# Patient Record
Sex: Female | Born: 1935 | Race: White | Hispanic: No | Smoking: Never smoker
Health system: Southern US, Community
[De-identification: ages and names within clinical notes are randomized; demographics above are authoritative.]

## PROBLEM LIST (undated history)

## (undated) DIAGNOSIS — M25512 Pain in left shoulder: Secondary | ICD-10-CM

## (undated) DIAGNOSIS — M47816 Spondylosis without myelopathy or radiculopathy, lumbar region: Secondary | ICD-10-CM

## (undated) DIAGNOSIS — H18509 Unspecified hereditary corneal dystrophies, unspecified eye: Secondary | ICD-10-CM

## (undated) DIAGNOSIS — T50Z95A Adverse effect of other vaccines and biological substances, initial encounter: Secondary | ICD-10-CM

## (undated) DIAGNOSIS — E039 Hypothyroidism, unspecified: Secondary | ICD-10-CM

## (undated) DIAGNOSIS — M1990 Unspecified osteoarthritis, unspecified site: Secondary | ICD-10-CM

## (undated) DIAGNOSIS — I10 Essential (primary) hypertension: Secondary | ICD-10-CM

## (undated) DIAGNOSIS — E785 Hyperlipidemia, unspecified: Secondary | ICD-10-CM

## (undated) DIAGNOSIS — Z9289 Personal history of other medical treatment: Secondary | ICD-10-CM

## (undated) DIAGNOSIS — H1850 Unspecified hereditary corneal dystrophies: Secondary | ICD-10-CM

## (undated) DIAGNOSIS — H04129 Dry eye syndrome of unspecified lacrimal gland: Secondary | ICD-10-CM

## (undated) DIAGNOSIS — K5790 Diverticulosis of intestine, part unspecified, without perforation or abscess without bleeding: Secondary | ICD-10-CM

## (undated) DIAGNOSIS — H524 Presbyopia: Secondary | ICD-10-CM

## (undated) DIAGNOSIS — H3530 Unspecified macular degeneration: Secondary | ICD-10-CM

## (undated) HISTORY — DX: Unspecified macular degeneration: H35.30

## (undated) HISTORY — DX: Dry eye syndrome of unspecified lacrimal gland: H04.129

## (undated) HISTORY — PX: CATARACT EXTRACTION, BILATERAL: SHX1313

## (undated) HISTORY — DX: Presbyopia: H52.4

## (undated) HISTORY — DX: Hypothyroidism, unspecified: E03.9

## (undated) HISTORY — DX: Essential (primary) hypertension: I10

## (undated) HISTORY — DX: Hyperlipidemia, unspecified: E78.5

## (undated) HISTORY — PX: COLONOSCOPY: SHX174

## (undated) HISTORY — DX: Unspecified hereditary corneal dystrophies, unspecified eye: H18.509

## (undated) HISTORY — DX: Unspecified osteoarthritis, unspecified site: M19.90

## (undated) HISTORY — DX: Personal history of other medical treatment: Z92.89

## (undated) HISTORY — DX: Unspecified hereditary corneal dystrophies: H18.50

## (undated) HISTORY — DX: Pain in left shoulder: M25.512

## (undated) HISTORY — PX: COLECTOMY: SHX59

## (undated) HISTORY — DX: Diverticulosis of intestine, part unspecified, without perforation or abscess without bleeding: K57.90

## (undated) HISTORY — DX: Adverse effect of other vaccines and biological substances, initial encounter: T50.Z95A

## (undated) HISTORY — PX: OTHER SURGICAL HISTORY: SHX169

---

## 1957-11-05 DIAGNOSIS — Z9289 Personal history of other medical treatment: Secondary | ICD-10-CM

## 1957-11-05 HISTORY — PX: APPENDECTOMY: SHX54

## 1957-11-05 HISTORY — DX: Personal history of other medical treatment: Z92.89

## 1973-11-05 HISTORY — PX: VAGINAL HYSTERECTOMY: SUR661

## 1998-05-05 ENCOUNTER — Ambulatory Visit (HOSPITAL_COMMUNITY): Admission: RE | Admit: 1998-05-05 | Discharge: 1998-05-05 | Payer: Self-pay | Admitting: Family Medicine

## 1999-05-22 ENCOUNTER — Other Ambulatory Visit: Admission: RE | Admit: 1999-05-22 | Discharge: 1999-05-22 | Payer: Self-pay | Admitting: Family Medicine

## 2000-05-23 ENCOUNTER — Other Ambulatory Visit: Admission: RE | Admit: 2000-05-23 | Discharge: 2000-05-23 | Payer: Self-pay | Admitting: Family Medicine

## 2000-08-12 ENCOUNTER — Encounter: Payer: Self-pay | Admitting: Family Medicine

## 2000-08-12 ENCOUNTER — Encounter: Admission: RE | Admit: 2000-08-12 | Discharge: 2000-08-12 | Payer: Self-pay | Admitting: Family Medicine

## 2001-05-27 ENCOUNTER — Other Ambulatory Visit: Admission: RE | Admit: 2001-05-27 | Discharge: 2001-05-27 | Payer: Self-pay | Admitting: Family Medicine

## 2001-08-13 ENCOUNTER — Encounter: Payer: Self-pay | Admitting: Family Medicine

## 2001-08-13 ENCOUNTER — Encounter: Admission: RE | Admit: 2001-08-13 | Discharge: 2001-08-13 | Payer: Self-pay | Admitting: Family Medicine

## 2001-10-07 ENCOUNTER — Encounter: Payer: Self-pay | Admitting: Family Medicine

## 2001-10-07 ENCOUNTER — Encounter: Admission: RE | Admit: 2001-10-07 | Discharge: 2001-10-07 | Payer: Self-pay | Admitting: Family Medicine

## 2001-11-14 ENCOUNTER — Inpatient Hospital Stay (HOSPITAL_COMMUNITY): Admission: EM | Admit: 2001-11-14 | Discharge: 2001-11-20 | Payer: Self-pay | Admitting: Emergency Medicine

## 2001-11-14 ENCOUNTER — Encounter: Payer: Self-pay | Admitting: Gastroenterology

## 2001-11-19 ENCOUNTER — Encounter: Payer: Self-pay | Admitting: Gastroenterology

## 2001-12-01 ENCOUNTER — Encounter: Payer: Self-pay | Admitting: Gastroenterology

## 2001-12-01 ENCOUNTER — Ambulatory Visit (HOSPITAL_COMMUNITY): Admission: RE | Admit: 2001-12-01 | Discharge: 2001-12-01 | Payer: Self-pay | Admitting: Gastroenterology

## 2001-12-24 ENCOUNTER — Encounter: Admission: RE | Admit: 2001-12-24 | Discharge: 2002-01-02 | Payer: Self-pay | Admitting: *Deleted

## 2001-12-29 ENCOUNTER — Encounter (INDEPENDENT_AMBULATORY_CARE_PROVIDER_SITE_OTHER): Payer: Self-pay

## 2001-12-29 ENCOUNTER — Inpatient Hospital Stay (HOSPITAL_COMMUNITY): Admission: RE | Admit: 2001-12-29 | Discharge: 2002-01-03 | Payer: Self-pay | Admitting: *Deleted

## 2002-05-28 ENCOUNTER — Other Ambulatory Visit: Admission: RE | Admit: 2002-05-28 | Discharge: 2002-05-28 | Payer: Self-pay | Admitting: Family Medicine

## 2002-07-23 ENCOUNTER — Encounter: Payer: Self-pay | Admitting: Family Medicine

## 2002-07-23 ENCOUNTER — Encounter: Admission: RE | Admit: 2002-07-23 | Discharge: 2002-07-23 | Payer: Self-pay | Admitting: Family Medicine

## 2002-08-18 ENCOUNTER — Encounter: Admission: RE | Admit: 2002-08-18 | Discharge: 2002-08-18 | Payer: Self-pay | Admitting: Family Medicine

## 2002-08-18 ENCOUNTER — Encounter: Payer: Self-pay | Admitting: Family Medicine

## 2002-08-31 ENCOUNTER — Encounter (INDEPENDENT_AMBULATORY_CARE_PROVIDER_SITE_OTHER): Payer: Self-pay

## 2002-08-31 ENCOUNTER — Encounter: Payer: Self-pay | Admitting: Endocrinology

## 2002-08-31 ENCOUNTER — Ambulatory Visit (HOSPITAL_COMMUNITY): Admission: RE | Admit: 2002-08-31 | Discharge: 2002-08-31 | Payer: Self-pay | Admitting: Endocrinology

## 2002-10-27 ENCOUNTER — Encounter: Payer: Self-pay | Admitting: Family Medicine

## 2002-10-27 ENCOUNTER — Encounter: Admission: RE | Admit: 2002-10-27 | Discharge: 2002-10-27 | Payer: Self-pay | Admitting: Family Medicine

## 2003-04-29 ENCOUNTER — Ambulatory Visit (HOSPITAL_COMMUNITY): Admission: RE | Admit: 2003-04-29 | Discharge: 2003-04-29 | Payer: Self-pay | Admitting: Endocrinology

## 2003-04-29 ENCOUNTER — Encounter: Payer: Self-pay | Admitting: Endocrinology

## 2003-05-31 ENCOUNTER — Other Ambulatory Visit: Admission: RE | Admit: 2003-05-31 | Discharge: 2003-05-31 | Payer: Self-pay | Admitting: Family Medicine

## 2003-08-20 ENCOUNTER — Encounter: Payer: Self-pay | Admitting: Family Medicine

## 2003-08-20 ENCOUNTER — Encounter: Admission: RE | Admit: 2003-08-20 | Discharge: 2003-08-20 | Payer: Self-pay | Admitting: Family Medicine

## 2004-04-24 ENCOUNTER — Ambulatory Visit (HOSPITAL_COMMUNITY): Admission: RE | Admit: 2004-04-24 | Discharge: 2004-04-24 | Payer: Self-pay | Admitting: Endocrinology

## 2004-05-31 ENCOUNTER — Other Ambulatory Visit: Admission: RE | Admit: 2004-05-31 | Discharge: 2004-05-31 | Payer: Self-pay | Admitting: Family Medicine

## 2004-08-21 ENCOUNTER — Ambulatory Visit (HOSPITAL_COMMUNITY): Admission: RE | Admit: 2004-08-21 | Discharge: 2004-08-21 | Payer: Self-pay | Admitting: Family Medicine

## 2005-04-25 ENCOUNTER — Ambulatory Visit (HOSPITAL_COMMUNITY): Admission: RE | Admit: 2005-04-25 | Discharge: 2005-04-25 | Payer: Self-pay | Admitting: Endocrinology

## 2005-06-05 ENCOUNTER — Other Ambulatory Visit: Admission: RE | Admit: 2005-06-05 | Discharge: 2005-06-05 | Payer: Self-pay | Admitting: Family Medicine

## 2005-08-22 ENCOUNTER — Ambulatory Visit (HOSPITAL_COMMUNITY): Admission: RE | Admit: 2005-08-22 | Discharge: 2005-08-22 | Payer: Self-pay | Admitting: Family Medicine

## 2006-07-23 ENCOUNTER — Ambulatory Visit: Payer: Self-pay | Admitting: Gastroenterology

## 2006-08-06 ENCOUNTER — Ambulatory Visit: Payer: Self-pay | Admitting: Gastroenterology

## 2006-09-02 ENCOUNTER — Ambulatory Visit (HOSPITAL_COMMUNITY): Admission: RE | Admit: 2006-09-02 | Discharge: 2006-09-02 | Payer: Self-pay | Admitting: Family Medicine

## 2007-09-04 ENCOUNTER — Ambulatory Visit (HOSPITAL_COMMUNITY): Admission: RE | Admit: 2007-09-04 | Discharge: 2007-09-04 | Payer: Self-pay | Admitting: Family Medicine

## 2008-07-19 ENCOUNTER — Encounter: Payer: Self-pay | Admitting: Internal Medicine

## 2008-09-06 ENCOUNTER — Ambulatory Visit (HOSPITAL_COMMUNITY): Admission: RE | Admit: 2008-09-06 | Discharge: 2008-09-06 | Payer: Self-pay | Admitting: Family Medicine

## 2008-09-10 ENCOUNTER — Encounter (INDEPENDENT_AMBULATORY_CARE_PROVIDER_SITE_OTHER): Payer: Self-pay | Admitting: *Deleted

## 2008-10-22 ENCOUNTER — Encounter: Payer: Self-pay | Admitting: Internal Medicine

## 2008-10-26 ENCOUNTER — Ambulatory Visit: Payer: Self-pay | Admitting: Internal Medicine

## 2008-10-26 DIAGNOSIS — I10 Essential (primary) hypertension: Secondary | ICD-10-CM

## 2008-10-26 DIAGNOSIS — E039 Hypothyroidism, unspecified: Secondary | ICD-10-CM | POA: Insufficient documentation

## 2008-10-26 DIAGNOSIS — M1990 Unspecified osteoarthritis, unspecified site: Secondary | ICD-10-CM | POA: Insufficient documentation

## 2008-10-26 DIAGNOSIS — E049 Nontoxic goiter, unspecified: Secondary | ICD-10-CM | POA: Insufficient documentation

## 2008-10-26 DIAGNOSIS — E785 Hyperlipidemia, unspecified: Secondary | ICD-10-CM | POA: Insufficient documentation

## 2008-11-01 ENCOUNTER — Encounter: Admission: RE | Admit: 2008-11-01 | Discharge: 2008-11-01 | Payer: Self-pay | Admitting: Internal Medicine

## 2008-11-02 ENCOUNTER — Telehealth (INDEPENDENT_AMBULATORY_CARE_PROVIDER_SITE_OTHER): Payer: Self-pay | Admitting: *Deleted

## 2009-06-07 ENCOUNTER — Telehealth (INDEPENDENT_AMBULATORY_CARE_PROVIDER_SITE_OTHER): Payer: Self-pay | Admitting: *Deleted

## 2009-08-24 ENCOUNTER — Ambulatory Visit: Payer: Self-pay | Admitting: Internal Medicine

## 2009-08-24 LAB — CONVERTED CEMR LAB
Blood in Urine, dipstick: NEGATIVE
Glucose, Urine, Semiquant: NEGATIVE
Ketones, urine, test strip: NEGATIVE
Nitrite: NEGATIVE
Protein, U semiquant: NEGATIVE
WBC Urine, dipstick: NEGATIVE
pH: 6.5

## 2009-08-25 ENCOUNTER — Encounter (INDEPENDENT_AMBULATORY_CARE_PROVIDER_SITE_OTHER): Payer: Self-pay | Admitting: *Deleted

## 2009-08-25 LAB — CONVERTED CEMR LAB
ALT: 19 units/L (ref 0–35)
AST: 22 units/L (ref 0–37)
Albumin: 3.9 g/dL (ref 3.5–5.2)
BUN: 20 mg/dL (ref 6–23)
Basophils Absolute: 0 10*3/uL (ref 0.0–0.1)
Basophils Relative: 0.1 % (ref 0.0–3.0)
Calcium: 9.5 mg/dL (ref 8.4–10.5)
Creatinine, Ser: 0.8 mg/dL (ref 0.4–1.2)
Eosinophils Relative: 2.6 % (ref 0.0–5.0)
GFR calc non Af Amer: 74.67 mL/min (ref >60)
Glucose, Bld: 102 mg/dL — ABNORMAL HIGH (ref 70–99)
HCT: 44 % (ref 36.0–46.0)
HDL: 60.4 mg/dL (ref >39.00)
Hemoglobin: 15 g/dL (ref 12.0–15.0)
LDL Cholesterol: 102 mg/dL — ABNORMAL HIGH (ref 0–99)
Lymphocytes Relative: 41.1 % (ref 12.0–46.0)
Lymphs Abs: 2.3 10*3/uL (ref 0.7–4.0)
Monocytes Absolute: 0.5 10*3/uL (ref 0.1–1.0)
Monocytes Relative: 9.4 % (ref 3.0–12.0)
Neutro Abs: 2.6 10*3/uL (ref 1.4–7.7)
Platelets: 214 10*3/uL (ref 150.0–400.0)
Potassium: 4.1 meq/L (ref 3.5–5.1)
RBC: 4.67 M/uL (ref 3.87–5.11)
Sodium: 143 meq/L (ref 135–145)
TSH: 0.55 microintl units/mL (ref 0.35–5.50)
Total Bilirubin: 0.9 mg/dL (ref 0.3–1.2)
Triglycerides: 83 mg/dL (ref 0.0–149.0)
VLDL: 16.6 mg/dL (ref 0.0–40.0)
WBC: 5.5 10*3/uL (ref 4.5–10.5)

## 2009-08-31 ENCOUNTER — Ambulatory Visit: Payer: Self-pay | Admitting: Internal Medicine

## 2009-08-31 DIAGNOSIS — N959 Unspecified menopausal and perimenopausal disorder: Secondary | ICD-10-CM | POA: Insufficient documentation

## 2009-09-14 ENCOUNTER — Ambulatory Visit (HOSPITAL_COMMUNITY): Admission: RE | Admit: 2009-09-14 | Discharge: 2009-09-14 | Payer: Self-pay | Admitting: Internal Medicine

## 2009-10-24 ENCOUNTER — Ambulatory Visit: Payer: Self-pay | Admitting: Internal Medicine

## 2009-11-01 ENCOUNTER — Encounter (INDEPENDENT_AMBULATORY_CARE_PROVIDER_SITE_OTHER): Payer: Self-pay | Admitting: *Deleted

## 2010-03-06 ENCOUNTER — Ambulatory Visit: Payer: Self-pay | Admitting: Internal Medicine

## 2010-08-18 ENCOUNTER — Telehealth (INDEPENDENT_AMBULATORY_CARE_PROVIDER_SITE_OTHER): Payer: Self-pay | Admitting: *Deleted

## 2010-09-08 ENCOUNTER — Telehealth: Payer: Self-pay | Admitting: Internal Medicine

## 2010-09-18 ENCOUNTER — Ambulatory Visit (HOSPITAL_COMMUNITY): Admission: RE | Admit: 2010-09-18 | Discharge: 2010-09-18 | Payer: Self-pay | Admitting: Internal Medicine

## 2010-10-05 ENCOUNTER — Ambulatory Visit: Payer: Self-pay | Admitting: Internal Medicine

## 2010-10-05 LAB — CONVERTED CEMR LAB
Bilirubin Urine: NEGATIVE
Blood in Urine, dipstick: NEGATIVE
Ketones, urine, test strip: NEGATIVE
Nitrite: NEGATIVE
Urobilinogen, UA: 0.2

## 2010-10-10 LAB — CONVERTED CEMR LAB
AST: 19 units/L (ref 0–37)
Albumin: 3.7 g/dL (ref 3.5–5.2)
Alkaline Phosphatase: 73 units/L (ref 39–117)
Basophils Relative: 0.8 % (ref 0.0–3.0)
Calcium: 9.2 mg/dL (ref 8.4–10.5)
Chloride: 96 meq/L (ref 96–112)
Cholesterol: 183 mg/dL (ref 0–200)
Creatinine, Ser: 0.8 mg/dL (ref 0.4–1.2)
Eosinophils Relative: 2.2 % (ref 0.0–5.0)
HDL: 55.2 mg/dL (ref >39.00)
Hemoglobin: 14.2 g/dL (ref 12.0–15.0)
Hgb A1c MFr Bld: 6.7 % — ABNORMAL HIGH (ref 4.6–6.5)
LDL Cholesterol: 108 mg/dL — ABNORMAL HIGH (ref 0–99)
Lymphocytes Relative: 34.7 % (ref 12.0–46.0)
Neutro Abs: 3.2 10*3/uL (ref 1.4–7.7)
Neutrophils Relative %: 54.2 % (ref 43.0–77.0)
RBC: 4.54 M/uL (ref 3.87–5.11)
Sodium: 139 meq/L (ref 135–145)
Total CHOL/HDL Ratio: 3
Total Protein: 6.4 g/dL (ref 6.0–8.3)
Triglycerides: 98 mg/dL (ref 0.0–149.0)
WBC: 5.9 10*3/uL (ref 4.5–10.5)

## 2010-10-17 ENCOUNTER — Ambulatory Visit: Payer: Self-pay | Admitting: Internal Medicine

## 2010-10-17 ENCOUNTER — Encounter: Payer: Self-pay | Admitting: Internal Medicine

## 2010-10-17 ENCOUNTER — Encounter (INDEPENDENT_AMBULATORY_CARE_PROVIDER_SITE_OTHER): Payer: Self-pay | Admitting: *Deleted

## 2010-10-17 DIAGNOSIS — E119 Type 2 diabetes mellitus without complications: Secondary | ICD-10-CM

## 2010-10-17 LAB — CONVERTED CEMR LAB
OCCULT 1: NEGATIVE
OCCULT 2: NEGATIVE
OCCULT 3: NEGATIVE

## 2010-11-26 ENCOUNTER — Encounter: Payer: Self-pay | Admitting: Endocrinology

## 2010-12-05 NOTE — Progress Notes (Signed)
Summary: ORDERS FOR LABS  Phone Note Call from Patient   Caller: Patient Summary of Call: NEED ODERS FOR LABS WORK TO PUT ON THE DATE Initial call taken by: Freddy Jaksch,  August 18, 2010 3:14 PM  Follow-up for Phone Call        Patient is due for CPX labs. See below. Lucious Groves CMA  August 18, 2010 3:46 PM   Additional Follow-up for Phone Call Additional follow up Details #1::        LIPID/HEP/BMP/CBCD/TSH/U-DIP/STOOL CARDS V70.0,244.9,401.9,272.4  Copied from 06/07/2009 phone note requesting orders Additional Follow-up by: Shonna Chock CMA,  August 18, 2010 3:44 PM

## 2010-12-05 NOTE — Assessment & Plan Note (Signed)
Summary: rash on leg/cbs   Vital Signs:  Patient profile:   77 year old female Weight:      161.6 pounds Temp:     98.3 degrees F oral Pulse rate:   84 / minute BP sitting:   114 / 70  (left arm) Cuff size:   large  Vitals Entered By: Shonna Chock (Mar 06, 2010 10:21 AM) CC: Rash on left upper leg x 1 week, not itchy just red and drainage present Comments REVIEWED MED LIST, PATIENT AGREED DOSE AND INSTRUCTION CORRECT    CC:  Rash on left upper leg x 1 week and not itchy just red and drainage present.  History of Present Illness: Rash present X 1 week; ? drier post Caladryl , Benadryl & ? Mometasone .She used Neosporin topically several times, "6-8". There was a tiny central ulcer which drained clear secretions.  Allergies: 1)  ! Pcn 2)  ! * Any Cillins  Review of Systems General:  Denies chills, fever, and sweats. Derm:  Complains of changes in color of skin and rash; denies itching; Also small patch of poison ivy LUE.  Physical Exam  General:  well-nourished,in no acute distress; alert,appropriate and cooperative throughout examination Skin:  7X5 cm rough plaque LU thigh w/o temp change. 1X3 mm contact dermatitis L ant elbow  area Cervical Nodes:  No lymphadenopathy noted   Impression & Recommendations:  Problem # 1:  RASH-NONVESICULAR (ICD-782.1)  component of contact drmatitis from Neosporin  Her updated medication list for this problem includes:    Mometasone Furoate 0.1 % Soln (Mometasone furoate) .Marland Kitchen... Apply two times a day after saline soaking  Complete Medication List: 1)  Adult Aspirin Low Strength 81 Mg Tbdp (Aspirin) .Marland Kitchen.. 1 by mouth once daily 2)  Levothroid 50 Mcg Tabs (Levothyroxine sodium) .Marland Kitchen.. 1 by mouth once daily 3)  Methyclothiazide 5 Mg Tabs (Methyclothiazide) .Marland Kitchen.. 1 by mouth once daily 4)  Lipitor 10 Mg Tabs (Atorvastatin calcium) .Marland Kitchen.. 1 by mouth at bedtime 5)  Altace 5 Mg Caps (Ramipril) .Marland Kitchen.. 1 by mouth once daily 6)  Restasis 0.05 % Emul  (Cyclosporine) .Marland Kitchen.. 1 drop in each eye am and pm 7)  Mometasone Furoate 0.1 % Soln (Mometasone furoate) .... Apply two times a day after saline soaking  Patient Instructions: 1)  Saline gauze, "wet dry"  to leg rash two times a day followed by Mometasone cream. Report pain ,redness or fever. Prescriptions: MOMETASONE FUROATE 0.1 % SOLN (MOMETASONE FUROATE) apply two times a day after saline soaking  #30 grams x 0   Entered and Authorized by:   Marga Melnick MD   Signed by:   Marga Melnick MD on 03/06/2010   Method used:   Print then Give to Patient   RxID:   [id number]

## 2010-12-05 NOTE — Progress Notes (Signed)
Summary: Refill  Phone Note Refill Request Message from:  Fax from Pharmacy on September 08, 2010 8:24 AM  Refills Requested: Medication #1:  METHYCLOTHIAZIDE 5 MG TABS 1 by mouth once daily [facility] home delivery - fax [phone]  Initial call taken by: Okey Regal Spring,  September 08, 2010 8:25 AM  Follow-up for Phone Call        please check ; I believe it is a diuretic. If diuretic ; refill X 90 days Follow-up by: Marga Melnick MD,  September 12, 2010 5:17 AM  Additional Follow-up for Phone Call Additional follow up Details #1::        RX faxed to: [phone]  Additional Follow-up by: Shonna Chock CMA,  September 12, 2010 2:20 PM    Prescriptions: METHYCLOTHIAZIDE 5 MG TABS (METHYCLOTHIAZIDE) 1 by mouth once daily  #90 x 0   Entered by:   Shonna Chock CMA   Authorized by:   Marga Melnick MD   Signed by:   Shonna Chock CMA on 09/12/2010   Method used:   Print then Give to Patient   RxID:   [id number]

## 2010-12-07 NOTE — Letter (Signed)
Summary: Results Follow up Letter  [facility]  [address] [place], [place] [address]   Phone: [phone]  Fax: [phone]    10/17/2010 MRN: [id number]  Shannon Greene [address] [place], [place]  [address]  Dear Ms. Palmieri,  The following are the results of your recent test(s):  Test         Result    Pap Smear:        Normal _____  Not Normal _____ Comments: ______________________________________________________ Cholesterol: LDL(Bad cholesterol):         Your goal is less than:         HDL (Good cholesterol):       Your goal is more than: Comments:  ______________________________________________________ Mammogram:        Normal _____  Not Normal _____ Comments:  ___________________________________________________________________ Hemoccult:        Normal _X____  Not normal _______ Comments:    _____________________________________________________________________ Other Tests:    We routinely do not discuss normal results over the telephone.  If you desire a copy of the results, or you have any questions about this information we can discuss them at your next office visit.   Sincerely,

## 2010-12-07 NOTE — Assessment & Plan Note (Signed)
Summary: YEARLY/PH   Vital Signs:  Patient profile:   77 year old female Height:      66 inches Weight:      160.6 pounds BMI:     26.02 Temp:     98.2 degrees F oral Pulse rate:   80 / minute Resp:     14 per minute BP sitting:   118 / 76  (left arm) Cuff size:   large  Vitals Entered By: Shonna Chock CMA (October 17, 2010 11:11 AM) CC: Wellness check, Lipid Management Comments Patient states immunizations UTD   CC:  Wellness check and Lipid Management.  History of Present Illness: Here for Medicare AWV: 1.Risk factors based on Past M, S, F history:Hypothyroidism; HTN;Dyslipidemia( chart updated) 2.Physical Activities: walking 3X/ week X 30 min 3.Depression/mood: no issues 4.Hearing:whisper heard @ 6 ft  5.ADL's: no limnitations 6.Fall Risk: no issues 7.Home Safety: safety proofed 8.Height, weight, &visual acuity:wall chart read @ 6 ft with lenses 9.Counseling: POA & Living Will in place 10.Labs ordered based on risk factors: results reviewed 11. Referral Coordination; none requested 12. Care Plan: see Instructions 13.Cognitive Assessment: Oriented X3; memory & recall excellent   ; "WORLD" spelled backwards ; mood & affect  normal . Hyperlipidemia Follow-Up: The patient denies muscle aches, GI upset, abdominal pain, flushing, itching, constipation, diarrhea, and fatigue.  The patient denies the following symptoms: chest pain/pressure, exercise intolerance, dypsnea, palpitations, syncope, and pedal edema.  Compliance with medications (by patient report) has been near 100%.  Dietary compliance has been excellent.  Adjunctive measures currently used by the patient include ASA.   Hypertension Follow-Up: The patient denies lightheadedness, urinary frequency, and headaches.  Compliance with medications (by patient report) has been near 100%.  Adjunctive measures currently used by the patient include salt restriction.  BP not checked @ home.  Lipid Management History:  Positive NCEP/ATP III risk factors include female age 26 years old or older, diabetes, and hypertension.  Negative NCEP/ATP III risk factors include no history of early menopause without estrogen hormone replacement, no family history for ischemic heart disease, non-tobacco-user status, no ASHD (atherosclerotic heart disease), no prior stroke/TIA, no peripheral vascular disease, and no history of aortic aneurysm.     Preventive Screening-Counseling & Management  Alcohol-Tobacco     Alcohol drinks/day: 0     Smoking Status: never  Caffeine-Diet-Exercise     Caffeine use/day: none  Hep-HIV-STD-Contraception     Dental Visit-last 6 months yes     Sun Exposure-Excessive: no  Safety-Violence-Falls     Seat Belt Use: yes     Smoke Detectors: yes      Blood Transfusions:  yes, prior to 1987, and Post C  section bleeding 1959.        Travel History:  [place]  @ age 60.    Current Medications (verified): 1)  Adult Aspirin Low Strength 81 Mg Tbdp (Aspirin) .Marland Kitchen.. 1 By Mouth Once Daily 2)  Levothroid 50 Mcg Tabs (Levothyroxine Sodium) .Marland Kitchen.. 1 By Mouth Once Daily 3)  Methyclothiazide 5 Mg Tabs (Methyclothiazide) .Marland Kitchen.. 1 By Mouth Once Daily 4)  Lipitor 10 Mg Tabs (Atorvastatin Calcium) .Marland Kitchen.. 1 By Mouth At Bedtime 5)  Altace 5 Mg Caps (Ramipril) .Marland Kitchen.. 1 By Mouth Once Daily 6)  Restasis 0.05 % Emul (Cyclosporine) .Marland Kitchen.. 1 Drop in Each Eye Am and Pm  Allergies: 1)  ! Pcn 2)  ! * Any Cillins 3)  ! * Tetanus  Past History:  Past Medical History: Hyperlipidemia: [place] Study LDL  goal = < 130 Hypertension Hypothyroidism, Goiter , PMH of Glaucoma, resolved post op Osteoarthritis  Past Surgical History: Cataract extraction  bilaterally; Glaucoma surgery, Dr Sol Blazing Hysterectomy:(NO oophorectomy) 1975 for fibroids Caesarean section: X 2, 9147,8295; G2 P2;  Colectomy: 18 INCHES REMOVED 2002-2003 for diverticulitis, Dr Colin Benton; Colonoscopy post Colectomy 2007 Appendectomy done with C section  1959, blood transfusion of 2 pints  Family History: Father: DECEASED  in 1943-TB Mother: DECEASED (HTN, HEART ATTACK @ 38) Siblings: 2 BROTHERS (1 DECEASED @ 35 DM, CVA)  Social History: Retired Married Never Smoked Regular exercise-yes Caffeine use/day:  none Dental Care w/in 6 mos.:  yes Sun Exposure-Excessive:  no Risk analyst Use:  yes Blood Transfusions:  yes, prior to 1987, Post C  section bleeding 1959  Review of Systems General:  Denies weight loss. Eyes:  Denies blurring, double vision, and vision loss-both eyes. ENT:  Denies difficulty swallowing and hoarseness. Resp:  Denies cough and sputum productive. GI:  Denies bloody stools and dark tarry stools; No dysphagia. GU:  Denies discharge, dysuria, and hematuria. MS:  Denies joint pain, low back pain, mid back pain, and thoracic pain. Derm:  Denies changes in nail beds, dryness, hair loss, lesion(s), and rash. Neuro:  Denies numbness and tingling. Endo:  Denies cold intolerance, excessive hunger, excessive thirst, excessive urination, and heat intolerance. Heme:  Denies abnormal bruising, bleeding, and enlarge lymph nodes. Allergy:  Denies itching eyes and sneezing.  Physical Exam  General:  well-nourished;alert,appropriate and cooperative throughout examination Head:  Normocephalic and atraumatic without obvious abnormalities.  Eyes:  No corneal or conjunctival inflammation noted. Perrla. Funduscopic exam benign, without hemorrhages, exudates or papilledema. Ears:  External ear exam shows no significant lesions or deformities.  Otoscopic examination reveals clear canals, tympanic membranes are intact bilaterally without bulging, retraction, inflammation or discharge. Hearing is grossly normal bilaterally. Nose:  External nasal examination shows no deformity or inflammation. Nasal mucosa are pink and moist without lesions or exudates. Mouth:  Oral mucosa and oropharynx without lesions or exudates.  Teeth in good  repair. Neck:  No deformities, masses, or tenderness noted. Breasts:  Mammograms 09/2010 Lungs:  Normal respiratory effort, chest expands symmetrically. Lungs are clear to auscultation, no crackles or wheezes. Heart:  Normal rate and regular rhythm. S1 and S2 normal without gallop, murmur, click, rub.S4 Abdomen:  Bowel sounds positive,abdomen soft and non-tender without masses, organomegaly or hernias noted. Genitalia:  S/P TAH ( Note : no bloating) Msk:  No deformity or scoliosis noted of thoracic or lumbar spine.   Pulses:  R and L carotid,radial,dorsalis pedis and posterior tibial pulses are full and equal bilaterally Extremities:  No clubbing, cyanosis, edema. OA finger changes.Ganglion dorsum L foot Neurologic:  alert & oriented X3 and DTRs symmetrical and normal.   Skin:  Lipoma L lateral thigh Cervical Nodes:  No lymphadenopathy noted Axillary Nodes:  No palpable lymphadenopathy Psych:  memory intact for recent and remote, normally interactive, and good eye contact.     Impression & Recommendations:  Problem # 1:  PREVENTIVE HEALTH CARE (ICD-V70.0)  Orders: Medicare -1st Annual Wellness Visit [phone])  Problem # 2:  HYPOTHYROIDISM (ICD-244.9)  Her updated medication list for this problem includes:    Levothroid 50 Mcg Tabs (Levothyroxine sodium) .Marland Kitchen... 1 by mouth once daily  Problem # 3:  HYPERTENSION (ICD-401.9)  controlled Her updated medication list for this problem includes:    Methyclothiazide 5 Mg Tabs (Methyclothiazide) .Marland Kitchen... 1 by mouth once daily    Altace 5  Mg Caps (Ramipril) .Marland Kitchen... 1 by mouth once daily  Orders: EKG w/ Interpretation (93000)  Problem # 4:  HYPERLIPIDEMIA (ICD-272.4) LDL goal = < 130 Her updated medication list for this problem includes:    Lipitor 10 Mg Tabs (Atorvastatin calcium) .Marland Kitchen... 1 by mouth at bedtime  Problem # 5:  DIABETES MELLITUS, TYPE II, CONTROLLED (ICD-250.00) A1c 6.7% Her updated medication list for this problem includes:     Adult Aspirin Low Strength 81 Mg Tbdp (Aspirin) .Marland Kitchen... 1 by mouth once daily    Altace 5 Mg Caps (Ramipril) .Marland Kitchen... 1 by mouth once daily  Complete Medication List: 1)  Adult Aspirin Low Strength 81 Mg Tbdp (Aspirin) .Marland Kitchen.. 1 by mouth once daily 2)  Levothroid 50 Mcg Tabs (Levothyroxine sodium) .Marland Kitchen.. 1 by mouth once daily 3)  Methyclothiazide 5 Mg Tabs (Methyclothiazide) .Marland Kitchen.. 1 by mouth once daily 4)  Lipitor 10 Mg Tabs (Atorvastatin calcium) .Marland Kitchen.. 1 by mouth at bedtime 5)  Altace 5 Mg Caps (Ramipril) .Marland Kitchen.. 1 by mouth once daily 6)  Restasis 0.05 % Emul (Cyclosporine) .Marland Kitchen.. 1 drop in each eye am and pm  Lipid Assessment/Plan:      Based on NCEP/ATP III, the patient's risk factor category is "history of diabetes".  The patient's lipid goals are as follows: Total cholesterol goal is 200; LDL cholesterol goal is 130; HDL cholesterol goal is 40; Triglyceride goal is 150.    Patient Instructions: 1)  Check your Blood Pressure regularly. If it is above: 135/85 ON AVERAGE  you should make an appointment. Review The Sugar Busters book  for low carb , low sugar diet. 2)  Please schedule a follow-up appointment in 3 months. 3)  TSH prior to visit, ICD-9:244.9 4)  HbgA1C prior to visit, ICD-9:250.00 5)  Urine Microalbumin prior to visit, ICD-9:250.00  Prescriptions: ALTACE 5 MG CAPS (RAMIPRIL) 1 by mouth once daily  #90 x 3   Entered and Authorized by:   Marga Melnick MD   Signed by:   Marga Melnick MD on 10/17/2010   Method used:   Print then Give to Patient   RxID:   [id number] LIPITOR 10 MG TABS (ATORVASTATIN CALCIUM) 1 by mouth at bedtime  #90 x 3   Entered and Authorized by:   Marga Melnick MD   Signed by:   Marga Melnick MD on 10/17/2010   Method used:   Print then Give to Patient   RxID:   [id number] METHYCLOTHIAZIDE 5 MG TABS (METHYCLOTHIAZIDE) 1 by mouth once daily  #90 x 3   Entered and Authorized by:   Marga Melnick MD   Signed by:   Marga Melnick MD on 10/17/2010   Method  used:   Print then Give to Patient   RxID:   [id number] LEVOTHROID 50 MCG TABS (LEVOTHYROXINE SODIUM) 1 by mouth once daily  #90 Each x 3   Entered and Authorized by:   Marga Melnick MD   Signed by:   Marga Melnick MD on 10/17/2010   Method used:   Print then Give to Patient   RxID:   [id number]    Orders Added: 1)  Medicare -1st Annual Wellness Visit [G0438] 2)  Est. Patient Level III [42595] 3)  EKG w/ Interpretation [93000]

## 2011-03-23 NOTE — Op Note (Signed)
[facility]  Patient:    Shannon Greene, Shannon Greene Visit Number: [id number] MRN: [id number]          Service Type: SUR Location: 4W [id number] 01 Attending Physician:  Vikki Ports Dictated by:   Vikki Ports, M.D. Proc. Date: 12/29/01 Admit Date:  12/29/2001   CC:         Venita Lick. Pleas Koch., M.D. [facility]  Ulyess Mort, M.D. [facility] C. Andrey Campanile, M.D.   Operative Report  2PREOPERATIVE DIAGNOSIS:  Recurrent diverticulitis.  POSTOPERATIVE DIAGNOSIS:  Recurrent diverticulitis.  PROCEDURE:  Sigmoid colectomy.  SURGEON:  Vikki Ports, M.D.  ASSISTANT:  Sandria Bales. Ezzard Standing, M.D.  ANESTHESIA:  General.  DESCRIPTION OF PROCEDURE:  The patient was taken to the operating room, placed in the supine position. After adequate general anesthesia was induced using endotracheal tube, Foley catheter was placed. The patient was placed in lithotomy position. Abdomen and perineum were prepped and draped in normal sterile fashion. Using a vertical midline incision, I dissected down to the fascia. The fascia was opened vertically and the peritoneum was entered. A number of dense adhesions of the omentum to the anterior abdominal wall were taken down using both blunt and sharp dissection. The left colon was identified. There was a large inflammatory mass very adherent to the tubular remnants in the pelvis. These were dissected off of the pelvic sidewalls, left ureter was visualized and followed, and great care was taken not to injure it. After the inflamed portion of the sigmoid colon was completely released of its dense adhesive attachments, it was divided at the proximal sigmoid colon. The distal sigmoid colon was divided right at the rectosigmoid junction using a GIA 75 stapling device. A side-to-side functional end-to-end anastomosis was created in a standard fashion using a GIA 75 stapler and a TA 60 to close the remaining defect. The colon  was intact to the peritoneum laterally and mesenteric defect was closed. Left ureter was again visualized, bladder was inspected, abdomen was copiously irrigated, no other pathology was noted. The fascia was closed with a running #1 Novafil and skin was closed with staples. The patient tolerated the procedure well and went to PACU in good condition. Dictated by:   Vikki Ports, M.D. Attending Physician:  Vikki Ports DD:  12/29/01 TD:  12/29/01 Job: [id number]

## 2011-03-23 NOTE — H&P (Signed)
[facility]  Patient:    Shannon Greene, HUND Visit Number: [id number] MRN: [id number]          Service Type: Attending:  Ulyess Mort, M.D. [facility] Dictated by:   Sammuel Cooper, P.A.C. Adm. Date:  11/14/01   CC:         Duffy Rhody C. Andrey Campanile, M.D.   History and Physical  DATE OF BIRTH:  [birth year]  CHIEF COMPLAINT:  Diverticulitis.  HISTORY OF PRESENT ILLNESS:  The patient is a pleasant 77 year old white female with a history of recurrent diverticulitis over the past couple of years.  She has now had a recurrent episode which has been persistent since early December.  She had one course of Cipro x 10 days and was restarted on Cipro on November 10, 2001, for persistent pain.  She describes ongoing left lower quadrant pain and now has had low-grade temperatures at home, 100.4 at max.  She has not had any chills or sweats.  No nausea or vomiting.  She says her appetite has been poor, and she has been on liquids over the past week. She has been having some difficulty with constipation which is chronic, but no melena or hematochezia.  She describes increased discomfort in her lower abdomen with movement and sitting.  She had undergone abdominal ultrasound on October 07, 2001, which showed an unusual soft-tissue thickening in the midpelvis, etiology of which was unclear, and had CT scan of the abdomen and pelvis today at [facility] which did show evidence of diverticulitis, with a 4.2 cm phlegmon versus abscess adjacent to the sigmoid colon and bladder base.  The patient was seen and evaluated in the office today and is admitted now for IV antibiotics, bowel rest, and surgical consultation.  The patient had undergone colonoscopy in November 2000, which showed moderately severe diverticular disease with a partial obstructive component in the descending colon.  There was no evidence of diverticulitis at that time. She was also noted to have a small  rectocele.  Barium enema had been done remotely showing spasm and edema of the sigmoid colon consistent with diverticulitis.  CURRENT MEDICATIONS: 1. Blood pressure medicine, uncertain of the name. 2. Aspirin 81 mg q.d. 3. Zocor 20 p.o. q.d. 4. Cipro 500 b.i.d.  ALLERGIES:  PENICILLIN.  PAST MEDICAL HISTORY: 1. Hypertension. 2. Hyperlipidemia. 3. Status post hysterectomy in 1974. 4. C-section x 2. 5. Appendectomy in 1959.  FAMILY HISTORY:  Father deceased with TB.  Mother deceased with an MI. Brother deceased with CVA and coronary artery disease.  SOCIAL HISTORY:  The patient is married.  No tobacco, no ETOH.  She is retired.  REVIEW OF SYSTEMS:  CARDIOVASCULAR:  Denies any chest pain or anginal symptoms.  PULMONARY:  Negative for cough, shortness of breath, or sputum production.  GENITOURINARY:  No current dysuria, urgency, or frequency.  PHYSICAL EXAMINATION:  GENERAL:  Well-developed white female in no acute distress.  VITAL SIGNS:  Blood pressure 126/68, pulse 84, respirations 16.  HEENT:  Atraumatic, normocephalic.  EOMI.  PERRLA.  Sclerae anicteric.  NECK:  Supple.  Without nodes.  CARDIOVASCULAR:  Regular rate and rhythm with S1, S2.  LUNGS:  Clear to A&P.  ABDOMEN:  Soft.  Bowel sounds are active.  Tender in the left lower quadrant. No guarding.  Does have some early rebound.  No mass or hepatosplenomegaly.  RECTAL:  Not done today.  EXTREMITIES:  Without clubbing, cyanosis, or edema.  LABORATORY DATA:  Pending.  IMPRESSION: 1. [age]-year-old  white female with recurrent diverticulitis with sigmoid    colon abscess versus phlegmon. 2. Status post hysterectomy, C-section x 2, and appendectomy. 3. Hypertension. 4. Hyperlipidemia.  PLAN:  The patient is admitted to the service of Dr. Victorino Dike for IV fluids, hydration, bowel rest.  She will be covered with IV Cipro and IV Flagyl.  Pain control with Demerol and Phenergan as needed.  Will  obtain surgical consultation and hope for initial conservative management, with repeat CT scan in several days and perhaps an eventual resection due to the recurrent nature of her disease. Dictated by:   Sammuel Cooper, P.A.C. Attending:  Ulyess Mort, M.D. [facility] DD:  11/14/01 TD:  11/15/01 Job: [id number]

## 2011-03-23 NOTE — Discharge Summary (Signed)
[facility]  Patient:    Shannon Greene, BACK Visit Number: [id number] MRN: [id number]          Service Type: SUR Location: 4W [id number] 01 Attending Physician:  Vikki Ports Dictated by:   Vikki Ports, M.D. Admit Date:  12/29/2001 Discharge Date: 01/03/2002                             Discharge Summary  ADMISSION DIAGNOSES: 1. Sigmoid diverticulitis. 2. Hypertension. 3. Glaucoma.  DISCHARGE DIAGNOSES: 1. Sigmoid diverticulitis. 2. Hypertension. 3. Glaucoma.  PROCEDURES:  Sigmoid colectomy.  CONSULTATION: 1. Dr. Claudette Head. 2. Dr. Victorino Dike. 3. Dr. Margrett Rud.  CONDITION ON DISCHARGE:  Good and improved.  DISPOSITION:  The patient is discharged to home.  DISCHARGE MEDICATIONS: 1. Vicodin for pain. 2. Zocor 20 mg one p.o. q.d. 3. Baby aspirin 81 mg.  HISTORY OF PRESENT ILLNESS:  The patient is a 77 year old white female with a chronic history of intermittent diverticulitis, most recently six weeks prior to admission had a small pelvic abscess which was treated with antibiotics and resolved well.  The patient did well, and now presents for elective sigmoid colectomy.  For the remainder of the H&P, please see the chart.  HOSPITAL COURSE:  The patient underwent bowel prep at home, was admitted to the hospital where she underwent sigmoid colectomy.  Of note, the patient was on the Adalor drug study protocols.  Postoperatively, on postoperative day #1, she was offered some sips of clear liquids, was ambulating on postoperative day #1, had stable blood pressure at 160s/90s.  On postoperative day #2, she had a low grade temperature of 100.9, blood pressure was improved down to the 130s, she was still passing no gas and no nausea.  She did have bowel sounds present.  The patient was brought along appropriately.  Her diet was advanced as tolerated over the next 2 to 3 days.  Her IV was hep-welled.  On postoperative day  #4, she was feeling well, tolerating a regular diet, but still felt slightly weak, was watched an additional day, and was ready for discharge on 01/03/02. Dictated by:   Vikki Ports, M.D. Attending Physician:  Danna Hefty R. DD:  02/03/02 TD:  02/03/02 Job: [id number]

## 2011-03-23 NOTE — H&P (Signed)
[facility]  Patient:    Shannon Greene, PAI Visit Number: [id number] MRN: [id number]          Service Type: SUR Location: 1S [id number] Attending Physician:  Vikki Ports Dictated by:   Vikki Ports, M.D. Admit Date:  12/29/2001   CC:         Venita Lick. Pleas Koch., M.D. [facility]  Ulyess Mort, M.D. [facility] C. Andrey Campanile, M.D.   History and Physical  ADMISSION DIAGNOSIS:  Recurrent diverticulosis.  CONSULTING PHYSICIANS: 1. Dr. Judie Petit T. Pleas Koch. 2. Dr. Ulyess Mort. 3. Dr. Duffy Rhody C. Wilson.  HISTORY OF PRESENT ILLNESS:  The patient is a 77 year old white female with recurrent episodes of diverticulitis.  Most recently, she was admitted in January 2003 with exacerbation of her diverticulitis and a small intra-abdominal abscess.  Patient was treated nonoperatively with IV antibiotics and bowel rest.  She tolerated that well and was discharged home on p.o. antibiotics and returned to see me in the office to schedule elective sigmoid colectomy.  PAST MEDICAL HISTORY:  Significant for hypertension, hyperlipidemia, status post hysterectomy in 1974, C-section x2, remote, appendectomy in 1959.  FAMILY HISTORY:  Father is deceased with tuberculosis.  Mother is deceased with myocardial infarction.  Brother is deceased with a CVA and coronary artery disease.  SOCIAL HISTORY:  Patient is married.  No tobacco or alcohol use.  REVIEW OF SYSTEMS:  CARDIOVASCULAR:  Denies any chest pain or anginal symptoms.  PULMONARY:  Negative for cough, shortness of breath or sputum production.  GU:  No current dysuria, urgency or frequency.  PHYSICAL EXAMINATION:  GENERAL:  She is a well-developed white female.  VITAL SIGNS:  Blood pressure is 120/72.  Heart rate is 78.  Respirations are 16.  HEENT:  Normocephalic, atraumatic.  No evidence of scleral icterus.  NECK:  Supple and soft without masses.  CARDIOVASCULAR:  Regular rate  and rhythm without murmurs, rubs, or gallops.  LUNGS:  Clear to auscultation.  ABDOMEN:  Soft.  Minimally tender in the suprapubic region with a well-healed right lower quadrant scar.  RECTAL:  Deferred.  EXTREMITIES:  No clubbing, cyanosis, or edema.  IMPRESSION:  [age]-year-old white female with history of recurrent diverticulitis, for admission, elective sigmoid colectomy.  Of note, the patient will be on the Adelor study. Dictated by:   Vikki Ports, M.D. Attending Physician:  Vikki Ports DD:  12/29/01 TD:  12/29/01 Job: [id number]

## 2011-03-23 NOTE — Discharge Summary (Signed)
[facility]  Patient:    Shannon Greene, Shannon Greene Visit Number: [id number] MRN: [id number]          Service Type: MED Location: [id number] [phone] Attending Physician:  Shannon Greene Dictated by:   Shannon Greene, P.A. Admit Date:  11/14/2001 Discharge Date: 11/20/2001   CC:         Shannon Greene, M.D. [facility]             Shannon Greene, M.D.             Shannon Greene, M.D.                           Discharge Summary  ADMITTING DIAGNOSES: 55. A 77 year old female with recurrent diverticulitis with sigmoid colon    abscess versus phlegmon. 2. Status post hysterectomy, cesarean section x2, and appendectomy. 3. Hypertension. 4. Hyperlipidemia.  DISCHARGE DIAGNOSES: 1. Diverticulitis with early sigmoid abscess, clinically stable and improving. 2. Right upper extremity hematoma post attempted peripherally inserted central    catheter line placement November 19, 2001. 3. Other diagnoses as listed above.  CONSULTATIONS:  Shannon Greene, surgery.  BRIEF HISTORY:  Shannon Greene is a very nice 77 year old white female known to Dr. Victorino Greene who has had recurrent diverticulitis over the past couple of years.  She has now had a recurrent episode which has been persistent since early December 2002.  She had initially had a course of Cipro x10 days and was then restarted on Cipro on November 10, 2001 for persistent pain.  At this time, she describes ongoing left lower quadrant pain, low-grade temperatures at home in the 100.4 range, no chills or sweats, no nausea or vomiting.  The patient feels that her appetite has been poor and she has been basically on liquids at home over the past week.  She has had some difficulty with constipation, which is chronic for her, but no melena or hematochezia.  She feels that she has had increased discomfort with movement and sitting.  She had undergone abdominal ultrasound on October 07, 2001 which showed an unusual soft tissue  thickening in the mid pelvis, and then CT scan of the abdomen and pelvis earlier today at [facility] showed evidence of diverticulitis with a 4.2 cm phlegmon versus early abscess adjacent to the sigmoid colon and bladder base.  The patient was seen and evaluated in the office and admitted for IV antibiotics, bowel rest, and surgical consultation.  The patient had had last colonoscopy in November 2000 which showed moderately severe diverticular disease with a partial obstructive component in the descending colon.  No diverticulitis at that time.  Barium enema remotely had showed sigmoid spasm and edema consistent with diverticulitis.  LABORATORY DATA:  Laboratory studies on January 10:  WBC of 11.4, hemoglobin 13.6, hematocrit of 38.6, MCV of 85, platelets 300, pro time 14.5, INR of 1.2, PTT of 39.  Electrolytes within normal limits.  Glucose 111, BUN 11, creatinine 0.8, albumin 3.5.  Liver function studies normal.  Urinalysis negative.  Urine culture insignificant growth with 1000 colonies.  X-ray studies:  CT scan of the abdomen and pelvis on November 18, 2001 showed unremarkable abdomen.  There was a phlegmonous collection adjacent to and likely involving the distal sigmoid portion of the colon with marginal improvement since the prior scan of January 10.  There were small fluid attenuation components but no discrete drainable collection evident.  This phlegmonous region measured  6.5 cm axial dimension.  Fluid components felt to be less well-defined than initial study.  HOSPITAL COURSE:  The patient was admitted to the service of Dr. Victorino Greene and cared for by Dr. Arlyce Dice, who is covering the hospital.  She was initially placed on clear liquids, covered with IV Cipro and IV Flagyl, given Demerol and Phenergan as needed for control of pain and nausea.  The patient was seen in consultation by Shannon Greene for surgery, who felt that percutaneous drainage might be beneficial;  however, the radiologist did not feel that this collection was approachable, and therefore suggested continuing IV antibiotics with followup CT scan in 3-4 days.  In the interim, she did continue to have some intermittent low-grade fevers in the 100 range but clinically improved with significant decrease in her abdominal discomfort.  She underwent repeat CT scan on January 14.  At that time was feeling much better, had minimal abdominal discomfort, and no persistence of pressure on her bladder, which she had been having at the time of admission.  Repeat CT with findings as outlined above.  A decision was made, due to persistent phlegmon/abscess, that she should continue on IV antibiotics and that this would be best served with PICC line placement.  She had attempt at [facility] line placement on January 15 with complication of right brachial artery stick and hematoma.  She also had attempted left brachial PICC line placement, which was unsuccessful.  The patient did develop swelling and pain in the right upper extremity but no evidence of vascular or neurologic compromise.  Radiology followed her and recommended sling and compresses over the next few days.  Her arm circumference remained stable 36 hours post procedure and it was felt that she would be safe for discharge to home with instructions to call should she develop any increased diameter of her arm, numbness in he hand, or coolness in her distal extremity.  In the interim, we discussed antibiotic coverage with infectious disease consultant, who felt that high-dose oral antibiotics would be acceptable for 2-3 more weeks, given complication with IV access.  DISCHARGE MEDICATIONS:  She was then discharged to home on January 16 with: 1. Cipro 750 mg b.i.d. for three weeks. 2. Flagyl 500 mg t.i.d. x3 weeks. 3. Darvocet-N 100 one every six hours if needed for pain. 4. Zocor as previous. 5. Hydrochlorothiazide as previous. 6. Aspirin 81 mg as  previous. 7. Xalatan eyedrops as previous.   ACTIVITY:  She was to limit her activity.  DIET:  Follow a low-residue diet.  WOUND CARE:   She was to keep her sling on her right upper extremity for the next 3-4 days, until the swelling was decreased, and told to use cool and/or warm compresses in an alternating fashion over the next few days.  FOLLOWUP:  The patient was scheduled for followup CT scan of the abdomen and pelvis on Monday, January 27, at [facility], and followup office visit with Dr. Victorino Greene on January 29 at 8:45 a.m.  She was to call our office for problems in the interim. Dictated by:   Shannon Greene, P.A. Attending Physician:  Shannon Greene DD:  12/01/01 TD:  12/01/01 Job: [phone] [id number]

## 2011-08-30 ENCOUNTER — Other Ambulatory Visit: Payer: Self-pay | Admitting: Internal Medicine

## 2011-08-30 DIAGNOSIS — Z1231 Encounter for screening mammogram for malignant neoplasm of breast: Secondary | ICD-10-CM

## 2011-09-25 ENCOUNTER — Ambulatory Visit (HOSPITAL_COMMUNITY)
Admission: RE | Admit: 2011-09-25 | Discharge: 2011-09-25 | Disposition: A | Discharge status: Home / Self Care | Payer: Managed Care, Other (non HMO) | Source: Ambulatory Visit | Attending: Internal Medicine | Admitting: Internal Medicine

## 2011-09-25 DIAGNOSIS — Z1231 Encounter for screening mammogram for malignant neoplasm of breast: Secondary | ICD-10-CM

## 2011-11-13 ENCOUNTER — Encounter: Payer: Self-pay | Admitting: Internal Medicine

## 2011-11-13 ENCOUNTER — Ambulatory Visit (INDEPENDENT_AMBULATORY_CARE_PROVIDER_SITE_OTHER): Payer: Managed Care, Other (non HMO) | Admitting: Internal Medicine

## 2011-11-13 VITALS — BP 130/86 | HR 80 | Temp 98.1°F | Resp 12 | Ht 66.5 in | Wt 159.6 lb

## 2011-11-13 DIAGNOSIS — E049 Nontoxic goiter, unspecified: Secondary | ICD-10-CM

## 2011-11-13 DIAGNOSIS — K5790 Diverticulosis of intestine, part unspecified, without perforation or abscess without bleeding: Secondary | ICD-10-CM

## 2011-11-13 DIAGNOSIS — K5730 Diverticulosis of large intestine without perforation or abscess without bleeding: Secondary | ICD-10-CM

## 2011-11-13 DIAGNOSIS — E785 Hyperlipidemia, unspecified: Secondary | ICD-10-CM

## 2011-11-13 DIAGNOSIS — Z0000 Encounter for general adult medical examination without abnormal findings: Secondary | ICD-10-CM

## 2011-11-13 DIAGNOSIS — E119 Type 2 diabetes mellitus without complications: Secondary | ICD-10-CM

## 2011-11-13 DIAGNOSIS — I10 Essential (primary) hypertension: Secondary | ICD-10-CM

## 2011-11-13 DIAGNOSIS — E039 Hypothyroidism, unspecified: Secondary | ICD-10-CM

## 2011-11-13 LAB — LIPID PANEL
Cholesterol: 186 mg/dL (ref 0–200)
LDL Cholesterol: 103 mg/dL — ABNORMAL HIGH (ref 0–99)
VLDL: 16.2 mg/dL (ref 0.0–40.0)

## 2011-11-13 LAB — HEPATIC FUNCTION PANEL
Alkaline Phosphatase: 72 U/L (ref 39–117)
Bilirubin, Direct: 0.1 mg/dL (ref 0.0–0.3)
Total Bilirubin: 0.9 mg/dL (ref 0.3–1.2)
Total Protein: 7.1 g/dL (ref 6.0–8.3)

## 2011-11-13 LAB — BASIC METABOLIC PANEL
Calcium: 9.2 mg/dL (ref 8.4–10.5)
GFR: 85.18 mL/min (ref >60.00)
Glucose, Bld: 117 mg/dL — ABNORMAL HIGH (ref 70–99)
Sodium: 141 mEq/L (ref 135–145)

## 2011-11-13 LAB — MICROALBUMIN / CREATININE URINE RATIO
Creatinine,U: 191.1 mg/dL
Microalb Creat Ratio: 0.4 mg/g (ref 0.0–30.0)

## 2011-11-13 LAB — CBC WITH DIFFERENTIAL/PLATELET
Basophils Absolute: 0 10*3/uL (ref 0.0–0.1)
Eosinophils Relative: 1.1 % (ref 0.0–5.0)
Hemoglobin: 14.5 g/dL (ref 12.0–15.0)
Lymphocytes Relative: 33.9 % (ref 12.0–46.0)
Monocytes Relative: 8.7 % (ref 3.0–12.0)
Neutro Abs: 3 10*3/uL (ref 1.4–7.7)
RBC: 4.69 Mil/uL (ref 3.87–5.11)
RDW: 12.6 % (ref 11.5–14.6)
WBC: 5.3 10*3/uL (ref 4.5–10.5)

## 2011-11-13 LAB — HEMOGLOBIN A1C: Hgb A1c MFr Bld: 6.5 % (ref 4.6–6.5)

## 2011-11-13 NOTE — Assessment & Plan Note (Signed)
She is compliant with her statin; lipids and hepatic panel will be checked.

## 2011-11-13 NOTE — Patient Instructions (Signed)
Preventive Health Care: Exercise  30-45  minutes a day, 3-4 days a week. Walking is especially valuable in preventing Osteoporosis. Eat a low-fat diet with lots of fruits and vegetables, up to 7-9 servings per day.Consume less than 30 grams of sugar per day from foods & drinks with High Fructose Corn Syrup as # 1,2,3 or #4 on label.  You  will be called when the ultrasound of the thyroid as scheduled.

## 2011-11-13 NOTE — Assessment & Plan Note (Addendum)
Blood pressure well controlled; chemistries will be checked. There is RSR pattern in V1 without  duration criteria for right bundle branch block. No ischemic changes or hypertensive changes present. EKG is normal

## 2011-11-13 NOTE — Progress Notes (Signed)
Subjective:    Patient ID: Shannon Greene, female    DOB: 16-Sep-1934, 78 y.o.   MRN: [id number]  HPI Shannon Greene is here for a physical; she denies acute issues. She is on [employer] and does not use Medicare as her primary.      Review of Systems HYPERTENSION: Disease Monitoring: Blood pressure range-[id number]  Chest pain, palpitations-no        Dyspnea- no Medications: Compliance- yes Lightheadedness,Syncope-no   Edema-no  DIABETES: Disease Monitoring: Blood Sugar ranges-not monitored  Polyuria/phagia/dipsia- no       Visual problems- no Neuro symptoms: no Ophth exam: 12/20; no retinopathy Podiatry exam: no Medications: Compliance- no meds Diet: no plan  Hypoglycemic symptoms- no   HYPERLIPIDEMIA: Disease Monitoring: See symptoms for Hypertension Medications: Compliance- yes  Abd pain, bowel changes- no Muscle aches- no            Objective:   Physical Exam Gen.: Healthy and well-nourished in appearance. Alert, appropriate and cooperative throughout exam.Appears younger than stated age  Head: Normocephalic without obvious abnormalities Eyes: No corneal or conjunctival inflammation noted. Pupils equal round reactive to light and accommodation. Fundal exam is benign without hemorrhages, exudate, papilledema. Extraocular motion intact. Vision grossly normal with lenses. Ears: External  ear exam reveals no significant lesions or deformities. Canals clear .TMs normal. Hearing is grossly normal bilaterally. Nose: External nasal exam reveals no deformity or inflammation. Nasal mucosa are pink and moist. No lesions or exudates noted.   Mouth: Oral mucosa and oropharynx reveal no lesions or exudates. Teeth in good repair. Neck: No deformities, masses, or tenderness noted. Range of motion normal. Thyroid : Asymmetry, left lobe greater than right suggesting goiter. Lungs: Normal respiratory effort; chest expands symmetrically. Lungs are clear to auscultation without  rales, wheezes, or increased work of breathing. Heart: Normal rate and rhythm. Normal S1 and S2. No gallop, click, or rub. S 4 w/o  murmur. Abdomen: Bowel sounds normal; abdomen soft and nontender. No masses, organomegaly or hernias noted. Genitalia: S/P TAH   .                                                                                   Musculoskeletal/extremities: No deformity or scoliosis noted of  the thoracic or lumbar spine. No clubbing, cyanosis, edema noted. Range of motion  normal .Tone & strength  normal. Classic osteoarthritis and changes. Varus knee changes. Osteoma over dorsum of feet. Nail health  good. Vascular: Carotid, radial artery, dorsalis pedis and  posterior tibial pulses are full and equal. No bruits present. Neurologic: Alert and oriented x3. Deep tendon reflexes symmetrical and normal.          Skin: Intact without suspicious lesions or rashes. Lymph: No cervical, axillary lymphadenopathy present. Psych: Mood and affect are normal. Normally interactive  Assessment & Plan:  #1 comprehensive physical exam; no acute findings #2 see Problem List with Assessments & Recommendations

## 2011-11-13 NOTE — Assessment & Plan Note (Signed)
Thyroid function tests will be checked as well as an ultrasound. Goiter suggested on the left

## 2011-11-13 NOTE — Assessment & Plan Note (Signed)
She is on no medications for diabetes. A1c goal will be less than 8; ideal goal would be less than 7 as long as she is having no hypoglycemic episodes.

## 2011-11-16 ENCOUNTER — Other Ambulatory Visit: Payer: Managed Care, Other (non HMO)

## 2011-11-16 ENCOUNTER — Ambulatory Visit
Admission: RE | Admit: 2011-11-16 | Discharge: 2011-11-16 | Disposition: A | Discharge status: Home / Self Care | Payer: Managed Care, Other (non HMO) | Source: Ambulatory Visit | Attending: Internal Medicine | Admitting: Internal Medicine

## 2011-11-16 DIAGNOSIS — E049 Nontoxic goiter, unspecified: Secondary | ICD-10-CM

## 2011-11-22 ENCOUNTER — Telehealth: Payer: Self-pay

## 2011-11-22 MED ORDER — METHYCLOTHIAZIDE 5 MG PO TABS: 5.0000 mg | ORAL_TABLET | Freq: Every day | ORAL | Status: DC

## 2011-11-22 MED ORDER — RAMIPRIL 5 MG PO CAPS: 5.0000 mg | ORAL_CAPSULE | Freq: Every day | ORAL | Status: DC

## 2011-11-22 MED ORDER — ATORVASTATIN CALCIUM 10 MG PO TABS: 10.0000 mg | ORAL_TABLET | Freq: Every day | ORAL | Status: DC

## 2011-11-22 NOTE — Telephone Encounter (Signed)
Pt states she was in for a cpx on 11/13/2011 and pt did not get any refills on her medication.  Please send rx for Lipitor, Altace, and Enduron to [facility].  Thanks!

## 2012-01-06 ENCOUNTER — Other Ambulatory Visit: Payer: Self-pay | Admitting: Internal Medicine

## 2012-01-07 NOTE — Telephone Encounter (Signed)
Prescription sent to pharmacy.

## 2012-01-31 ENCOUNTER — Other Ambulatory Visit: Payer: Managed Care, Other (non HMO)

## 2012-01-31 ENCOUNTER — Other Ambulatory Visit (INDEPENDENT_AMBULATORY_CARE_PROVIDER_SITE_OTHER): Payer: Managed Care, Other (non HMO)

## 2012-01-31 DIAGNOSIS — E039 Hypothyroidism, unspecified: Secondary | ICD-10-CM

## 2012-01-31 LAB — TSH: TSH: 0.56 u[IU]/mL (ref 0.35–5.50)

## 2012-06-06 ENCOUNTER — Other Ambulatory Visit: Payer: Self-pay | Admitting: Internal Medicine

## 2012-06-06 MED ORDER — METHYCLOTHIAZIDE 5 MG PO TABS: 5.0000 mg | ORAL_TABLET | Freq: Every day | ORAL | Status: DC

## 2012-06-06 MED ORDER — RAMIPRIL 5 MG PO CAPS: 5.0000 mg | ORAL_CAPSULE | Freq: Every day | ORAL | Status: DC

## 2012-06-06 MED ORDER — ATORVASTATIN CALCIUM 10 MG PO TABS: 10.0000 mg | ORAL_TABLET | Freq: Every day | ORAL | Status: DC

## 2012-06-06 NOTE — Telephone Encounter (Signed)
Refill: Altace cap 5mg . [facility] fx # [phone]

## 2012-06-06 NOTE — Telephone Encounter (Signed)
Refill refill LIPITOR 10 MG Take 1 tablet (10 mg total) by mouth daily.  Wants 90-day supply - last wrt 1.17.13 90 wt/1-refill Last ov 1.8.13 CPE

## 2012-06-06 NOTE — Telephone Encounter (Signed)
Rx's sent to [facility] 

## 2012-06-06 NOTE — Telephone Encounter (Signed)
METHYCLOTHIAZIDE TABLET 5 MG -G-

## 2012-06-06 NOTE — Telephone Encounter (Signed)
DUPLICATE, RX SENT ALREADY

## 2012-06-06 NOTE — Telephone Encounter (Signed)
Refill ALTACE 5 MG Take 1 capsule (5 mg total) by mouth daily. Requesting 90-day supply last wrt 1.17.13 90 wt/1-refill  Last ov 1.8.13 CPE

## 2012-07-03 ENCOUNTER — Other Ambulatory Visit: Payer: Self-pay | Admitting: Internal Medicine

## 2012-09-09 ENCOUNTER — Other Ambulatory Visit: Payer: Self-pay | Admitting: Internal Medicine

## 2012-09-09 DIAGNOSIS — Z1231 Encounter for screening mammogram for malignant neoplasm of breast: Secondary | ICD-10-CM

## 2012-09-22 ENCOUNTER — Ambulatory Visit (INDEPENDENT_AMBULATORY_CARE_PROVIDER_SITE_OTHER): Payer: Managed Care, Other (non HMO) | Admitting: Internal Medicine

## 2012-09-22 ENCOUNTER — Encounter: Payer: Self-pay | Admitting: Internal Medicine

## 2012-09-22 VITALS — BP 124/80 | HR 94 | Temp 98.1°F | Wt 164.0 lb

## 2012-09-22 DIAGNOSIS — J209 Acute bronchitis, unspecified: Secondary | ICD-10-CM

## 2012-09-22 MED ORDER — AZITHROMYCIN 250 MG PO TABS: ORAL_TABLET | ORAL | Status: DC

## 2012-09-22 NOTE — Progress Notes (Signed)
  Subjective:    Patient ID: Shannon Greene, female    DOB: 07-Mar-1934, 78 y.o.   MRN: [id number]  HPI Her symptoms began 11/14 and sneezing and nonproductive cough. This progressed to a cough productive of yellow sputum. Robitussin CM and Tylenol been of some benefit. She denies associated shortness of breath or wheezing with the cough    Review of Systems She had no associated itchy, watery eyes. She also denies frontal headache, facial pain, nasal purulence, dental pain, sore throat, otic pain, or otic discharge. She has had some white discharge from     Objective:   Physical Exam General appearance:good health ;well nourished; no acute distress or increased work of breathing is present.  No  lymphadenopathy about the head, neck, or axilla noted.   Eyes: No conjunctival inflammation or lid edema is present.   Ears:  External ear exam shows no significant lesions or deformities.  Otoscopic examination reveals clear canals, tympanic membranes are intact bilaterally without bulging, retraction, inflammation or discharge.  Nose:  External nasal examination shows no deformity or inflammation. Nasal mucosa are pink and moist without lesions or exudates. No septal dislocation or deviation.No obstruction to airflow.   Oral exam: Dental hygiene is good; lips and gums are healthy appearing.There is no oropharyngeal erythema or exudate noted.   Neck:  No deformities,  masses, or tenderness noted.    Heart:  Normal rate and regular rhythm. S1 and S2 normal without gallop,  click, rub or other extra sounds. S4 with slurring at  LSB   Lungs:Chest clear to auscultation; no wheezes, rhonchi,rales ,or rubs present.No increased work of breathing.    Extremities:  No cyanosis, edema, or clubbing  noted    Skin: Warm & dry .          Assessment & Plan:  #1 acute bronchitis w/o bronchospasm Plan: See orders and recommendations

## 2012-09-22 NOTE — Patient Instructions (Addendum)
Plain Mucinex for thick secretions ;force NON dairy fluids . Use a Neti pot daily as needed for sinus congestion; going from open side to congested side . Nasal cleansing in the shower as discussed. Make sure that all residual soap is removed to prevent irritation.  Plain Allegra 160 daily as needed for itchy eyes & sneezing.    

## 2012-09-29 ENCOUNTER — Ambulatory Visit (HOSPITAL_COMMUNITY)
Admission: RE | Admit: 2012-09-29 | Discharge: 2012-09-29 | Disposition: A | Discharge status: Home / Self Care | Payer: Medicare Other | Source: Ambulatory Visit | Attending: Internal Medicine | Admitting: Internal Medicine

## 2012-09-29 DIAGNOSIS — Z1231 Encounter for screening mammogram for malignant neoplasm of breast: Secondary | ICD-10-CM | POA: Insufficient documentation

## 2012-12-04 ENCOUNTER — Ambulatory Visit (INDEPENDENT_AMBULATORY_CARE_PROVIDER_SITE_OTHER): Payer: Medicare Other | Admitting: Internal Medicine

## 2012-12-04 ENCOUNTER — Encounter: Payer: Self-pay | Admitting: Internal Medicine

## 2012-12-04 VITALS — BP 128/72 | HR 76 | Temp 98.1°F | Resp 12 | Ht 67.0 in | Wt 163.0 lb

## 2012-12-04 DIAGNOSIS — K5790 Diverticulosis of intestine, part unspecified, without perforation or abscess without bleeding: Secondary | ICD-10-CM

## 2012-12-04 DIAGNOSIS — E785 Hyperlipidemia, unspecified: Secondary | ICD-10-CM

## 2012-12-04 DIAGNOSIS — E119 Type 2 diabetes mellitus without complications: Secondary | ICD-10-CM

## 2012-12-04 DIAGNOSIS — N959 Unspecified menopausal and perimenopausal disorder: Secondary | ICD-10-CM

## 2012-12-04 DIAGNOSIS — I10 Essential (primary) hypertension: Secondary | ICD-10-CM

## 2012-12-04 DIAGNOSIS — K5730 Diverticulosis of large intestine without perforation or abscess without bleeding: Secondary | ICD-10-CM

## 2012-12-04 DIAGNOSIS — Z0000 Encounter for general adult medical examination without abnormal findings: Secondary | ICD-10-CM

## 2012-12-04 LAB — CBC WITH DIFFERENTIAL/PLATELET
Basophils Absolute: 0 10*3/uL (ref 0.0–0.1)
Basophils Relative: 0.4 % (ref 0.0–3.0)
Eosinophils Absolute: 0.1 10*3/uL (ref 0.0–0.7)
Hemoglobin: 15.1 g/dL — ABNORMAL HIGH (ref 12.0–15.0)
Lymphocytes Relative: 32 % (ref 12.0–46.0)
Lymphs Abs: 1.7 10*3/uL (ref 0.7–4.0)
MCHC: 34.2 g/dL (ref 30.0–36.0)
MCV: 89.6 fl (ref 78.0–100.0)
Monocytes Absolute: 0.4 10*3/uL (ref 0.1–1.0)
Neutro Abs: 3 10*3/uL (ref 1.4–7.7)
RDW: 12.1 % (ref 11.5–14.6)

## 2012-12-04 LAB — MICROALBUMIN / CREATININE URINE RATIO
Creatinine,U: 168.8 mg/dL
Microalb Creat Ratio: 0.2 mg/g (ref 0.0–30.0)

## 2012-12-04 LAB — HEPATIC FUNCTION PANEL
Albumin: 3.9 g/dL (ref 3.5–5.2)
Total Protein: 7.2 g/dL (ref 6.0–8.3)

## 2012-12-04 LAB — HEMOGLOBIN A1C: Hgb A1c MFr Bld: 6.8 % — ABNORMAL HIGH (ref 4.6–6.5)

## 2012-12-04 LAB — LIPID PANEL
Cholesterol: 191 mg/dL (ref 0–200)
HDL: 61.1 mg/dL (ref >39.00)
Triglycerides: 110 mg/dL (ref 0.0–149.0)

## 2012-12-04 LAB — BASIC METABOLIC PANEL
CO2: 34 mEq/L — ABNORMAL HIGH (ref 19–32)
Calcium: 9.2 mg/dL (ref 8.4–10.5)
Chloride: 98 mEq/L (ref 96–112)
Glucose, Bld: 131 mg/dL — ABNORMAL HIGH (ref 70–99)
Sodium: 140 mEq/L (ref 135–145)

## 2012-12-04 NOTE — Progress Notes (Signed)
Subjective:    Patient ID: Shannon Greene, female    DOB: February 06, 1934, 79 y.o.   MRN: [id number]  HPI Medicare Wellness Visit:  Psychosocial & medical history were reviewed as required by Medicare (abuse,antisocial behavioral risks,firearm risk).  Social history: caffeine: 1 cup tea daily , alcohol: no  ,  tobacco use:  never [place] : [place], 3 days / week planned  No home & personal  safety / fall risk Activities of daily living: no limitations  Seatbelt  and smoke alarm employed. Power of Attorney/Living Will status : in place Ophthalmology exam current(Dr Hazle Quant) Hearing evaluation not current Orientation :oriented X 3  Memory & recall :excellent Spelling  testing:excellent Mood & affect : normal . Depression / anxiety: denied Travel history : last [place] 35 years ago  Immunization status : Shingles  needed; PMH of tetanus reaction Transfusion history:  X 2 post C section , last 8  Preventive health surveillance ( colonoscopies, BMD , mammograms,PAP as per protocol/ SOC): BMD due Dental care:  Every 6 mos. Chart reviewed &  Updated. Active issues reviewed & addressed.       Review of Systems  The most recent A1c 11/2011  Was 6.5% , which correlates to an average sugar of 140 , and long-term risk of 30 %. Blood sugar not checked  .  No hypoglycemia Last ophthalmologic examination  2013  revealed no retinopathy. No active podiatry assessment on record. Diet is heart healthy .   The most recent lipids 1/13  reveal LDL 103 , HDL 67  , and triglycerides 81  . There is medical compliance with the statin.  Blood pressure  average is  120/70  . There is medical compliance with antihypertensive medications  No  medication adverse effects suggested  ROS: Constitutional: No  significant weight change,excess fatigue Eyes: No  blurred vision, double vision, or loss of vision Cardiovascular: no chest pain, palpitations, racing, irregular rhythm,syncope, claudication, or  edema  Respiratory: No exertional dyspnea, paroxysmal nocturnal dyspnea Genitourinary: No dysuria,hematuria, pyuria, frequency,  incontinence, nocturia Musculoskeletal: No myalgias or muscle cramping  Dermatologic: No non healing  lesions ,change in color or temperature of skin Neurologic: No  limb weakness,  numbness or tingling Endocrine: No change in hair/skin/ nails, excessive thirst, excessive hunger,or  excessive urination        Objective:   Physical Exam Gen.: Healthy and well-nourished in appearance. Alert, appropriate and cooperative throughout exam. Appears younger than stated age  Head: Normocephalic without obvious abnormalities  Eyes: No corneal or conjunctival inflammation noted. Pupils equal round reactive to light and accommodation. Fundal exam is benign without hemorrhages, exudate, papilledema. Extraocular motion intact. Vision grossly normal. Ears: External  ear exam reveals no significant lesions or deformities. Canals clear .TMs normal. Hearing is grossly normal bilaterally with lenses. Nose: External nasal exam reveals no deformity or inflammation. Nasal mucosa are pink and moist. No lesions or exudates noted.  Mouth: Oral mucosa and oropharynx reveal no lesions or exudates. Teeth in good repair. Neck: No deformities, masses, or tenderness noted. Range of motion normal.Thyroid ;L lobe > R w/o nodules. Lungs: Normal respiratory effort; chest expands symmetrically. Lungs are clear to auscultation without rales, wheezes, or increased work of breathing. Heart: Normal rate and rhythm. Normal S1 and S2. No gallop, click, or rub. S4 w/o murmur. Abdomen: Bowel sounds normal; abdomen soft and nontender. No masses, organomegaly or hernias noted. Genitalia:  S/P TAH  Musculoskeletal/extremities: Accentuated curvature of upper thoracic  spine.  No clubbing, cyanosis,or edema noted. Range of motion normal .Tone & strength  normal.Joints reveal  significant DJD DIP changes. Nail health good. Able to lie down & sit up w/o help. Negative SLR bilaterally. Ganglion dorsum L foot Vascular: Carotid, radial artery, dorsalis pedis and  posterior tibial pulses are full and equal. No bruits present. Neurologic: Alert and oriented x3. Deep tendon reflexes symmetrical and normal.  Light touch normal over feet.  Skin: Intact without suspicious lesions or rashes. Lymph: No cervical, axillary lymphadenopathy present. Psych: Mood and affect are normal. Normally interactive                                                                                         Assessment & Plan:  #1 Medicare Wellness Exam; criteria met ; data entered #2 Problem List reviewed ; Assessment/ Recommendations made Plan: see Orders

## 2012-12-04 NOTE — Patient Instructions (Addendum)
Minimal Blood Pressure Goal= AVERAGE < 140/90;  Ideal is an AVERAGE < 135/85. This AVERAGE should be calculated from @ least 5-7 BP readings taken @ different times of day on different days of week. You should not respond to isolated BP readings , but rather the AVERAGE for that week .Please bring your  blood pressure cuff to office visits to verify that it is reliable.It  can also be checked against the blood pressure device at the pharmacy. Finger or wrist cuffs are not dependable; an arm cuff is.  To prevent palpitations or premature beats, avoid stimulants such as decongestants, diet pills, nicotine, or caffeine (coffee, tea, cola, or chocolate) to excess.   If you activate My Chart; the results can be released to you as soon as they populate from the lab. If you choose not to use this program; the labs have to be reviewed, copied & mailed   causing a delay in getting the results to you.  Review and correct the record as indicated. Please share record with all medical staff seen.

## 2012-12-05 ENCOUNTER — Encounter: Payer: Self-pay | Admitting: Internal Medicine

## 2012-12-11 ENCOUNTER — Ambulatory Visit (INDEPENDENT_AMBULATORY_CARE_PROVIDER_SITE_OTHER)
Admission: RE | Admit: 2012-12-11 | Discharge: 2012-12-11 | Disposition: A | Discharge status: Home / Self Care | Payer: Medicare Other | Source: Ambulatory Visit

## 2012-12-11 DIAGNOSIS — N959 Unspecified menopausal and perimenopausal disorder: Secondary | ICD-10-CM

## 2012-12-22 ENCOUNTER — Other Ambulatory Visit: Payer: Self-pay | Admitting: Internal Medicine

## 2013-01-23 ENCOUNTER — Other Ambulatory Visit: Payer: Self-pay | Admitting: Internal Medicine

## 2013-02-02 ENCOUNTER — Telehealth: Payer: Self-pay

## 2013-02-02 MED ORDER — HYDROCHLOROTHIAZIDE 12.5 MG PO TABS: 12.5000 mg | ORAL_TABLET | Freq: Every day | ORAL | Status: DC

## 2013-02-02 NOTE — Telephone Encounter (Signed)
Change to HCTZ 12.5 mg #90

## 2013-02-02 NOTE — Telephone Encounter (Signed)
Patient called c/o cost increase of enduron 5 mg, 90 day supply is 90 dollars when it use to cost 10 dollars. Patient would like this medication changed for her next refill ( 3 months from now))  Hopp please advise on medication change request

## 2013-02-02 NOTE — Telephone Encounter (Signed)
Patient aware of rx change and rx sent to pharmacy

## 2013-03-06 ENCOUNTER — Other Ambulatory Visit: Payer: Self-pay | Admitting: Internal Medicine

## 2013-04-21 ENCOUNTER — Telehealth: Payer: Self-pay | Admitting: Internal Medicine

## 2013-04-21 NOTE — Telephone Encounter (Signed)
Just FYI: Patient called and said that she needed to come in to see Dr. Alwyn Ren for her [employer]. I advised patient that Dr. Alwyn Ren was fully booked for today and out for the remainder of the week but I would be happy to schedule her to see another provider in our office today. Patient then said "No, I will not do that, I need to see Dr. Alwyn Ren."  Again, I told the patient that Dr. Alwyn Ren was not available until next week but re-assured her that there were other providers in the office who were capable of seeing her for the poison ivy as soon as today. Patient again declined and stated, "I guess your time is more valuable than me," and then hung up.

## 2013-04-29 ENCOUNTER — Telehealth: Payer: Self-pay | Admitting: Internal Medicine

## 2013-04-29 MED ORDER — HYDROCHLOROTHIAZIDE 12.5 MG PO TABS: 12.5000 mg | ORAL_TABLET | Freq: Every day | ORAL | Status: DC

## 2013-04-29 NOTE — Telephone Encounter (Signed)
rx sent to pharmacy

## 2013-04-29 NOTE — Telephone Encounter (Signed)
Patient states we were supposed to change her fluid pill to hctz but [facility] on [address] has no record of it. Patient is needing a refill.

## 2013-07-07 ENCOUNTER — Other Ambulatory Visit: Payer: Self-pay | Admitting: Internal Medicine

## 2013-07-08 NOTE — Telephone Encounter (Signed)
Pt needs an OV for additional refills.  

## 2013-07-14 ENCOUNTER — Other Ambulatory Visit: Payer: Self-pay | Admitting: Internal Medicine

## 2013-08-12 ENCOUNTER — Other Ambulatory Visit: Payer: Self-pay | Admitting: Internal Medicine

## 2013-08-12 DIAGNOSIS — Z1231 Encounter for screening mammogram for malignant neoplasm of breast: Secondary | ICD-10-CM

## 2013-08-14 ENCOUNTER — Telehealth: Payer: Self-pay

## 2013-08-14 NOTE — Telephone Encounter (Signed)
Updated flu vaccine.

## 2013-08-17 ENCOUNTER — Ambulatory Visit (INDEPENDENT_AMBULATORY_CARE_PROVIDER_SITE_OTHER): Payer: Medicare Other | Admitting: Family Medicine

## 2013-08-17 ENCOUNTER — Encounter: Payer: Self-pay | Admitting: Family Medicine

## 2013-08-17 VITALS — BP 120/62 | HR 87 | Temp 98.7°F | Wt 165.4 lb

## 2013-08-17 DIAGNOSIS — J209 Acute bronchitis, unspecified: Secondary | ICD-10-CM

## 2013-08-17 MED ORDER — LEVOFLOXACIN 500 MG PO TABS: 500.0000 mg | ORAL_TABLET | Freq: Every day | ORAL | Status: DC

## 2013-08-17 NOTE — Patient Instructions (Signed)

## 2013-08-17 NOTE — Progress Notes (Signed)
  Subjective:     Shannon Greene is a 79 y.o. female who presents for evaluation of symptoms of a URI. Symptoms include congestion, low grade fever, post nasal drip, productive cough with  yellow colored sputum and wheezing. Onset of symptoms was 6 days ago, and has been gradually worsening since that time. Treatment to date: none.  The following portions of the patient's history were reviewed and updated as appropriate: allergies, current medications, past family history, past medical history, past social history, past surgical history and problem list.  Review of Systems Pertinent items are noted in HPI.   Objective:    BP 120/62  Pulse 87  Temp(Src) 98.7 F (37.1 C) (Oral)  Wt 165 lb 6.4 oz (75.025 kg)  BMI 25.9 kg/m2  SpO2 95% General appearance: alert, cooperative, appears stated age and no distress Ears: normal TM's and external ear canals both ears Nose: Nares normal. Septum midline. Mucosa normal. No drainage or sinus tenderness. Throat: lips, mucosa, and tongue normal; teeth and gums normal Neck: no adenopathy, supple, symmetrical, trachea midline and thyroid not enlarged, symmetric, no tenderness/mass/nodules Lungs: rhonchi bilaterally and wheezes bilaterally Heart: S1, S2 normal   Assessment:    bronchitis   Plan:    Suggested symptomatic OTC remedies. Nasal saline spray for congestion. levaquin---pt states cipro is the only thing that works--z pak never works or pcn per orders. Follow up as needed.

## 2013-08-24 ENCOUNTER — Telehealth: Payer: Self-pay | Admitting: *Deleted

## 2013-08-24 NOTE — Telephone Encounter (Signed)
What symptoms ?  Take otc claritin , allegra or zyrtec with it.   We can also call in flonase----  Finish levaquin.  F/u hopp if no better

## 2013-08-24 NOTE — Telephone Encounter (Signed)
Patient wanted to come in and see Dr.Hopper. Apt scheduled for tomorrow.     KP

## 2013-08-24 NOTE — Telephone Encounter (Signed)
Please advise      KP 

## 2013-08-24 NOTE — Telephone Encounter (Signed)
Spoke with patient and she is still having cough, congestion, yellow phlegm and she said he husband was int he same day and he is wheezing

## 2013-08-24 NOTE — Telephone Encounter (Signed)
Patient called and stated that she is not feeling better since starting Levaquin last week. Please advise.

## 2013-08-24 NOTE — Telephone Encounter (Signed)
Message copied by Arnette Norris on Mon Aug 24, 2013  1:18 PM ------      Message from: Pecola Lawless      Created: Mon Aug 24, 2013 12:37 PM       Myrene Buddy, apparently you saw her last week and put her on Levaquin. Could you please followup. Thank you ------

## 2013-08-25 ENCOUNTER — Encounter: Payer: Self-pay | Admitting: Internal Medicine

## 2013-08-25 ENCOUNTER — Ambulatory Visit (INDEPENDENT_AMBULATORY_CARE_PROVIDER_SITE_OTHER): Payer: Medicare Other | Admitting: Internal Medicine

## 2013-08-25 VITALS — BP 131/80 | HR 85 | Temp 97.8°F | Resp 18 | Wt 165.0 lb

## 2013-08-25 DIAGNOSIS — J209 Acute bronchitis, unspecified: Secondary | ICD-10-CM

## 2013-08-25 DIAGNOSIS — J310 Chronic rhinitis: Secondary | ICD-10-CM

## 2013-08-25 NOTE — Patient Instructions (Signed)
Plain Mucinex (NOT D) for thick secretions ;force NON dairy fluids .   Nasal cleansing in the shower as discussed with lather of mild shampoo.After 10 seconds wash off lather while  exhaling through nostrils. Make sure that all residual soap is removed to prevent irritation.  Nasacort AQ 1 spray in each nostril twice a day as needed. Use the "crossover" technique into opposite nostril spraying toward opposite ear @ 45 degree angle, not straight up into nostril.  Use a Neti pot daily only  as needed for significant sinus congestion; going from open side to congested side . Plain Allegra (NOT D )  160 daily , Loratidine 10 mg , OR Zyrtec 10 mg @ bedtime  as needed for itchy eyes & sneezing.    

## 2013-08-25 NOTE — Progress Notes (Signed)
  Subjective:    Patient ID: Shannon Greene, female    DOB: [birth year], 79 y.o.   MRN: [id number]  HPI    She has completed 7 days of Levaquin; she continues to have scant yellow sputum. Robitussin-DM was of benefit. She does have a limbus nasal secretions but they're clear.  She previously had facial sinus area pain but this has resolved.   Review of Systems  At this time she specifically denies any significant extrinsic symptoms of itchy, watery eyes, or sneezing. She also has no fever, chills, or sweats. There is no shortness of breath or wheezing with the cough.     Objective:   Physical Exam General appearance:good health ;well nourished; no acute distress or increased work of breathing is present.  No  lymphadenopathy about the head, neck, or axilla noted.   Eyes: No conjunctival inflammation or lid edema is present.   Ears:  External ear exam shows no significant lesions or deformities.  Otoscopic examination reveals clear canals, tympanic membranes are intact bilaterally without bulging, retraction, inflammation or discharge.  Nose:  External nasal examination shows no deformity or inflammation. Nasal mucosa are pink and moist without lesions or exudates. No septal dislocation or deviation.No obstruction to airflow.   Oral exam: Dental hygiene is good; lips and gums are healthy appearing.There is no oropharyngeal erythema or exudate noted.   Neck:  No deformities,  masses, or tenderness noted.    Heart:  Normal rate and regular rhythm. S1 and S2 normal without gallop, murmur, click, rub or other extra sounds.  S4  Lungs:Chest clear to auscultation; no wheezes, rhonchi,rales ,or rubs present.No increased work of breathing.    Extremities:  No cyanosis, edema, or clubbing  noted    Skin: Warm & dry          Assessment & Plan:  #1 acute bronchitis w/o bronchospasm, S/P 7 days Levaquin #2 rhinitis Plan: See orders and recommendations

## 2013-09-30 ENCOUNTER — Ambulatory Visit (HOSPITAL_COMMUNITY)
Admission: RE | Admit: 2013-09-30 | Discharge: 2013-09-30 | Disposition: A | Discharge status: Home / Self Care | Payer: Medicare Other | Source: Ambulatory Visit | Attending: Internal Medicine | Admitting: Internal Medicine

## 2013-09-30 DIAGNOSIS — Z1231 Encounter for screening mammogram for malignant neoplasm of breast: Secondary | ICD-10-CM | POA: Insufficient documentation

## 2013-10-26 ENCOUNTER — Other Ambulatory Visit: Payer: Self-pay | Admitting: Internal Medicine

## 2013-10-27 NOTE — Telephone Encounter (Signed)
Atorvastatin and HCTZ refilled per protocol. JG//CMA

## 2013-12-07 ENCOUNTER — Ambulatory Visit (INDEPENDENT_AMBULATORY_CARE_PROVIDER_SITE_OTHER): Payer: Medicare HMO | Admitting: Internal Medicine

## 2013-12-07 ENCOUNTER — Other Ambulatory Visit (INDEPENDENT_AMBULATORY_CARE_PROVIDER_SITE_OTHER): Payer: Medicare HMO

## 2013-12-07 ENCOUNTER — Encounter: Payer: Self-pay | Admitting: Internal Medicine

## 2013-12-07 ENCOUNTER — Other Ambulatory Visit: Payer: Self-pay | Admitting: Internal Medicine

## 2013-12-07 VITALS — BP 102/70 | HR 77 | Temp 97.8°F | Wt 165.1 lb

## 2013-12-07 DIAGNOSIS — K5730 Diverticulosis of large intestine without perforation or abscess without bleeding: Secondary | ICD-10-CM

## 2013-12-07 DIAGNOSIS — Z0000 Encounter for general adult medical examination without abnormal findings: Secondary | ICD-10-CM

## 2013-12-07 DIAGNOSIS — E119 Type 2 diabetes mellitus without complications: Secondary | ICD-10-CM

## 2013-12-07 DIAGNOSIS — E785 Hyperlipidemia, unspecified: Secondary | ICD-10-CM

## 2013-12-07 DIAGNOSIS — K5790 Diverticulosis of intestine, part unspecified, without perforation or abscess without bleeding: Secondary | ICD-10-CM

## 2013-12-07 DIAGNOSIS — E039 Hypothyroidism, unspecified: Secondary | ICD-10-CM

## 2013-12-07 DIAGNOSIS — I10 Essential (primary) hypertension: Secondary | ICD-10-CM

## 2013-12-07 LAB — LIPID PANEL
CHOLESTEROL: 180 mg/dL (ref 0–200)
HDL: 62.7 mg/dL (ref >39.00)
LDL Cholesterol: 101 mg/dL — ABNORMAL HIGH (ref 0–99)
Total CHOL/HDL Ratio: 3
Triglycerides: 83 mg/dL (ref 0.0–149.0)
VLDL: 16.6 mg/dL (ref 0.0–40.0)

## 2013-12-07 LAB — CBC WITH DIFFERENTIAL/PLATELET
BASOS ABS: 0 10*3/uL (ref 0.0–0.1)
Basophils Relative: 0.7 % (ref 0.0–3.0)
Eosinophils Absolute: 0.1 10*3/uL (ref 0.0–0.7)
Eosinophils Relative: 1.9 % (ref 0.0–5.0)
HEMATOCRIT: 44.8 % (ref 36.0–46.0)
Hemoglobin: 14.7 g/dL (ref 12.0–15.0)
LYMPHS ABS: 1.9 10*3/uL (ref 0.7–4.0)
Lymphocytes Relative: 37 % (ref 12.0–46.0)
MCHC: 32.8 g/dL (ref 30.0–36.0)
MCV: 92.7 fl (ref 78.0–100.0)
MONOS PCT: 10.4 % (ref 3.0–12.0)
Monocytes Absolute: 0.5 10*3/uL (ref 0.1–1.0)
Neutro Abs: 2.6 10*3/uL (ref 1.4–7.7)
Neutrophils Relative %: 50 % (ref 43.0–77.0)
PLATELETS: 209 10*3/uL (ref 150.0–400.0)
RBC: 4.83 Mil/uL (ref 3.87–5.11)
RDW: 12.3 % (ref 11.5–14.6)
WBC: 5.1 10*3/uL (ref 4.5–10.5)

## 2013-12-07 LAB — BASIC METABOLIC PANEL
BUN: 22 mg/dL (ref 6–23)
CALCIUM: 9.3 mg/dL (ref 8.4–10.5)
CO2: 33 mEq/L — ABNORMAL HIGH (ref 19–32)
Chloride: 101 mEq/L (ref 96–112)
Creatinine, Ser: 0.7 mg/dL (ref 0.4–1.2)
GFR: 84.71 mL/min (ref >60.00)
GLUCOSE: 107 mg/dL — AB (ref 70–99)
POTASSIUM: 4 meq/L (ref 3.5–5.1)
Sodium: 141 mEq/L (ref 135–145)

## 2013-12-07 LAB — HEMOGLOBIN A1C: HEMOGLOBIN A1C: 6.7 % — AB (ref 4.6–6.5)

## 2013-12-07 LAB — TSH: TSH: 0.31 u[IU]/mL — ABNORMAL LOW (ref 0.35–5.50)

## 2013-12-07 LAB — MICROALBUMIN / CREATININE URINE RATIO
Creatinine,U: 169.1 mg/dL
MICROALB UR: 0.5 mg/dL (ref 0.0–1.9)
Microalb Creat Ratio: 0.3 mg/g (ref 0.0–30.0)

## 2013-12-07 LAB — HEPATIC FUNCTION PANEL
ALBUMIN: 3.8 g/dL (ref 3.5–5.2)
ALK PHOS: 62 U/L (ref 39–117)
ALT: 21 U/L (ref 0–35)
AST: 20 U/L (ref 0–37)
Bilirubin, Direct: 0.1 mg/dL (ref 0.0–0.3)
Total Bilirubin: 0.9 mg/dL (ref 0.3–1.2)
Total Protein: 6.9 g/dL (ref 6.0–8.3)

## 2013-12-07 MED ORDER — LEVOTHYROXINE SODIUM 50 MCG PO TABS: ORAL_TABLET | ORAL | Status: DC

## 2013-12-07 NOTE — Patient Instructions (Signed)
Your next office appointment will be determined based upon review of your pending labs. Those instructions will be transmitted to you through My Chart . 

## 2013-12-07 NOTE — Progress Notes (Signed)
Pre visit review using our clinic review tool, if applicable. No additional management support is needed unless otherwise documented below in the visit note. 

## 2013-12-07 NOTE — Addendum Note (Signed)
Addended by: Harl Bowie on: 12/07/2013 09:15 AM   Modules accepted: Orders

## 2013-12-07 NOTE — Progress Notes (Signed)
Subjective:    Patient ID: Shannon Greene, female    DOB: 1934-10-08, 80 y.o.   MRN: [id number]  HPI Medicare Wellness Visit: Psychosocial and medical history were reviewed as required by Medicare (history related to abuse, antisocial behavior , firearm risk). Social history: Caffeine:1 tea & 2 diet colas/day  , Alcohol: no , Tobacco GGE:ZMOQH Exercise:see below Personal safety/fall risk:no Limitations of activities of daily living:no Seatbelt/ smoke alarm use:yes Healthcare Power of Attorney/Living Will status: in place Ophthalmologic exam status:UTD Hearing evaluation status:not UTD Orientation: Oriented X Memory and recall: good Math testing: good Depression/anxiety assessment: no Foreign travel history:never Immunization status for influenza/pneumonia/ shingles /tetanus:no Shingles  Transfusion history:1959 post partum Preventive maintenance: Colonoscopy/BMD/mammogram/Pap as per protocol/standard care:aged out of colonoscopy;BMD current Dental care:every 6 mos Chart reviewed and updated. Active issues reviewed and addressed as documented below.    Review of Systems A heart healthy diet is followed; exercise encompasses 30-60 minutes 2 times per week as Silver Sneakers without symptoms.  Family history is negative for premature coronary disease.Brother CVA @ 37. There is medication compliance with the statin.  Low dose ASA taken Specifically denied are  chest pain, palpitations, dyspnea, or claudication.  Significant abdominal symptoms, memory deficit, or myalgias not present.     Objective:   Physical Exam Gen.: Healthy and well-nourished in appearance. Alert, appropriate and cooperative throughout exam. Appears younger than stated age  Head: Normocephalic without obvious abnormalities  Eyes: No corneal or conjunctival inflammation noted. Pupils equal round reactive to light and accommodation. Extraocular motion intact.  Ears: External  ear exam reveals no  significant lesions or deformities. Canals clear .TMs normal. Hearing is grossly normal bilaterally. Nose: External nasal exam reveals no deformity or inflammation. Nasal mucosa are pink and moist. No lesions or exudates noted.   Mouth: Oral mucosa and oropharynx reveal no lesions or exudates. Teeth in good repair. Neck: No deformities, masses, or tenderness noted. Range of motion slightly decreased. 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 Lungs: Normal respiratory effort; chest expands symmetrically. Lungs are clear to auscultation without rales, wheezes, or increased work of breathing. Heart: Normal rate and rhythm. Normal S1 and S2. No gallop, click, or rub. S4 w/o murmur. Abdomen: Bowel sounds normal; abdomen soft and nontender. No masses, organomegaly or hernias noted. Genitalia:  as per Gyn                                  Musculoskeletal/extremities:   Accentuated curvature of upper thoracic spine. No clubbing, cyanosis, edema, or significant extremity  deformity noted. Range of motion normal .Tone & strength normal. Hand joints  reveal mild  DJD DIP changes. Fingernail  health good. Able to lie down & sit up w/o help. Negative SLR bilaterally Vascular: Carotid, radial artery, dorsalis pedis and  posterior tibial pulses are full and equal. No bruits present. Neurologic: Alert and oriented x3. Deep tendon reflexes symmetrical and normal.   Light touch normal over feet.     Skin: Intact without suspicious lesions or rashes.Keratoses of back.Cyst over osteoma L foot Lymph: No cervical, axillary lymphadenopathy present. Psych: Mood and affect are normal. Normally interactive  Assessment & Plan:  #1 Medicare Wellness Exam; criteria met ; data entered #2 Problem List/Diagnoses reviewed Plan:  Assessments made/ Orders entered  

## 2014-01-01 ENCOUNTER — Telehealth: Payer: Self-pay | Admitting: Internal Medicine

## 2014-01-01 MED ORDER — RAMIPRIL 5 MG PO CAPS: ORAL_CAPSULE | ORAL | Status: DC

## 2014-01-01 NOTE — Telephone Encounter (Signed)
Pt called request refill for ramilpril. Please advise.

## 2014-01-01 NOTE — Telephone Encounter (Signed)
Rx sent to the pharmacy by e-script.  Pt aware.//AB/CMA 

## 2014-01-05 ENCOUNTER — Telehealth: Payer: Self-pay | Admitting: *Deleted

## 2014-01-05 DIAGNOSIS — E039 Hypothyroidism, unspecified: Secondary | ICD-10-CM

## 2014-01-05 MED ORDER — ATORVASTATIN CALCIUM 10 MG PO TABS: ORAL_TABLET | ORAL | Status: DC

## 2014-01-05 MED ORDER — HYDROCHLOROTHIAZIDE 12.5 MG PO TABS: ORAL_TABLET | ORAL | Status: DC

## 2014-01-05 MED ORDER — LEVOTHYROXINE SODIUM 50 MCG PO TABS: ORAL_TABLET | ORAL | Status: DC

## 2014-01-05 NOTE — Telephone Encounter (Signed)
Patient phoned needing [facility]s [facility].  Refilled per protocol.

## 2014-01-22 ENCOUNTER — Telehealth: Payer: Self-pay | Admitting: Internal Medicine

## 2014-01-22 NOTE — Telephone Encounter (Signed)
Pt called to say that Dr. Bing Plume needs a [facility] referral for a follow up on eye pressure.

## 2014-01-23 ENCOUNTER — Encounter: Payer: Self-pay | Admitting: Internal Medicine

## 2014-01-23 ENCOUNTER — Other Ambulatory Visit: Payer: Self-pay | Admitting: Internal Medicine

## 2014-01-23 DIAGNOSIS — H40059 Ocular hypertension, unspecified eye: Secondary | ICD-10-CM

## 2014-08-05 ENCOUNTER — Other Ambulatory Visit: Payer: Self-pay | Admitting: Internal Medicine

## 2014-08-05 DIAGNOSIS — Z1231 Encounter for screening mammogram for malignant neoplasm of breast: Secondary | ICD-10-CM

## 2014-08-20 ENCOUNTER — Other Ambulatory Visit: Payer: Self-pay

## 2014-10-04 ENCOUNTER — Ambulatory Visit (HOSPITAL_COMMUNITY)
Admission: RE | Admit: 2014-10-04 | Discharge: 2014-10-04 | Disposition: A | Discharge status: Home / Self Care | Payer: Commercial Managed Care - HMO | Source: Ambulatory Visit | Attending: Internal Medicine | Admitting: Internal Medicine

## 2014-10-04 DIAGNOSIS — Z1231 Encounter for screening mammogram for malignant neoplasm of breast: Secondary | ICD-10-CM | POA: Diagnosis not present

## 2014-10-05 ENCOUNTER — Other Ambulatory Visit: Payer: Self-pay | Admitting: Internal Medicine

## 2014-10-05 DIAGNOSIS — R928 Other abnormal and inconclusive findings on diagnostic imaging of breast: Secondary | ICD-10-CM

## 2014-10-20 ENCOUNTER — Ambulatory Visit
Admission: RE | Admit: 2014-10-20 | Discharge: 2014-10-20 | Disposition: A | Discharge status: Home / Self Care | Payer: Commercial Managed Care - HMO | Source: Ambulatory Visit | Attending: Internal Medicine | Admitting: Internal Medicine

## 2014-10-20 DIAGNOSIS — R928 Other abnormal and inconclusive findings on diagnostic imaging of breast: Secondary | ICD-10-CM

## 2014-11-30 ENCOUNTER — Other Ambulatory Visit: Payer: Self-pay | Admitting: Internal Medicine

## 2014-12-09 ENCOUNTER — Encounter: Payer: Self-pay | Admitting: Internal Medicine

## 2014-12-09 ENCOUNTER — Ambulatory Visit (INDEPENDENT_AMBULATORY_CARE_PROVIDER_SITE_OTHER): Payer: Commercial Managed Care - HMO | Admitting: Internal Medicine

## 2014-12-09 ENCOUNTER — Other Ambulatory Visit (INDEPENDENT_AMBULATORY_CARE_PROVIDER_SITE_OTHER): Payer: Commercial Managed Care - HMO

## 2014-12-09 VITALS — BP 114/70 | HR 79 | Temp 98.1°F | Ht 67.0 in | Wt 172.0 lb

## 2014-12-09 DIAGNOSIS — I10 Essential (primary) hypertension: Secondary | ICD-10-CM

## 2014-12-09 DIAGNOSIS — E785 Hyperlipidemia, unspecified: Secondary | ICD-10-CM | POA: Diagnosis not present

## 2014-12-09 DIAGNOSIS — E119 Type 2 diabetes mellitus without complications: Secondary | ICD-10-CM | POA: Diagnosis not present

## 2014-12-09 DIAGNOSIS — E038 Other specified hypothyroidism: Secondary | ICD-10-CM

## 2014-12-09 DIAGNOSIS — K5730 Diverticulosis of large intestine without perforation or abscess without bleeding: Secondary | ICD-10-CM

## 2014-12-09 LAB — TSH: TSH: 0.49 u[IU]/mL (ref 0.35–4.50)

## 2014-12-09 LAB — LIPID PANEL
CHOL/HDL RATIO: 3
CHOLESTEROL: 178 mg/dL (ref 0–200)
HDL: 62.4 mg/dL (ref >39.00)
LDL Cholesterol: 94 mg/dL (ref 0–99)
NonHDL: 115.6
TRIGLYCERIDES: 109 mg/dL (ref 0.0–149.0)
VLDL: 21.8 mg/dL (ref 0.0–40.0)

## 2014-12-09 LAB — CBC WITH DIFFERENTIAL/PLATELET
BASOS ABS: 0 10*3/uL (ref 0.0–0.1)
BASOS PCT: 0.5 % (ref 0.0–3.0)
Eosinophils Absolute: 0.1 10*3/uL (ref 0.0–0.7)
Eosinophils Relative: 1.4 % (ref 0.0–5.0)
HCT: 43.5 % (ref 36.0–46.0)
HEMOGLOBIN: 15 g/dL (ref 12.0–15.0)
Lymphocytes Relative: 34.8 % (ref 12.0–46.0)
Lymphs Abs: 2.1 10*3/uL (ref 0.7–4.0)
MCHC: 34.5 g/dL (ref 30.0–36.0)
MCV: 88.1 fl (ref 78.0–100.0)
MONO ABS: 0.5 10*3/uL (ref 0.1–1.0)
MONOS PCT: 8.4 % (ref 3.0–12.0)
NEUTROS ABS: 3.3 10*3/uL (ref 1.4–7.7)
NEUTROS PCT: 54.9 % (ref 43.0–77.0)
PLATELETS: 215 10*3/uL (ref 150.0–400.0)
RBC: 4.94 Mil/uL (ref 3.87–5.11)
RDW: 12.4 % (ref 11.5–15.5)
WBC: 6.1 10*3/uL (ref 4.0–10.5)

## 2014-12-09 LAB — HEPATIC FUNCTION PANEL
ALT: 19 U/L (ref 0–35)
AST: 18 U/L (ref 0–37)
Albumin: 4 g/dL (ref 3.5–5.2)
Alkaline Phosphatase: 77 U/L (ref 39–117)
Bilirubin, Direct: 0.2 mg/dL (ref 0.0–0.3)
Total Bilirubin: 0.8 mg/dL (ref 0.2–1.2)
Total Protein: 6.8 g/dL (ref 6.0–8.3)

## 2014-12-09 LAB — HEMOGLOBIN A1C: Hgb A1c MFr Bld: 7.2 % — ABNORMAL HIGH (ref 4.6–6.5)

## 2014-12-09 LAB — MICROALBUMIN / CREATININE URINE RATIO
CREATININE, U: 220.4 mg/dL
MICROALB UR: 0.9 mg/dL (ref 0.0–1.9)
MICROALB/CREAT RATIO: 0.4 mg/g (ref 0.0–30.0)

## 2014-12-09 LAB — BASIC METABOLIC PANEL
BUN: 21 mg/dL (ref 6–23)
CALCIUM: 9.5 mg/dL (ref 8.4–10.5)
CO2: 33 meq/L — AB (ref 19–32)
CREATININE: 0.84 mg/dL (ref 0.40–1.20)
Chloride: 102 mEq/L (ref 96–112)
GFR: 69.59 mL/min (ref >60.00)
GLUCOSE: 134 mg/dL — AB (ref 70–99)
Potassium: 4.8 mEq/L (ref 3.5–5.1)
SODIUM: 141 meq/L (ref 135–145)

## 2014-12-09 NOTE — Progress Notes (Signed)
   Subjective:    Patient ID: Shannon Greene, female    DOB: 1933-12-12, 81 y.o.   MRN: [id number]  HPI The patient is here to assess status of active health conditions.  PMH, FH, & Social History reviewed & updated.  She is on a modified heart healthy diet. She's been compliant with medications without adverse effects  Blood pressure range 110-120/60-70.  She's not monitoring glucoses.    Review of Systems  Significant headaches, epistaxis, chest pain, palpitations, exertional dyspnea, claudication, paroxysmal nocturnal dyspnea, or edema absent. No GI symptoms of melena or rectal bleeding  No memory loss . No myalgias No significant change in weight; significant fatigue; sleep disorder; change in appetite. No blurred, double ,loss of vision No constipation; diarrhea;hoarseness;dysphagia No change in nails,hair,skin No polyuria, polydipsia, or polyphagia No numbness or tingling; tremor No anxiety; depression; panic attacks No temperature intolerance to heat ,cold       Objective:   Physical Exam Gen.: Adequately nourished in appearance. Alert, appropriate and cooperative throughout exam. As per CDC Guidelines ,Epic documents her as "overweight". Appears younger than stated age  Head: Normocephalic without obvious abnormalitie  Eyes: No corneal or conjunctival inflammation noted. Pupils equal round reactive to light and accommodation. Extraocular motion intact.  Ears: External  ear exam reveals no significant lesions or deformities. Canals clear .TMs normal. Hearing is grossly normal bilaterally. Nose: External nasal exam reveals no deformity or inflammation. Nasal mucosa are pink and moist. No lesions or exudates noted.   Mouth: Oral mucosa and oropharynx reveal no lesions or exudates. Teeth in good repair. Neck: No deformities, masses, or tenderness noted. Range of motion & Thyroid normal. Lungs: Normal respiratory effort; chest expands symmetrically. Lungs are clear to  auscultation without rales, wheezes, or increased work of breathing. Heart: Normal rate and rhythm. Normal S1 and S2. No gallop, click, or rub. No murmur. Abdomen: Bowel sounds normal; abdomen soft and nontender. No masses, organomegaly or hernias noted. Genitalia: as per Gyn                                  Musculoskeletal/extremities: Accentuated curvature of upper thoracic spine. No clubbing, cyanosis, edema, or significant extremity  deformity noted.  Range of motion normal . Tone & strength normal. Hand joints reveal DIP OA changes Slight hammer toe changes.  Fingernail  health good. Crepitus of knees  Able to lie down & sit up w/o help.  Negative SLR bilaterally Vascular: Carotid, radial artery, dorsalis pedis and  posterior tibial pulses are full and equal. No bruits present. Neurologic: Alert and oriented x3. Deep tendon reflexes symmetrical and normal.  Gait normal     Skin: Intact without suspicious lesions or rashes.Ganglion dorsum L foot Lymph: No cervical, axillary lymphadenopathy present. Psych: Mood and affect are normal. Normally interactive                                                                                       Assessment & Plan:  See Current Assessment & Plan in Problem List under specific Diagnosis

## 2014-12-09 NOTE — Assessment & Plan Note (Signed)
A1c , urine microalbumin, BMET 

## 2014-12-09 NOTE — Assessment & Plan Note (Signed)
TSH 

## 2014-12-09 NOTE — Assessment & Plan Note (Signed)
Blood pressure goals reviewed. BMET 

## 2014-12-09 NOTE — Progress Notes (Signed)
Pre visit review using our clinic review tool, if applicable. No additional management support is needed unless otherwise documented below in the visit note. 

## 2014-12-09 NOTE — Patient Instructions (Signed)
Your next office appointment will be determined based upon review of your pending labs . Those instructions will be transmitted to you through My Chart   Critical values will be called   Minimal Blood Pressure Goal= AVERAGE < 140/90;  Ideal is an AVERAGE < 135/85. This AVERAGE should be calculated from @ least 5-7 BP readings taken @ different times of day on different days of week. You should not respond to isolated BP readings , but rather the AVERAGE for that week .Please bring your  blood pressure cuff to office visits to verify that it is reliable.It  can also be checked against the blood pressure device at the pharmacy. Finger or wrist cuffs are not dependable; an arm cuff is.

## 2014-12-09 NOTE — Assessment & Plan Note (Signed)
Lipids, LFTs, TSH  

## 2015-01-03 ENCOUNTER — Other Ambulatory Visit: Payer: Self-pay | Admitting: Internal Medicine

## 2015-01-20 ENCOUNTER — Ambulatory Visit (INDEPENDENT_AMBULATORY_CARE_PROVIDER_SITE_OTHER): Payer: Commercial Managed Care - HMO | Admitting: Internal Medicine

## 2015-01-20 ENCOUNTER — Encounter: Payer: Self-pay | Admitting: Internal Medicine

## 2015-01-20 VITALS — BP 122/72 | HR 81 | Temp 98.5°F | Ht 67.0 in | Wt 173.1 lb

## 2015-01-20 DIAGNOSIS — J310 Chronic rhinitis: Secondary | ICD-10-CM

## 2015-01-20 DIAGNOSIS — J209 Acute bronchitis, unspecified: Secondary | ICD-10-CM

## 2015-01-20 MED ORDER — HYDROCODONE-HOMATROPINE 5-1.5 MG/5ML PO SYRP: 5.0000 mL | ORAL_SOLUTION | Freq: Four times a day (QID) | ORAL | Status: DC | PRN

## 2015-01-20 MED ORDER — AZITHROMYCIN 250 MG PO TABS: ORAL_TABLET | ORAL | Status: DC

## 2015-01-20 MED ORDER — DOXYCYCLINE HYCLATE 100 MG PO TABS: 100.0000 mg | ORAL_TABLET | Freq: Two times a day (BID) | ORAL | Status: DC

## 2015-01-20 NOTE — Patient Instructions (Signed)

## 2015-01-20 NOTE — Progress Notes (Signed)
   Subjective:    Patient ID: Shannon Greene, female    DOB: 11-25-1933, 81 y.o.   MRN: [id number]  HPI  Her symptoms began the evening of 01/17/15 as a cough and clear rhinitis. She since has been producing brown sputum intermittently. The rhinitis symptoms have persisted. Tylenol has been of minimal benefit.  She has minor sneezing but no other extrinsic or upper respiratory tract infection symptoms.  Review of Systems She denies fever, chills, or sweats. She has no frontal headache or facial pain. She denies itchy, watery eyes. The cough is not associated with shortness of breath or wheezing. There is no nasal purulence.     Objective:   Physical Exam  General appearance:Adequately nourished; no acute distress or increased work of breathing is present.  No  lymphadenopathy about the head, neck, or axilla noted.   Eyes: No conjunctival inflammation or lid edema is present. There is no scleral icterus.  Ears:  External ear exam shows no significant lesions or deformities.  Otoscopic examination reveals clear canals, tympanic membranes are intact bilaterally without bulging, retraction, inflammation or discharge.  Nose:  External nasal examination shows no deformity or inflammation. Nasal mucosa erythematous  without lesions or exudates. No septal dislocation or deviation.No obstruction to airflow.   Oral exam: Dental hygiene is good; lips and gums are healthy appearing.There is no oropharyngeal erythema or exudate noted.   Neck:  No deformities, thyromegaly, masses, or tenderness noted.   Supple with full range of motion without pain.   Heart:  Normal rate and regular rhythm. S1 and S2 normal without gallop, murmur, click, rub or other extra sounds.   Lungs:Chest clear to auscultation; no wheezes, rhonchi,rales ,or rubs present.  Extremities:  No cyanosis, edema, or clubbing  noted    Skin: Warm & dry w/o jaundice or tenting.       Assessment & Plan:  #1 acute bronchitis  w/o bronchospasm #2 rhinitis Plan: See orders and recommendations

## 2015-01-20 NOTE — Progress Notes (Signed)
Pre visit review using our clinic review tool, if applicable. No additional management support is needed unless otherwise documented below in the visit note. 

## 2015-01-24 ENCOUNTER — Telehealth: Payer: Self-pay | Admitting: Internal Medicine

## 2015-01-24 NOTE — Telephone Encounter (Signed)
Doxycycline is the antibiotic used to treat traveler's diarrhea ;it should not cause diarrhea.  Hold Doxycycline. Please take a probiotic , Florastor OR Align, every day if the bowels are loose. This will replace the normal bacteria which  are necessary for formation of normal stool and processing of food.

## 2015-01-24 NOTE — Telephone Encounter (Signed)
Patient states she was prescribe doxycycline.  It is causing her to have diarrhea. She is requesting something else to be sent to [facility] on [address].

## 2015-01-24 NOTE — Telephone Encounter (Signed)
Patient has been advised

## 2015-02-08 ENCOUNTER — Ambulatory Visit (INDEPENDENT_AMBULATORY_CARE_PROVIDER_SITE_OTHER)
Admission: RE | Admit: 2015-02-08 | Discharge: 2015-02-08 | Disposition: A | Discharge status: Home / Self Care | Payer: Commercial Managed Care - HMO | Source: Ambulatory Visit | Attending: Internal Medicine | Admitting: Internal Medicine

## 2015-02-08 ENCOUNTER — Encounter: Payer: Self-pay | Admitting: Internal Medicine

## 2015-02-08 ENCOUNTER — Ambulatory Visit (INDEPENDENT_AMBULATORY_CARE_PROVIDER_SITE_OTHER): Payer: Commercial Managed Care - HMO | Admitting: Internal Medicine

## 2015-02-08 VITALS — BP 130/70 | HR 88 | Temp 98.4°F | Ht 67.0 in | Wt 173.2 lb

## 2015-02-08 DIAGNOSIS — J209 Acute bronchitis, unspecified: Secondary | ICD-10-CM

## 2015-02-08 DIAGNOSIS — R05 Cough: Secondary | ICD-10-CM | POA: Diagnosis not present

## 2015-02-08 MED ORDER — LEVOFLOXACIN 500 MG PO TABS
500.0000 mg | ORAL_TABLET | Freq: Every day | ORAL | Status: DC
Start: 2015-02-08 — End: 2015-07-08

## 2015-02-08 MED ORDER — BENZONATATE 200 MG PO CAPS: 200.0000 mg | ORAL_CAPSULE | Freq: Three times a day (TID) | ORAL | Status: DC | PRN

## 2015-02-08 NOTE — Patient Instructions (Signed)
Plain Mucinex (NOT D) for thick secretions ;force NON dairy fluids .   Nasal cleansing in the shower as discussed with lather of mild shampoo.After 10 seconds wash off lather while  exhaling through nostrils. Make sure that all residual soap is removed to prevent irritation.  Flonase OR Nasacort AQ 1 spray in each nostril twice a day as needed. Use the "crossover" technique into opposite nostril spraying toward opposite ear @ 45 degree angle, not straight up into nostril.  Plain Allegra (NOT D )  160 daily , Loratidine 10 mg , OR Zyrtec 10 mg @ bedtime  as needed for itchy eyes & sneezing. Please take a probiotic , Florastor OR Align, every daywhile on the antibiotic. This will replace the normal bacteria which  are necessary for formation of normal stool and processing of food.

## 2015-02-08 NOTE — Progress Notes (Signed)
   Subjective:    Patient ID: Shannon Greene, female    DOB: June 24, 1934, 81 y.o.   MRN: [id number]  HPI  Doxycycline had been prescribed for acute bronchitis 01/20/15. She stopped this after 2 days due to diarrhea. She now describes pressure in the left ear. She's coughing up yellow sputum; paroxysms of cough are causing incontinence. She also describes postnasal drainage.All nasal secretions are clear. The cough is associated with shortness of breath and has disturbed sleep. She's had fever up to 99.  She states she cannot take penicillins. Zithromax "doesn't work". She states that Cipro has been of benefit.  Review of Systems She is not having frontal headache, facial pain, dental pain, or sore throat.    Objective:   Physical Exam Pertinent or positive findings include: She has very mild erythema of the nasal mucosa.  Her cough is nonproductive. I hear no wheezing or rhonchi. There is no increased work of breathing. Repeat O2 sats were 91%.O2 sats were 96% 01/20/15.  General appearance:Adequately nourished; no acute distress or increased work of breathing is present.   Lymphatic: No  lymphadenopathy about the head, neck, or axilla . Eyes: No conjunctival inflammation or lid edema is present. There is no scleral icterus. Ears:  External ear exam shows no significant lesions or deformities.  Otoscopic examination reveals clear canals, tympanic membranes are intact bilaterally without bulging, retraction, inflammation or discharge. Nose:  External nasal examination shows no deformity or inflammation. No septal dislocation or deviation.No obstruction to airflow.  Oral exam: Dental hygiene is good; lips and gums are healthy appearing.There is no oropharyngeal erythema or exudate . Neck:  No deformities, thyromegaly, masses, or tenderness noted.    Heart:  Normal rate and regular rhythm. S1 and S2 normal without gallop, murmur, click, rub or other extra sounds.  Extremities:  No cyanosis,  edema, or clubbing  Noted.Mixed arthritic hand changes. Skin: Warm & dry w/o tenting or jaundice. No significant lesions or rash.      Assessment & Plan:  #1 acute bronchitis  Plan: Limited options are clinically appropriate. Reluctantly I'll Rx a short course of Levaquin. Tessalon Perles will be ordered.

## 2015-02-08 NOTE — Progress Notes (Signed)
Pre visit review using our clinic review tool, if applicable. No additional management support is needed unless otherwise documented below in the visit note. 

## 2015-03-05 ENCOUNTER — Other Ambulatory Visit: Payer: Self-pay | Admitting: Internal Medicine

## 2015-05-02 ENCOUNTER — Other Ambulatory Visit: Payer: Self-pay

## 2015-05-06 ENCOUNTER — Other Ambulatory Visit: Payer: Self-pay | Admitting: Internal Medicine

## 2015-05-06 ENCOUNTER — Telehealth: Payer: Self-pay | Admitting: Internal Medicine

## 2015-05-06 DIAGNOSIS — M25569 Pain in unspecified knee: Secondary | ICD-10-CM

## 2015-05-06 NOTE — Telephone Encounter (Signed)
Please advise. Does pt need a OV with you before referral

## 2015-05-06 NOTE — Telephone Encounter (Signed)
done

## 2015-05-06 NOTE — Telephone Encounter (Signed)
Patient need a referral to [place] [facility], for knee [employer] [phone]

## 2015-05-30 DIAGNOSIS — M1712 Unilateral primary osteoarthritis, left knee: Secondary | ICD-10-CM | POA: Diagnosis not present

## 2015-05-30 DIAGNOSIS — M25561 Pain in right knee: Secondary | ICD-10-CM | POA: Diagnosis not present

## 2015-06-13 DIAGNOSIS — M1711 Unilateral primary osteoarthritis, right knee: Secondary | ICD-10-CM | POA: Diagnosis not present

## 2015-06-13 DIAGNOSIS — M170 Bilateral primary osteoarthritis of knee: Secondary | ICD-10-CM | POA: Diagnosis not present

## 2015-06-13 DIAGNOSIS — M1712 Unilateral primary osteoarthritis, left knee: Secondary | ICD-10-CM | POA: Diagnosis not present

## 2015-06-28 DIAGNOSIS — H40013 Open angle with borderline findings, low risk, bilateral: Secondary | ICD-10-CM | POA: Diagnosis not present

## 2015-06-28 DIAGNOSIS — H524 Presbyopia: Secondary | ICD-10-CM | POA: Diagnosis not present

## 2015-07-08 ENCOUNTER — Encounter: Payer: Self-pay | Admitting: Internal Medicine

## 2015-07-08 ENCOUNTER — Ambulatory Visit (INDEPENDENT_AMBULATORY_CARE_PROVIDER_SITE_OTHER): Payer: Medicare HMO | Admitting: Internal Medicine

## 2015-07-08 VITALS — BP 138/70 | HR 75 | Temp 97.9°F | Resp 16 | Wt 170.0 lb

## 2015-07-08 DIAGNOSIS — G5601 Carpal tunnel syndrome, right upper limb: Secondary | ICD-10-CM

## 2015-07-08 DIAGNOSIS — I10 Essential (primary) hypertension: Secondary | ICD-10-CM

## 2015-07-08 NOTE — Progress Notes (Signed)
   Subjective:    Patient ID: Shannon Greene, female    DOB: April 21, 1934, 81 y.o.   MRN: [id number]  HPI   The last week she's had intermittent tingling and numbness in the index and third finger of the right hand. There was no trigger or  injury although she does use a computer for prolonged periods of time.  Her blood pressure at home has been in the 130/70 range. She has no active cardiopulmonary symptoms.  She has hypothyroidism. Her last TSH was 0.49 on 12/09/14.  Review of Systems Fever, chills, sweats, or unexplained weight loss not present. No change in color or temperature of the skin in this area. Radicular type pain absent.  Chest pain, palpitations, tachycardia, exertional dyspnea, paroxysmal nocturnal dyspnea, claudication or edema are absent.     Objective:   Physical Exam Pertinent or positive findings include: She has marked degenerative joint changes in the hands. Strength to opposition is excellent in the hands. Deep tendon reflexes are normal in the upper extremities. Tinel's sign is negative.  General appearance :adequately nourished; in no distress.  Eyes: No conjunctival inflammation or scleral icterus is present.  Heart:  Normal rate and regular rhythm. S1 and S2 normal without gallop, murmur, click, rub or other extra sounds    Lungs:Chest clear to auscultation; no wheezes, rhonchi,rales ,or rubs present.No increased work of breathing.   Vascular : all pulses equal ; no bruits present.  Skin:Warm & dry.  Intact without suspicious lesions or rashes ; no tenting or jaundice   Lymphatic: No lymphadenopathy is noted about the head, neck, axilla.   Neuro: Strength, tone & DTRs normal.     Assessment & Plan:  #1 carpal tunnel syndrome, intermittent  #2 hypertension, controlled  See after visit summary

## 2015-07-08 NOTE — Progress Notes (Signed)
Pre visit review using our clinic review tool, if applicable. No additional management support is needed unless otherwise documented below in the visit note. 

## 2015-07-08 NOTE — Patient Instructions (Signed)
Go to [employer] M[employer] for information on Carpal Tunnel Syndrome. Employ a wrist support cushion when using the computer. Wear a wrist splint overnight as needed.  If symptoms persist or progress; nerve conduction/EMG studies would be indicated. If these were abnormal, Hand Surgery referral would be indicated.

## 2015-07-22 ENCOUNTER — Other Ambulatory Visit: Payer: Self-pay | Admitting: Internal Medicine

## 2015-07-25 ENCOUNTER — Other Ambulatory Visit: Payer: Self-pay | Admitting: Emergency Medicine

## 2015-07-25 MED ORDER — RAMIPRIL 5 MG PO CAPS: 5.0000 mg | ORAL_CAPSULE | Freq: Every day | ORAL | Status: DC

## 2015-07-25 MED ORDER — ATORVASTATIN CALCIUM 10 MG PO TABS: 10.0000 mg | ORAL_TABLET | Freq: Every day | ORAL | Status: DC

## 2015-07-25 MED ORDER — HYDROCHLOROTHIAZIDE 12.5 MG PO TABS: ORAL_TABLET | ORAL | Status: DC

## 2015-07-25 MED ORDER — LEVOTHYROXINE SODIUM 50 MCG PO TABS: ORAL_TABLET | ORAL | Status: DC

## 2015-07-28 DIAGNOSIS — Z23 Encounter for immunization: Secondary | ICD-10-CM | POA: Diagnosis not present

## 2015-08-19 ENCOUNTER — Other Ambulatory Visit: Payer: Self-pay

## 2015-08-19 DIAGNOSIS — Z1231 Encounter for screening mammogram for malignant neoplasm of breast: Secondary | ICD-10-CM

## 2015-08-23 ENCOUNTER — Other Ambulatory Visit: Payer: Self-pay | Admitting: Internal Medicine

## 2015-08-23 ENCOUNTER — Telehealth: Payer: Self-pay | Admitting: Internal Medicine

## 2015-08-23 DIAGNOSIS — E119 Type 2 diabetes mellitus without complications: Secondary | ICD-10-CM

## 2015-08-23 NOTE — Telephone Encounter (Signed)
Patient would like to know if she needs to go to the lab for an A1C check

## 2015-08-23 NOTE — Telephone Encounter (Signed)
Please advise. Last A1c 2/16

## 2015-08-23 NOTE — Telephone Encounter (Signed)
Order entered

## 2015-08-24 NOTE — Telephone Encounter (Signed)
Tried calling pt. No voicemail to leave message.

## 2015-08-25 ENCOUNTER — Other Ambulatory Visit (INDEPENDENT_AMBULATORY_CARE_PROVIDER_SITE_OTHER): Payer: Medicare HMO

## 2015-08-25 DIAGNOSIS — E119 Type 2 diabetes mellitus without complications: Secondary | ICD-10-CM

## 2015-08-25 LAB — MICROALBUMIN / CREATININE URINE RATIO
Creatinine,U: 163.6 mg/dL
Microalb Creat Ratio: 0.4 mg/g (ref 0.0–30.0)
Microalb, Ur: 0.7 mg/dL (ref 0.0–1.9)

## 2015-08-25 LAB — HEMOGLOBIN A1C: Hgb A1c MFr Bld: 7 % — ABNORMAL HIGH (ref 4.6–6.5)

## 2015-08-31 NOTE — Telephone Encounter (Signed)
Spoke with pts husband to advise, pt was not home

## 2015-10-06 ENCOUNTER — Ambulatory Visit
Admission: RE | Admit: 2015-10-06 | Discharge: 2015-10-06 | Disposition: A | Discharge status: Home / Self Care | Payer: Commercial Managed Care - HMO | Source: Ambulatory Visit

## 2015-10-06 DIAGNOSIS — Z1231 Encounter for screening mammogram for malignant neoplasm of breast: Secondary | ICD-10-CM | POA: Diagnosis not present

## 2015-12-16 ENCOUNTER — Encounter: Payer: Self-pay | Admitting: Family Medicine

## 2015-12-16 ENCOUNTER — Ambulatory Visit (INDEPENDENT_AMBULATORY_CARE_PROVIDER_SITE_OTHER): Payer: Commercial Managed Care - HMO | Admitting: Family Medicine

## 2015-12-16 VITALS — BP 168/98 | HR 78 | Temp 97.6°F | Resp 16 | Ht 66.25 in | Wt 170.5 lb

## 2015-12-16 DIAGNOSIS — Z0000 Encounter for general adult medical examination without abnormal findings: Secondary | ICD-10-CM | POA: Diagnosis not present

## 2015-12-16 DIAGNOSIS — E119 Type 2 diabetes mellitus without complications: Secondary | ICD-10-CM | POA: Diagnosis not present

## 2015-12-16 DIAGNOSIS — Z23 Encounter for immunization: Secondary | ICD-10-CM | POA: Diagnosis not present

## 2015-12-16 LAB — COMPREHENSIVE METABOLIC PANEL
ALBUMIN: 3.9 g/dL (ref 3.5–5.2)
ALT: 17 U/L (ref 0–35)
AST: 17 U/L (ref 0–37)
Alkaline Phosphatase: 80 U/L (ref 39–117)
BUN: 19 mg/dL (ref 6–23)
CHLORIDE: 101 meq/L (ref 96–112)
CO2: 35 mEq/L — ABNORMAL HIGH (ref 19–32)
CREATININE: 0.77 mg/dL (ref 0.40–1.20)
Calcium: 9.8 mg/dL (ref 8.4–10.5)
GFR: 76.74 mL/min (ref >60.00)
GLUCOSE: 127 mg/dL — AB (ref 70–99)
POTASSIUM: 4.6 meq/L (ref 3.5–5.1)
SODIUM: 142 meq/L (ref 135–145)
Total Bilirubin: 0.8 mg/dL (ref 0.2–1.2)
Total Protein: 6.9 g/dL (ref 6.0–8.3)

## 2015-12-16 LAB — LIPID PANEL
CHOL/HDL RATIO: 3
Cholesterol: 193 mg/dL (ref 0–200)
HDL: 59.2 mg/dL (ref >39.00)
LDL CALC: 107 mg/dL — AB (ref 0–99)
NONHDL: 133.43
Triglycerides: 133 mg/dL (ref 0.0–149.0)
VLDL: 26.6 mg/dL (ref 0.0–40.0)

## 2015-12-16 LAB — HEMOGLOBIN A1C: HEMOGLOBIN A1C: 7.3 % — AB (ref 4.6–6.5)

## 2015-12-16 LAB — TSH: TSH: 0.31 u[IU]/mL — AB (ref 0.35–4.50)

## 2015-12-16 MED ORDER — RAMIPRIL 5 MG PO CAPS: 5.0000 mg | ORAL_CAPSULE | Freq: Every day | ORAL | Status: DC

## 2015-12-16 MED ORDER — HYDROCHLOROTHIAZIDE 12.5 MG PO TABS: ORAL_TABLET | ORAL | Status: DC

## 2015-12-16 NOTE — Progress Notes (Signed)
Pre visit review using our clinic review tool, if applicable. No additional management support is needed unless otherwise documented below in the visit note. 

## 2015-12-16 NOTE — Patient Instructions (Signed)
Check your blood pressure every other day and call or make appt to see me if top number is consistently >150 or bottom number is consistently >95.  Write your numbers down and bring back with you to review with me at next f/u visit.

## 2015-12-16 NOTE — Progress Notes (Signed)
Office Note 12/16/2015  CC:  Chief Complaint  Patient presents with  . Establish Care    Trandfer form Dr. Linna Darner.  . Annual Exam    Pt is fasting.     HPI:  Shannon Greene is a 82 y.o. White female who is here to transfer care and get CPE. Patient's most recent primary MD: Dr. Linna Darner (retired). Old records in [facility] EMR were reviewed prior to or during today's visit. Home bp monitoring shows consistently normal bp. She feels very well.  Very active.  Diet controlled DM.  Compliant with all chronic meds.  Fasting for routine lab monitoring today.  Past Medical History  Diagnosis Date  . Hyperlipemia   . Hypertension   . Hypothyroidism   . Osteoarthritis   . Diabetes mellitus 10/2010    A1c 6.7%   . Diverticulosis   . Transfusion history 1959    post partum    Past Surgical History  Procedure Laterality Date  . Cataract extraction, bilateral      Dr Bing Plume  . Vaginal hysterectomy  1975    For Fibroids (no BSO)  . Cesarean section  [id number]; 1959  . Colectomy      18 inches removed 2002-2003 for Diverticulitis  . Appendectomy  1959  . Colonoscopy      Tics,Dr Sam [place]--pt declines any further screening as of 12/2015.    Family History  Problem Relation Age of Onset  . Heart attack Mother 29  . Hypertension Mother   . Diabetes Brother   . Stroke Brother 40  . Tuberculosis Father   . Prostate cancer Brother     Social History   Social History  . Marital Status: Married    Spouse Name: N/A  . Number of Children: N/A  . Years of Education: N/A   Occupational History  . Not on file.   Social History Main Topics  . Smoking status: Never Smoker   . Smokeless tobacco: Not on file  . Alcohol Use: No  . Drug Use: No  . Sexual Activity: Not on file   Other Topics Concern  . Not on file   Social History Narrative   Married, 2 daughters.   Occupation: retired Facilities manager).   Silver sneakers.   No T/A/Ds.    Outpatient  Encounter Prescriptions as of 12/16/2015  Medication Sig  . aspirin 81 MG tablet Take 81 mg by mouth daily.   Marland Kitchen atorvastatin (LIPITOR) 10 MG tablet Take 1 tablet (10 mg total) by mouth daily.  . cycloSPORINE (RESTASIS) 0.05 % ophthalmic emulsion Place 1 drop into both eyes 2 (two) times daily.    . hydrochlorothiazide (HYDRODIURIL) 12.5 MG tablet TAKE 1 TABLET EVERY DAY- IN  PLACE  OF  METHYCLOTHIAZIDE  . levothyroxine (SYNTHROID, LEVOTHROID) 50 MCG tablet TAKE 1 TABLET EVERY DAY  EXCEPT TAKE 1/2 TABLET ON TUESDAY AND SATURDAY  . ramipril (ALTACE) 5 MG capsule Take 1 capsule (5 mg total) by mouth daily.  . [DISCONTINUED] hydrochlorothiazide (HYDRODIURIL) 12.5 MG tablet TAKE 1 TABLET EVERY DAY- IN  PLACE  OF  METHYCLOTHIAZIDE  . [DISCONTINUED] ramipril (ALTACE) 5 MG capsule Take 1 capsule (5 mg total) by mouth daily.   No facility-administered encounter medications on file as of 12/16/2015.    Allergies  Allergen Reactions  . Doxycycline Hyclate Diarrhea  . Penicillins     Rash Because of a history of documented adverse serious drug reaction;Medi Alert bracelet  is recommended  . Tetanus Toxoid  REACTION: Arm swelling-severe Because of a history of documented adverse serious drug reaction;Medi Alert bracelet  is recommended    ROS Review of Systems  Constitutional: Negative for fever, chills, appetite change and fatigue.  HENT: Negative for congestion, dental problem, ear pain and sore throat.   Eyes: Negative for discharge, redness and visual disturbance.  Respiratory: Negative for cough, chest tightness, shortness of breath and wheezing.   Cardiovascular: Negative for chest pain, palpitations and leg swelling.  Gastrointestinal: Negative for nausea, vomiting, abdominal pain, diarrhea and blood in stool.  Genitourinary: Negative for dysuria, urgency, frequency, hematuria, flank pain and difficulty urinating.  Musculoskeletal: Negative for myalgias, back pain, joint swelling,  arthralgias and neck stiffness.  Skin: Negative for pallor and rash.  Neurological: Negative for dizziness, speech difficulty, weakness and headaches.  Hematological: Negative for adenopathy. Does not bruise/bleed easily.  Psychiatric/Behavioral: Negative for confusion and sleep disturbance. The patient is not nervous/anxious.     PE; Blood pressure 168/98, pulse 78, temperature 97.6 F (36.4 C), temperature source Oral, resp. rate 16, height 5' 6.25" (1.683 m), weight 170 lb 8 oz (77.338 kg), SpO2 91 %. Manual bp repeat 168/98 Gen: Alert, well appearing.  Patient is oriented to person, place, time, and situation. AFFECT: pleasant, lucid thought and speech. ENT: Ears: EACs clear, normal epithelium.  TMs with good light reflex and landmarks bilaterally.  Eyes: no injection, icteris, swelling, or exudate.  EOMI, PERRLA. Nose: no drainage or turbinate edema/swelling.  No injection or focal lesion.  Mouth: lips without lesion/swelling.  Oral mucosa pink and moist.  Dentition intact and without obvious caries or gingival swelling.  Oropharynx without erythema, exudate, or swelling.  Neck: supple/nontender.  No LAD, mass, or TM.  Carotid pulses 2+ bilaterally, without bruits. CV: RRR, no m/r/g.   LUNGS: CTA bilat, nonlabored resps, good aeration in all lung fields. ABD: soft, NT, ND, BS normal.  No hepatospenomegaly or mass.  No bruits. EXT: no clubbing, cyanosis, or edema.  Musculoskeletal: no joint swelling, erythema, warmth, or tenderness.  ROM of all joints intact. Skin - no sores or suspicious lesions or rashes or color changes  Pertinent labs:  Lab Results  Component Value Date   TSH 0.49 12/09/2014   Lab Results  Component Value Date   WBC 6.1 12/09/2014   HGB 15.0 12/09/2014   HCT 43.5 12/09/2014   MCV 88.1 12/09/2014   PLT 215.0 12/09/2014   Lab Results  Component Value Date   CREATININE 0.84 12/09/2014   BUN 21 12/09/2014   NA 141 12/09/2014   K 4.8 12/09/2014   CL 102  12/09/2014   CO2 33* 12/09/2014   Lab Results  Component Value Date   ALT 19 12/09/2014   AST 18 12/09/2014   ALKPHOS 77 12/09/2014   BILITOT 0.8 12/09/2014   Lab Results  Component Value Date   CHOL 178 12/09/2014   Lab Results  Component Value Date   HDL 62.40 12/09/2014   Lab Results  Component Value Date   LDLCALC 94 12/09/2014   Lab Results  Component Value Date   TRIG 109.0 12/09/2014   Lab Results  Component Value Date   CHOLHDL 3 12/09/2014   Lab Results  Component Value Date   HGBA1C 7.0* 08/25/2015   ASSESSMENT AND PLAN:   Transfer patient:  Health maintenance exam: Reviewed age and gender appropriate health maintenance issues (prudent diet, regular exercise, health risks of tobacco and excessive alcohol, use of seatbelts, fire alarms in home, use of sunscreen).  Also reviewed age and gender appropriate health screening as well as vaccine recommendations. Prevnar 13 given today. She is UTD on screening mammograms. She declines any further pelvic exams and declines any further colon cancer screening. Check health panel labs + HbA1c today. Continue regular home bp monitoring and call/return if systolic consistently [id number] or diastolic [id number].  An After Visit Summary was printed and given to the patient.  Return in about 4 months (around 04/14/2016) for routine chronic illness f/u.  If all going well at that time then we'll have her come back for CPE in 8 mo.

## 2015-12-16 NOTE — Addendum Note (Signed)
Addended by: Onalee Hua on: 12/16/2015 10:19 AM   Modules accepted: Orders

## 2015-12-18 LAB — CBC WITH DIFFERENTIAL/PLATELET
BASOS PCT: 0.5 % (ref 0.0–3.0)
Basophils Absolute: 0 10*3/uL (ref 0.0–0.1)
EOS ABS: 0.1 10*3/uL (ref 0.0–0.7)
Eosinophils Relative: 1.3 % (ref 0.0–5.0)
HCT: 48.8 % — ABNORMAL HIGH (ref 36.0–46.0)
Hemoglobin: 14.8 g/dL (ref 12.0–15.0)
LYMPHS ABS: 2.2 10*3/uL (ref 0.7–4.0)
Lymphocytes Relative: 35.9 % (ref 12.0–46.0)
MCHC: 30.6 g/dL (ref 30.0–36.0)
MCV: 99 fl (ref 78.0–100.0)
MONO ABS: 0.6 10*3/uL (ref 0.1–1.0)
Monocytes Relative: 9.5 % (ref 3.0–12.0)
NEUTROS PCT: 52.8 % (ref 43.0–77.0)
Neutro Abs: 3.2 10*3/uL (ref 1.4–7.7)
Platelets: 242 10*3/uL (ref 150.0–400.0)
RBC: 4.93 Mil/uL (ref 3.87–5.11)
RDW: 13.5 % (ref 11.5–15.5)
WBC: 6 10*3/uL (ref 4.0–10.5)

## 2015-12-19 ENCOUNTER — Other Ambulatory Visit: Payer: Self-pay | Admitting: *Deleted

## 2015-12-19 MED ORDER — ATORVASTATIN CALCIUM 20 MG PO TABS: 20.0000 mg | ORAL_TABLET | Freq: Every day | ORAL | Status: DC

## 2015-12-19 MED ORDER — METFORMIN HCL 500 MG PO TABS: 500.0000 mg | ORAL_TABLET | Freq: Two times a day (BID) | ORAL | Status: DC

## 2015-12-20 ENCOUNTER — Other Ambulatory Visit: Payer: Self-pay | Admitting: *Deleted

## 2015-12-20 ENCOUNTER — Encounter: Payer: Self-pay | Admitting: Family Medicine

## 2015-12-20 MED ORDER — LEVOTHYROXINE SODIUM 50 MCG PO TABS: ORAL_TABLET | ORAL | Status: DC

## 2016-04-16 ENCOUNTER — Ambulatory Visit: Payer: Commercial Managed Care - HMO | Admitting: Family Medicine

## 2016-04-19 ENCOUNTER — Ambulatory Visit (INDEPENDENT_AMBULATORY_CARE_PROVIDER_SITE_OTHER): Payer: Commercial Managed Care - HMO | Admitting: Family Medicine

## 2016-04-19 ENCOUNTER — Encounter: Payer: Self-pay | Admitting: Family Medicine

## 2016-04-19 VITALS — BP 134/66 | HR 90 | Temp 97.8°F | Resp 16 | Ht 66.25 in | Wt 169.2 lb

## 2016-04-19 DIAGNOSIS — E119 Type 2 diabetes mellitus without complications: Secondary | ICD-10-CM

## 2016-04-19 DIAGNOSIS — I10 Essential (primary) hypertension: Secondary | ICD-10-CM | POA: Diagnosis not present

## 2016-04-19 DIAGNOSIS — E785 Hyperlipidemia, unspecified: Secondary | ICD-10-CM | POA: Diagnosis not present

## 2016-04-19 LAB — POCT GLYCOSYLATED HEMOGLOBIN (HGB A1C): HEMOGLOBIN A1C: 6.5

## 2016-04-19 NOTE — Progress Notes (Signed)
Pre visit review using our clinic review tool, if applicable. No additional management support is needed unless otherwise documented below in the visit note. 

## 2016-04-19 NOTE — Progress Notes (Signed)
OFFICE VISIT  04/19/2016   CC:  Chief Complaint  Patient presents with  . Follow-up    Pt is not fasting.    HPI:    Patient is a 82 y.o. Caucasian female who presents for 4 mo f/u DM 2, HLD, and HTN. Last visit we added metformin but it caused GI upset/n/v so she d/c'd this.  No home glucose monitoring.  No polyuria or polydipsia.  We also increased her atorv to 20mg  qd last visit but she is not fasting for repeat chol panel today. No side effects from med.  Home bp monitoring reviewed today: avg 130/70.  Feet: no burning, tingling, or numbness.  Past Medical History  Diagnosis Date  . Hyperlipemia   . Hypertension   . Hypothyroidism   . Osteoarthritis   . Diabetes mellitus 10/2010    A1c 6.7%   . Diverticulosis   . Transfusion history 1959    post partum  . Corneal dystrophies, hereditary     Past Surgical History  Procedure Laterality Date  . Cataract extraction, bilateral      Dr Bing Plume  . Vaginal hysterectomy  1975    For Fibroids (no BSO)  . Cesarean section  [id number]; 1959  . Colectomy      18 inches removed 2002-2003 for Diverticulitis  . Appendectomy  1959  . Colonoscopy      Tics,Dr Sam [place]--pt declines any further screening as of 12/2015.    Outpatient Prescriptions Prior to Visit  Medication Sig Dispense Refill  . aspirin 81 MG tablet Take 81 mg by mouth daily.     Marland Kitchen atorvastatin (LIPITOR) 20 MG tablet Take 1 tablet (20 mg total) by mouth daily. 90 tablet 1  . cycloSPORINE (RESTASIS) 0.05 % ophthalmic emulsion Place 1 drop into both eyes 2 (two) times daily.      . hydrochlorothiazide (HYDRODIURIL) 12.5 MG tablet TAKE 1 TABLET EVERY DAY- IN  PLACE  OF  METHYCLOTHIAZIDE 90 tablet 1  . levothyroxine (SYNTHROID, LEVOTHROID) 50 MCG tablet TAKE 1 TABLET EVERY DAY  EXCEPT TAKE 1/2 TABLET ON TUESDAY AND SATURDAY 78 tablet 3  . ramipril (ALTACE) 5 MG capsule Take 1 capsule (5 mg total) by mouth daily. 90 capsule 1  . metFORMIN (GLUCOPHAGE) 500 MG tablet  Take 1 tablet (500 mg total) by mouth 2 (two) times daily with a meal. (Patient not taking: Reported on 04/19/2016) 60 tablet 6   No facility-administered medications prior to visit.    Allergies  Allergen Reactions  . Doxycycline Hyclate Diarrhea  . Penicillins     Rash Because of a history of documented adverse serious drug reaction;Medi Alert bracelet  is recommended  . Tetanus Toxoid     REACTION: Arm swelling-severe Because of a history of documented adverse serious drug reaction;Medi Alert bracelet  is recommended  . Metformin And Related Nausea And Vomiting and Other (See Comments)    Abdominal pain    ROS As per HPI  PE: Blood pressure 149/89, pulse 90, temperature 97.8 F (36.6 C), temperature source Oral, resp. rate 16, height 5' 6.25" (1.683 m), weight 169 lb 4 oz (76.771 kg), SpO2 94 %. Manual bp repeat was 134/66 today. Gen: Alert, well appearing.  Patient is oriented to person, place, time, and situation. CV: RRR, no m/r/g.   LUNGS: CTA bilat, nonlabored resps, good aeration in all lung fields. EXT: no clubbing, cyanosis, or edema.  Foot exam - bilateral normal; no swelling, tenderness or skin or vascular lesions. Color and temperature  is normal. Sensation is intact. Peripheral pulses are palpable. Toenails are thick but not long.   LABS:  POCT HbA1c today was    Chemistry      Component Value Date/Time   NA 142 12/16/2015 1011   K 4.6 12/16/2015 1011   CL 101 12/16/2015 1011   CO2 35* 12/16/2015 1011   BUN 19 12/16/2015 1011   CREATININE 0.77 12/16/2015 1011      Component Value Date/Time   CALCIUM 9.8 12/16/2015 1011   ALKPHOS 80 12/16/2015 1011   AST 17 12/16/2015 1011   ALT 17 12/16/2015 1011   BILITOT 0.8 12/16/2015 1011       IMPRESSION AND PLAN:  1) DM 2; doing well with diet.  Metformin- intolerant. POCT HbA1c today was 6.5%. Feet exam normal today.  2) HTN; The current medical regimen is effective;  continue present plan and  medications. Lytes/cr excellent 4 mo ago.  3) HLD: tolerating increased dose of atorvastatin. We'll repeat fasting lipid panel at her AWV in 2 mo since she is not fasting today.  An After Visit Summary was printed and given to the patient.  FOLLOW UP: Return in about 2 months (around 06/19/2016) for Medicare AWV-subsequent (FASTING!).  Signed:  Crissie Sickles, MD           04/19/2016

## 2016-05-05 DIAGNOSIS — M47816 Spondylosis without myelopathy or radiculopathy, lumbar region: Secondary | ICD-10-CM

## 2016-05-05 HISTORY — DX: Spondylosis without myelopathy or radiculopathy, lumbar region: M47.816

## 2016-05-21 ENCOUNTER — Encounter: Payer: Self-pay | Admitting: Family Medicine

## 2016-05-21 ENCOUNTER — Encounter (HOSPITAL_BASED_OUTPATIENT_CLINIC_OR_DEPARTMENT_OTHER): Payer: Self-pay

## 2016-05-21 ENCOUNTER — Ambulatory Visit (INDEPENDENT_AMBULATORY_CARE_PROVIDER_SITE_OTHER): Payer: Commercial Managed Care - HMO | Admitting: Family Medicine

## 2016-05-21 ENCOUNTER — Ambulatory Visit (HOSPITAL_BASED_OUTPATIENT_CLINIC_OR_DEPARTMENT_OTHER)
Admission: RE | Admit: 2016-05-21 | Discharge: 2016-05-21 | Disposition: A | Discharge status: Home / Self Care | Payer: Commercial Managed Care - HMO | Source: Ambulatory Visit | Attending: Family Medicine | Admitting: Family Medicine

## 2016-05-21 ENCOUNTER — Other Ambulatory Visit: Payer: Self-pay | Admitting: Family Medicine

## 2016-05-21 VITALS — BP 117/87 | HR 82 | Temp 98.3°F | Resp 16 | Ht 66.25 in | Wt 169.0 lb

## 2016-05-21 DIAGNOSIS — M5136 Other intervertebral disc degeneration, lumbar region: Secondary | ICD-10-CM | POA: Diagnosis not present

## 2016-05-21 DIAGNOSIS — X58XXXA Exposure to other specified factors, initial encounter: Secondary | ICD-10-CM | POA: Insufficient documentation

## 2016-05-21 DIAGNOSIS — S39012A Strain of muscle, fascia and tendon of lower back, initial encounter: Secondary | ICD-10-CM

## 2016-05-21 DIAGNOSIS — M47896 Other spondylosis, lumbar region: Secondary | ICD-10-CM | POA: Insufficient documentation

## 2016-05-21 DIAGNOSIS — I700 Atherosclerosis of aorta: Secondary | ICD-10-CM | POA: Diagnosis not present

## 2016-05-21 HISTORY — DX: Spondylosis without myelopathy or radiculopathy, lumbar region: M47.816

## 2016-05-21 NOTE — Progress Notes (Signed)
OFFICE VISIT  05/21/2016   CC:  Chief Complaint  Patient presents with  . Back Pain    lower right side x 1 month   HPI:    Patient is a 82 y.o. Caucasian female who presents for right sided low back pain.  Onset approx 1 mo ago. Hurts when sitting long periods.  Doesn't hurt when she walks.  No fall or trauma preceding this. The pain doesn't radiate.  Occasionally, positions she sleeps in on her back cause some pain. No radiation of the pain.  No paresthesias.  Tried aspercream with lidocaine otc, tried heat: helps short term. Occ tylenol.  No saddle anesthesia.  No loss of bowel/bladder control.   Past Medical History  Diagnosis Date  . Hyperlipemia   . Hypertension   . Hypothyroidism   . Osteoarthritis   . Diabetes mellitus 10/2010    A1c 6.7%   . Diverticulosis   . Transfusion history 1959    post partum  . Corneal dystrophies, hereditary     Past Surgical History  Procedure Laterality Date  . Cataract extraction, bilateral      Dr Bing Plume  . Vaginal hysterectomy  1975    For Fibroids (no BSO)  . Cesarean section  [id number]; 1959  . Colectomy      18 inches removed 2002-2003 for Diverticulitis  . Appendectomy  1959  . Colonoscopy      Tics,Dr Sam [place]--pt declines any further screening as of 12/2015.    Outpatient Prescriptions Prior to Visit  Medication Sig Dispense Refill  . aspirin 81 MG tablet Take 81 mg by mouth daily.     Marland Kitchen atorvastatin (LIPITOR) 20 MG tablet TAKE 1 TABLET EVERY DAY 90 tablet 3  . cycloSPORINE (RESTASIS) 0.05 % ophthalmic emulsion Place 1 drop into both eyes 2 (two) times daily.      . hydrochlorothiazide (HYDRODIURIL) 12.5 MG tablet TAKE 1 TABLET EVERY DAY- IN  PLACE  OF  METHYCLOTHIAZIDE 90 tablet 1  . levothyroxine (SYNTHROID, LEVOTHROID) 50 MCG tablet TAKE 1 TABLET EVERY DAY  EXCEPT TAKE 1/2 TABLET ON TUESDAY AND SATURDAY 78 tablet 3  . ramipril (ALTACE) 5 MG capsule Take 1 capsule (5 mg total) by mouth daily. 90 capsule 1   No  facility-administered medications prior to visit.    Allergies  Allergen Reactions  . Doxycycline Hyclate Diarrhea  . Penicillins     Rash Because of a history of documented adverse serious drug reaction;Medi Alert bracelet  is recommended  . Tetanus Toxoid     REACTION: Arm swelling-severe Because of a history of documented adverse serious drug reaction;Medi Alert bracelet  is recommended  . Metformin And Related Nausea And Vomiting and Other (See Comments)    Abdominal pain    ROS As per HPI  PE: Blood pressure 117/87, pulse 82, temperature 98.3 F (36.8 C), temperature source Oral, resp. rate 16, height 5' 6.25" (1.683 m), weight 169 lb (76.658 kg), SpO2 94 %. Gen: Alert, well appearing.  Patient is oriented to person, place, time, and situation. AFFECT: pleasant, lucid thought and speech. BACK: L spine ROM intact fully, with just minimal pulling pain in R LB with rotation to the right. TTP focally at paraspinous soft tissues of L4-5 level, facet joint region here as well. LE strength 5/5 prox/dist bilat. DTRs: trace in patellar and achilles regions bilat Sitting SLR neg bilat  LABS:  None today    Chemistry      Component Value Date/Time   NA  142 12/16/2015 1011   K 4.6 12/16/2015 1011   CL 101 12/16/2015 1011   CO2 35* 12/16/2015 1011   BUN 19 12/16/2015 1011   CREATININE 0.77 12/16/2015 1011      Component Value Date/Time   CALCIUM 9.8 12/16/2015 1011   ALKPHOS 80 12/16/2015 1011   AST 17 12/16/2015 1011   ALT 17 12/16/2015 1011   BILITOT 0.8 12/16/2015 1011      IMPRESSION AND PLAN:  Acute low back strain vs facet joint arthritis. Favor more of soft tissue strain. Recommended PT but pt wanted to avoid this for now. I printed out some home stretches she could do for her low back, and I recommended anti-inflammatory consistently for 10d (Take 2 otc aleve or 3 otc ibuprofen TWICE a day with food for the next 10 days). Finally, given pt's age, I did order  a plain film L spine series.  An After Visit Summary was printed and given to the patient.  FOLLOW UP: Return if symptoms worsen or fail to improve.  Signed:  Crissie Sickles, MD           05/21/2016

## 2016-05-21 NOTE — Patient Instructions (Signed)
Take 2 otc aleve or 3 otc ibuprofen TWICE a day with food for the next 10 days.  Low Back Strain With Rehab A strain is an injury in which a tendon or muscle is torn. The muscles and tendons of the lower back are vulnerable to strains. However, these muscles and tendons are very strong and require a great force to be injured. Strains are classified into three categories. Grade 1 strains cause pain, but the tendon is not lengthened. Grade 2 strains include a lengthened ligament, due to the ligament being stretched or partially ruptured. With grade 2 strains there is still function, although the function may be decreased. Grade 3 strains involve a complete tear of the tendon or muscle, and function is usually impaired. SYMPTOMS   Pain in the lower back.  Pain that affects one side more than the other.  Pain that gets worse with movement and may be felt in the hip, buttocks, or back of the thigh.  Muscle spasms of the muscles in the back.  Swelling along the muscles of the back.  Loss of strength of the back muscles.  Crackling sound (crepitation) when the muscles are touched. CAUSES  Lower back strains occur when a force is placed on the muscles or tendons that is greater than they can handle. Common causes of injury include:  Prolonged overuse of the muscle-tendon units in the lower back, usually from incorrect posture.  A single violent injury or force applied to the back. RISK INCREASES WITH:  Sports that involve twisting forces on the spine or a lot of bending at the waist (football, rugby, weightlifting, bowling, golf, tennis, speed skating, racquetball, swimming, running, gymnastics, diving).  Poor strength and flexibility.  Failure to warm up properly before activity.  Family history of lower back pain or disk disorders.  Previous back injury or surgery (especially fusion).  Poor posture with lifting, especially heavy objects.  Prolonged sitting, especially with poor  posture. PREVENTION   Learn and use proper posture when sitting or lifting (maintain proper posture when sitting, lift using the knees and legs, not at the waist).  Warm up and stretch properly before activity.  Allow for adequate recovery between workouts.  Maintain physical fitness:  Strength, flexibility, and endurance.  Cardiovascular fitness. PROGNOSIS  If treated properly, lower back strains usually heal within 6 weeks. RELATED COMPLICATIONS   Recurring symptoms, resulting in a chronic problem.  Chronic inflammation, scarring, and partial muscle-tendon tear.  Delayed healing or resolution of symptoms.  Prolonged disability. TREATMENT  Treatment first involves the use of ice and medicine, to reduce pain and inflammation. The use of strengthening and stretching exercises may help reduce pain with activity. These exercises may be performed at home or with a therapist. Severe injuries may require referral to a therapist for further evaluation and treatment, such as ultrasound. Your caregiver may advise that you wear a back brace or corset, to help reduce pain and discomfort. Often, prolonged bed rest results in greater harm then benefit. Corticosteroid injections may be recommended. However, these should be reserved for the most serious cases. It is important to avoid using your back when lifting objects. At night, sleep on your back on a firm mattress with a pillow placed under your knees. If non-surgical treatment is unsuccessful, surgery may be needed.  MEDICATION   If pain medicine is needed, nonsteroidal anti-inflammatory medicines (aspirin and ibuprofen), or other minor pain relievers (acetaminophen), are often advised.  Do not take pain medicine for 7 days  before surgery.  Prescription pain relievers may be given, if your caregiver thinks they are needed. Use only as directed and only as much as you need.  Ointments applied to the skin may be helpful.  Corticosteroid  injections may be given by your caregiver. These injections should be reserved for the most serious cases, because they may only be given a certain number of times. HEAT AND COLD  Cold treatment (icing) should be applied for 10 to 15 minutes every 2 to 3 hours for inflammation and pain, and immediately after activity that aggravates your symptoms. Use ice packs or an ice massage.  Heat treatment may be used before performing stretching and strengthening activities prescribed by your caregiver, physical therapist, or athletic trainer. Use a heat pack or a warm water soak. SEEK MEDICAL CARE IF:   Symptoms get worse or do not improve in 2 to 4 weeks, despite treatment.  You develop numbness, weakness, or loss of bowel or bladder function.  New, unexplained symptoms develop. (Drugs used in treatment may produce side effects.) EXERCISES  RANGE OF MOTION (ROM) AND STRETCHING EXERCISES - Low Back Strain Most people with lower back pain will find that their symptoms get worse with excessive bending forward (flexion) or arching at the lower back (extension). The exercises which will help resolve your symptoms will focus on the opposite motion.  Your physician, physical therapist or athletic trainer will help you determine which exercises will be most helpful to resolve your lower back pain. Do not complete any exercises without first consulting with your caregiver. Discontinue any exercises which make your symptoms worse until you speak to your caregiver.  If you have pain, numbness or tingling which travels down into your buttocks, leg or foot, the goal of the therapy is for these symptoms to move closer to your back and eventually resolve. Sometimes, these leg symptoms will get better, but your lower back pain may worsen. This is typically an indication of progress in your rehabilitation. Be very alert to any changes in your symptoms and the activities in which you participated in the 24 hours prior to the  change. Sharing this information with your caregiver will allow him/her to most efficiently treat your condition.  These exercises may help you when beginning to rehabilitate your injury. Your symptoms may resolve with or without further involvement from your physician, physical therapist or athletic trainer. While completing these exercises, remember:  Restoring tissue flexibility helps normal motion to return to the joints. This allows healthier, less painful movement and activity.  An effective stretch should be held for at least 30 seconds.  A stretch should never be painful. You should only feel a gentle lengthening or release in the stretched tissue. FLEXION RANGE OF MOTION AND STRETCHING EXERCISES: STRETCH - Flexion, Single Knee to Chest   Lie on a firm bed or floor with both legs extended in front of you.  Keeping one leg in contact with the floor, bring your opposite knee to your chest. Hold your leg in place by either grabbing behind your thigh or at your knee.  Pull until you feel a gentle stretch in your lower back. Hold __________ seconds.  Slowly release your grasp and repeat the exercise with the opposite side. Repeat __________ times. Complete this exercise __________ times per day.  STRETCH - Flexion, Double Knee to Chest   Lie on a firm bed or floor with both legs extended in front of you.  Keeping one leg in contact with the  floor, bring your opposite knee to your chest.  Tense your stomach muscles to support your back and then lift your other knee to your chest. Hold your legs in place by either grabbing behind your thighs or at your knees.  Pull both knees toward your chest until you feel a gentle stretch in your lower back. Hold __________ seconds.  Tense your stomach muscles and slowly return one leg at a time to the floor. Repeat __________ times. Complete this exercise __________ times per day.  STRETCH - Low Trunk Rotation  Lie on a firm bed or floor.  Keeping your legs in front of you, bend your knees so they are both pointed toward the ceiling and your feet are flat on the floor.  Extend your arms out to the side. This will stabilize your upper body by keeping your shoulders in contact with the floor.  Gently and slowly drop both knees together to one side until you feel a gentle stretch in your lower back. Hold for __________ seconds.  Tense your stomach muscles to support your lower back as you bring your knees back to the starting position. Repeat the exercise to the other side. Repeat __________ times. Complete this exercise __________ times per day  EXTENSION RANGE OF MOTION AND FLEXIBILITY EXERCISES: STRETCH - Extension, Prone on Elbows   Lie on your stomach on the floor, a bed will be too soft. Place your palms about shoulder width apart and at the height of your head.  Place your elbows under your shoulders. If this is too painful, stack pillows under your chest.  Allow your body to relax so that your hips drop lower and make contact more completely with the floor.  Hold this position for __________ seconds.  Slowly return to lying flat on the floor. Repeat __________ times. Complete this exercise __________ times per day.  RANGE OF MOTION - Extension, Prone Press Ups  Lie on your stomach on the floor, a bed will be too soft. Place your palms about shoulder width apart and at the height of your head.  Keeping your back as relaxed as possible, slowly straighten your elbows while keeping your hips on the floor. You may adjust the placement of your hands to maximize your comfort. As you gain motion, your hands will come more underneath your shoulders.  Hold this position __________ seconds.  Slowly return to lying flat on the floor. Repeat __________ times. Complete this exercise __________ times per day.  RANGE OF MOTION- Quadruped, Neutral Spine   Assume a hands and knees position on a firm surface. Keep your hands under  your shoulders and your knees under your hips. You may place padding under your knees for comfort.  Drop your head and point your tail bone toward the ground below you. This will round out your lower back like an angry cat. Hold this position for __________ seconds.  Slowly lift your head and release your tail bone so that your back sags into a large arch, like an old horse.  Hold this position for __________ seconds.  Repeat this until you feel limber in your lower back.  Now, find your "sweet spot." This will be the most comfortable position somewhere between the two previous positions. This is your neutral spine. Once you have found this position, tense your stomach muscles to support your lower back.  Hold this position for __________ seconds. Repeat __________ times. Complete this exercise __________ times per day.  STRENGTHENING EXERCISES - Low Back Strain  These exercises may help you when beginning to rehabilitate your injury. These exercises should be done near your "sweet spot." This is the neutral, low-back arch, somewhere between fully rounded and fully arched, that is your least painful position. When performed in this safe range of motion, these exercises can be used for people who have either a flexion or extension based injury. These exercises may resolve your symptoms with or without further involvement from your physician, physical therapist or athletic trainer. While completing these exercises, remember:   Muscles can gain both the endurance and the strength needed for everyday activities through controlled exercises.  Complete these exercises as instructed by your physician, physical therapist or athletic trainer. Increase the resistance and repetitions only as guided.  You may experience muscle soreness or fatigue, but the pain or discomfort you are trying to eliminate should never worsen during these exercises. If this pain does worsen, stop and make certain you are  following the directions exactly. If the pain is still present after adjustments, discontinue the exercise until you can discuss the trouble with your caregiver. STRENGTHENING - Deep Abdominals, Pelvic Tilt  Lie on a firm bed or floor. Keeping your legs in front of you, bend your knees so they are both pointed toward the ceiling and your feet are flat on the floor.  Tense your lower abdominal muscles to press your lower back into the floor. This motion will rotate your pelvis so that your tail bone is scooping upwards rather than pointing at your feet or into the floor.  With a gentle tension and even breathing, hold this position for __________ seconds. Repeat __________ times. Complete this exercise __________ times per day.  STRENGTHENING - Abdominals, Crunches   Lie on a firm bed or floor. Keeping your legs in front of you, bend your knees so they are both pointed toward the ceiling and your feet are flat on the floor. Cross your arms over your chest.  Slightly tip your chin down without bending your neck.  Tense your abdominals and slowly lift your trunk high enough to just clear your shoulder blades. Lifting higher can put excessive stress on the lower back and does not further strengthen your abdominal muscles.  Control your return to the starting position. Repeat __________ times. Complete this exercise __________ times per day.  STRENGTHENING - Quadruped, Opposite UE/LE Lift   Assume a hands and knees position on a firm surface. Keep your hands under your shoulders and your knees under your hips. You may place padding under your knees for comfort.  Find your neutral spine and gently tense your abdominal muscles so that you can maintain this position. Your shoulders and hips should form a rectangle that is parallel with the floor and is not twisted.  Keeping your trunk steady, lift your right hand no higher than your shoulder and then your left leg no higher than your hip. Make sure  you are not holding your breath. Hold this position __________ seconds.  Continuing to keep your abdominal muscles tense and your back steady, slowly return to your starting position. Repeat with the opposite arm and leg. Repeat __________ times. Complete this exercise __________ times per day.  STRENGTHENING - Lower Abdominals, Double Knee Lift  Lie on a firm bed or floor. Keeping your legs in front of you, bend your knees so they are both pointed toward the ceiling and your feet are flat on the floor.  Tense your abdominal muscles to brace your lower back and  slowly lift both of your knees until they come over your hips. Be certain not to hold your breath.  Hold __________ seconds. Using your abdominal muscles, return to the starting position in a slow and controlled manner. Repeat __________ times. Complete this exercise __________ times per day.  POSTURE AND BODY MECHANICS CONSIDERATIONS - Low Back Strain Keeping correct posture when sitting, standing or completing your activities will reduce the stress put on different body tissues, allowing injured tissues a chance to heal and limiting painful experiences. The following are general guidelines for improved posture. Your physician or physical therapist will provide you with any instructions specific to your needs. While reading these guidelines, remember:  The exercises prescribed by your provider will help you have the flexibility and strength to maintain correct postures.  The correct posture provides the best environment for your joints to work. All of your joints have less wear and tear when properly supported by a spine with good posture. This means you will experience a healthier, less painful body.  Correct posture must be practiced with all of your activities, especially prolonged sitting and standing. Correct posture is as important when doing repetitive low-stress activities (typing) as it is when doing a single heavy-load activity  (lifting). RESTING POSITIONS Consider which positions are most painful for you when choosing a resting position. If you have pain with flexion-based activities (sitting, bending, stooping, squatting), choose a position that allows you to rest in a less flexed posture. You would want to avoid curling into a fetal position on your side. If your pain worsens with extension-based activities (prolonged standing, working overhead), avoid resting in an extended position such as sleeping on your stomach. Most people will find more comfort when they rest with their spine in a more neutral position, neither too rounded nor too arched. Lying on a non-sagging bed on your side with a pillow between your knees, or on your back with a pillow under your knees will often provide some relief. Keep in mind, being in any one position for a prolonged period of time, no matter how correct your posture, can still lead to stiffness. PROPER SITTING POSTURE In order to minimize stress and discomfort on your spine, you must sit with correct posture. Sitting with good posture should be effortless for a healthy body. Returning to good posture is a gradual process. Many people can work toward this most comfortably by using various supports until they have the flexibility and strength to maintain this posture on their own. When sitting with proper posture, your ears will fall over your shoulders and your shoulders will fall over your hips. You should use the back of the chair to support your upper back. Your lower back will be in a neutral position, just slightly arched. You may place a small pillow or folded towel at the base of your lower back for support.  When working at a desk, create an environment that supports good, upright posture. Without extra support, muscles tire, which leads to excessive strain on joints and other tissues. Keep these recommendations in mind: CHAIR:  A chair should be able to slide under your desk when your  back makes contact with the back of the chair. This allows you to work closely.  The chair's height should allow your eyes to be level with the upper part of your monitor and your hands to be slightly lower than your elbows. BODY POSITION  Your feet should make contact with the floor. If this is not  possible, use a foot rest.  Keep your ears over your shoulders. This will reduce stress on your neck and lower back. INCORRECT SITTING POSTURES  If you are feeling tired and unable to assume a healthy sitting posture, do not slouch or slump. This puts excessive strain on your back tissues, causing more damage and pain. Healthier options include:  Using more support, like a lumbar pillow.  Switching tasks to something that requires you to be upright or walking.  Talking a brief walk.  Lying down to rest in a neutral-spine position. PROLONGED STANDING WHILE SLIGHTLY LEANING FORWARD  When completing a task that requires you to lean forward while standing in one place for a long time, place either foot up on a stationary 2-4 inch high object to help maintain the best posture. When both feet are on the ground, the lower back tends to lose its slight inward curve. If this curve flattens (or becomes too large), then the back and your other joints will experience too much stress, tire more quickly, and can cause pain. CORRECT STANDING POSTURES Proper standing posture should be assumed with all daily activities, even if they only take a few moments, like when brushing your teeth. As in sitting, your ears should fall over your shoulders and your shoulders should fall over your hips. You should keep a slight tension in your abdominal muscles to brace your spine. Your tailbone should point down to the ground, not behind your body, resulting in an over-extended swayback posture.  INCORRECT STANDING POSTURES  Common incorrect standing postures include a forward head, locked knees and/or an excessive  swayback. WALKING Walk with an upright posture. Your ears, shoulders and hips should all line-up. PROLONGED ACTIVITY IN A FLEXED POSITION When completing a task that requires you to bend forward at your waist or lean over a low surface, try to find a way to stabilize 3 out of 4 of your limbs. You can place a hand or elbow on your thigh or rest a knee on the surface you are reaching across. This will provide you more stability so that your muscles do not fatigue as quickly. By keeping your knees relaxed, or slightly bent, you will also reduce stress across your lower back. CORRECT LIFTING TECHNIQUES DO :   Assume a wide stance. This will provide you more stability and the opportunity to get as close as possible to the object which you are lifting.  Tense your abdominals to brace your spine. Bend at the knees and hips. Keeping your back locked in a neutral-spine position, lift using your leg muscles. Lift with your legs, keeping your back straight.  Test the weight of unknown objects before attempting to lift them.  Try to keep your elbows locked down at your sides in order get the best strength from your shoulders when carrying an object.  Always ask for help when lifting heavy or awkward objects. INCORRECT LIFTING TECHNIQUES DO NOT:   Lock your knees when lifting, even if it is a small object.  Bend and twist. Pivot at your feet or move your feet when needing to change directions.  Assume that you can safely pick up even a paper clip without proper posture.   This information is not intended to replace advice given to you by your health care provider. Make sure you discuss any questions you have with your health care provider.   Document Released: 10/22/2005 Document Revised: 11/12/2014 Document Reviewed: 02/03/2009 [employer] Interactive Patient Education [employer].

## 2016-05-21 NOTE — Progress Notes (Signed)
Pre visit review using our clinic review tool, if applicable. No additional management support is needed unless otherwise documented below in the visit note. 

## 2016-05-21 NOTE — Telephone Encounter (Signed)
RF request for atorvastatin LOV: 04/19/16 Next ov: 06/19/16 Last written: 12/19/15 #90 w/ 1RF

## 2016-05-23 DIAGNOSIS — H04123 Dry eye syndrome of bilateral lacrimal glands: Secondary | ICD-10-CM | POA: Diagnosis not present

## 2016-05-23 DIAGNOSIS — H1850 Unspecified hereditary corneal dystrophies: Secondary | ICD-10-CM | POA: Diagnosis not present

## 2016-05-23 DIAGNOSIS — H40013 Open angle with borderline findings, low risk, bilateral: Secondary | ICD-10-CM | POA: Diagnosis not present

## 2016-06-19 ENCOUNTER — Ambulatory Visit (INDEPENDENT_AMBULATORY_CARE_PROVIDER_SITE_OTHER): Payer: Commercial Managed Care - HMO | Admitting: Family Medicine

## 2016-06-19 ENCOUNTER — Encounter: Payer: Self-pay | Admitting: Family Medicine

## 2016-06-19 VITALS — BP 143/81 | HR 75 | Temp 97.5°F | Resp 16 | Ht 66.0 in | Wt 167.2 lb

## 2016-06-19 DIAGNOSIS — E785 Hyperlipidemia, unspecified: Secondary | ICD-10-CM

## 2016-06-19 DIAGNOSIS — E348 Other specified endocrine disorders: Secondary | ICD-10-CM

## 2016-06-19 DIAGNOSIS — Z0000 Encounter for general adult medical examination without abnormal findings: Secondary | ICD-10-CM

## 2016-06-19 LAB — LIPID PANEL
Cholesterol: 177 mg/dL (ref 0–200)
HDL: 62 mg/dL (ref >39.00)
LDL Cholesterol: 95 mg/dL (ref 0–99)
NonHDL: 115.09
Total CHOL/HDL Ratio: 3
Triglycerides: 98 mg/dL (ref 0.0–149.0)
VLDL: 19.6 mg/dL (ref 0.0–40.0)

## 2016-06-19 NOTE — Progress Notes (Signed)
The patient is here for annual Medicare wellness examination and management of other chronic and acute problems. Other problems discussed today: needs f/u fasting lipid panel since having her atorvastatin increased to 20mg  qd 4 mo ago.  Lab Results  Component Value Date   TSH 0.31 (L) 12/16/2015   Lab Results  Component Value Date   WBC 6.0 12/16/2015   HGB 14.8 12/16/2015   HCT 48.8 (H) 12/16/2015   MCV 99.0 12/16/2015   PLT 242.0 12/16/2015   Lab Results  Component Value Date   CREATININE 0.77 12/16/2015   BUN 19 12/16/2015   NA 142 12/16/2015   K 4.6 12/16/2015   CL 101 12/16/2015   CO2 35 (H) 12/16/2015   Lab Results  Component Value Date   ALT 17 12/16/2015   AST 17 12/16/2015   ALKPHOS 80 12/16/2015   BILITOT 0.8 12/16/2015   Lab Results  Component Value Date   CHOL 193 12/16/2015   Lab Results  Component Value Date   HDL 59.20 12/16/2015   Lab Results  Component Value Date   LDLCALC 107 (H) 12/16/2015   Lab Results  Component Value Date   TRIG 133.0 12/16/2015   Lab Results  Component Value Date   CHOLHDL 3 12/16/2015   Lab Results  Component Value Date   HGBA1C 6.5 04/19/2016    AWV DATA The risk factors are reflected in the social history.  The roster of all physicians providing medical care to patient is listed in the Snapshot section of the chart.  Activities of daily living:  The patient is 100% independent in all ADLs: dressing, toileting, feeding as well as independent mobility.  Home safety : The patient has smoke detectors in the home. They wear seatbelts. No firearms at home ( firearms are present in the home, kept in a safe fashion). There is no violence in the home.   There is no risks for hepatitis, STDs or HIV. There is no history of blood transfusion. They have no travel history to infectious disease endemic areas of the world.  The patient has seen their dentist in the last six month. They have seen their eye doctor in the last  year. They deny any hearing difficulty and have not had audiologic testing in the last year.  They do not  have excessive sun exposure. Discussed the need for sun protection: hats, long sleeves and use of sunscreen if there is significant sun exposure.   Diet: the importance of a healthy diet is discussed. They do have a healthy diet.  The patient does not have a regular exercise program.  She emphasizes that she is very active around her house and yard.  The benefits of regular aerobic exercise were discussed.  Depression screen: there are no signs or vegative symptoms of depression- irritability, change in appetite, anhedonia, sadness/tearfullness.  Cognitive assessment: the patient manages all their financial and personal affairs and is actively engaged. They could relate day,date,year and events; recalled 3/3 objects at 3 minutes; performed clock-face test normally.  Reviewed advance directives with pt today.  Pt has this in place.  The following portions of the patient's history were reviewed and updated as appropriate: allergies, current medications, past family history, past medical history,  past surgical history, past social history  and problem list.  Vision, hearing, body mass index were assessed and reviewed.  Body mass index is 26.99 kg/m.   During the course of the visit the patient was educated and counseled about appropriate  screening and preventive services including :  Annual wellness visit: doing this today. diabetes screening: n/a colorectal cancer screening: pt declines any further screening. recommended immunizations (influenza, pneumococcal, Hep B): Pt is UTD on these. Bone mass measurement: 2014 DEXA normal.  Will order repeat today. Counseling to prevent tobacco use: n/a Depression screening: done today Glaucoma screening: via eye MD's office. Hepatitis C virus screening: pt doesn't qualify. HIV virus screening: pt declines Lung cancer screening: pt doesn't  qualify. Medical nutrition therapy: pt declines this. Prostate cancer screening: n/a Screening mammography: pt to arrange per her usual routine 10/2016. Screening pap tests, pelvic exam, and clinical breast exam: pt doesn't qualify due to having hysterectomy for benign reasons. Ultrasound screening for AAA: pt doesn't qualify.  A written plan of action regarding the above screening and preventative services was given to the patient today.  DEXA was ordered and pt was given a written rx for zostavax to take to her pharmacy. She'll be arranging her own mammogram 10/2016.  An After Visit Summary was printed and given to the patient.  Signed:  Crissie Sickles, MD           06/19/2016

## 2016-06-19 NOTE — Progress Notes (Signed)
Pre visit review using our clinic review tool, if applicable. No additional management support is needed unless otherwise documented below in the visit note. 

## 2016-06-26 ENCOUNTER — Other Ambulatory Visit: Payer: Self-pay | Admitting: Family Medicine

## 2016-06-28 ENCOUNTER — Ambulatory Visit (INDEPENDENT_AMBULATORY_CARE_PROVIDER_SITE_OTHER)
Admission: RE | Admit: 2016-06-28 | Discharge: 2016-06-28 | Disposition: A | Discharge status: Home / Self Care | Payer: Commercial Managed Care - HMO | Source: Ambulatory Visit | Attending: Family Medicine | Admitting: Family Medicine

## 2016-06-28 DIAGNOSIS — E348 Other specified endocrine disorders: Secondary | ICD-10-CM | POA: Diagnosis not present

## 2016-06-30 HISTORY — PX: OTHER SURGICAL HISTORY: SHX169

## 2016-07-01 ENCOUNTER — Encounter: Payer: Self-pay | Admitting: Family Medicine

## 2016-07-31 ENCOUNTER — Ambulatory Visit (INDEPENDENT_AMBULATORY_CARE_PROVIDER_SITE_OTHER): Payer: Commercial Managed Care - HMO

## 2016-07-31 DIAGNOSIS — Z23 Encounter for immunization: Secondary | ICD-10-CM

## 2016-10-23 IMAGING — DX DG LUMBAR SPINE COMPLETE 4+V
5 series · 5 of 5 positions shown · non-contrast
Comparison: None available

CLINICAL DATA: Chronic low back pain for 1 month. Recent low back
strain.

EXAM:
LUMBAR SPINE - COMPLETE 4+ VIEW

[l-spine ap]
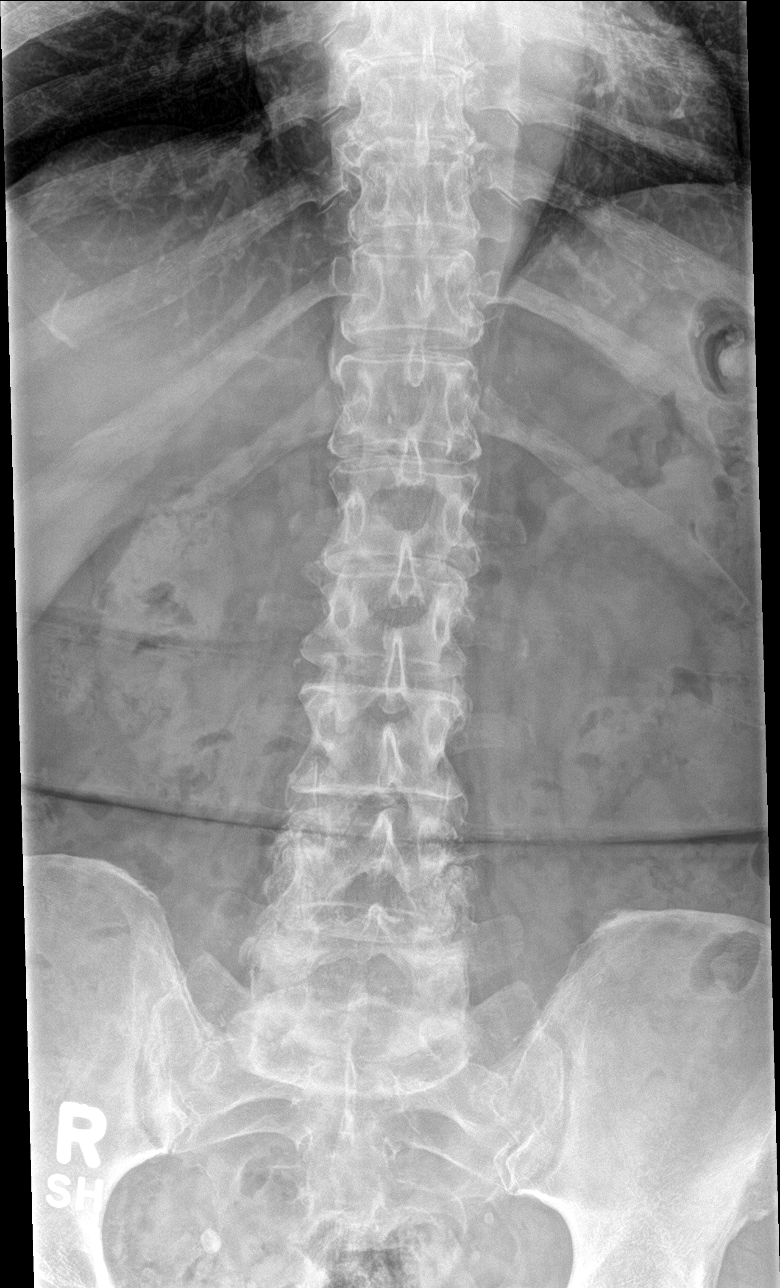

[l-spine obl (1 of 2)]
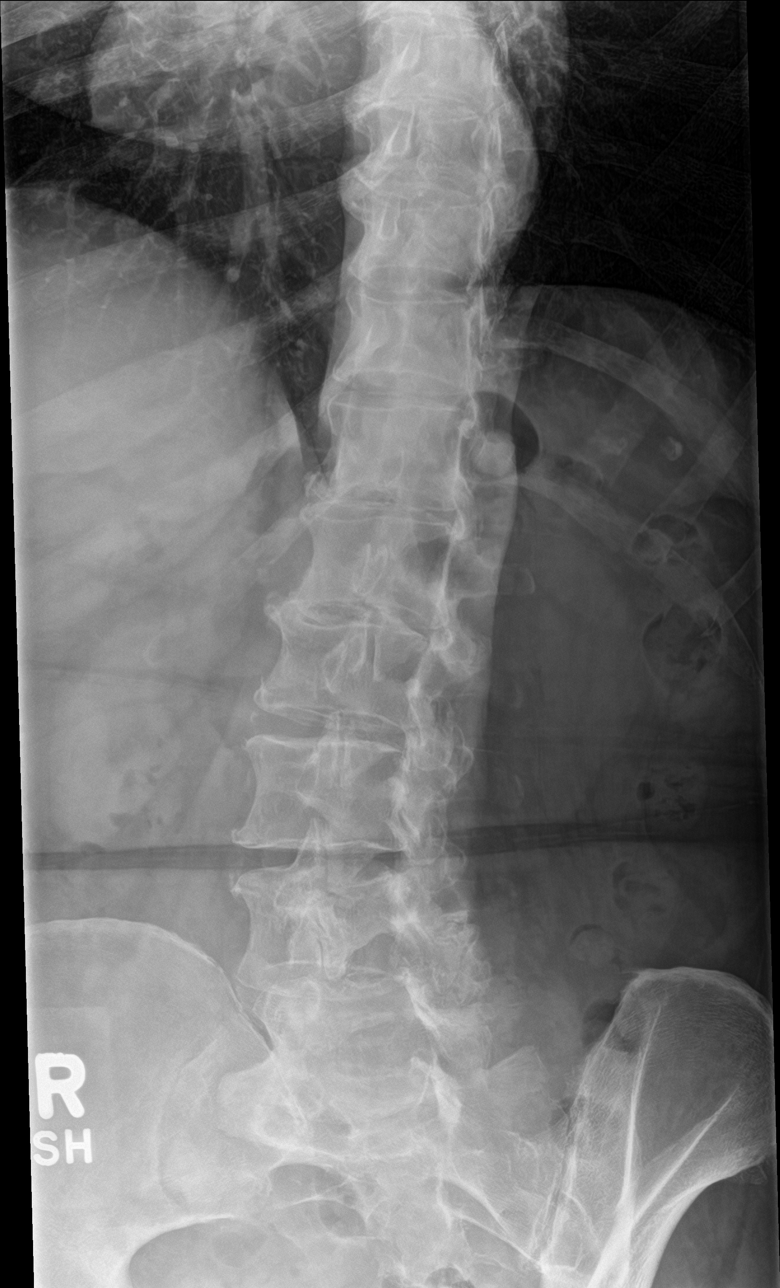

[l-spine obl (2 of 2)]
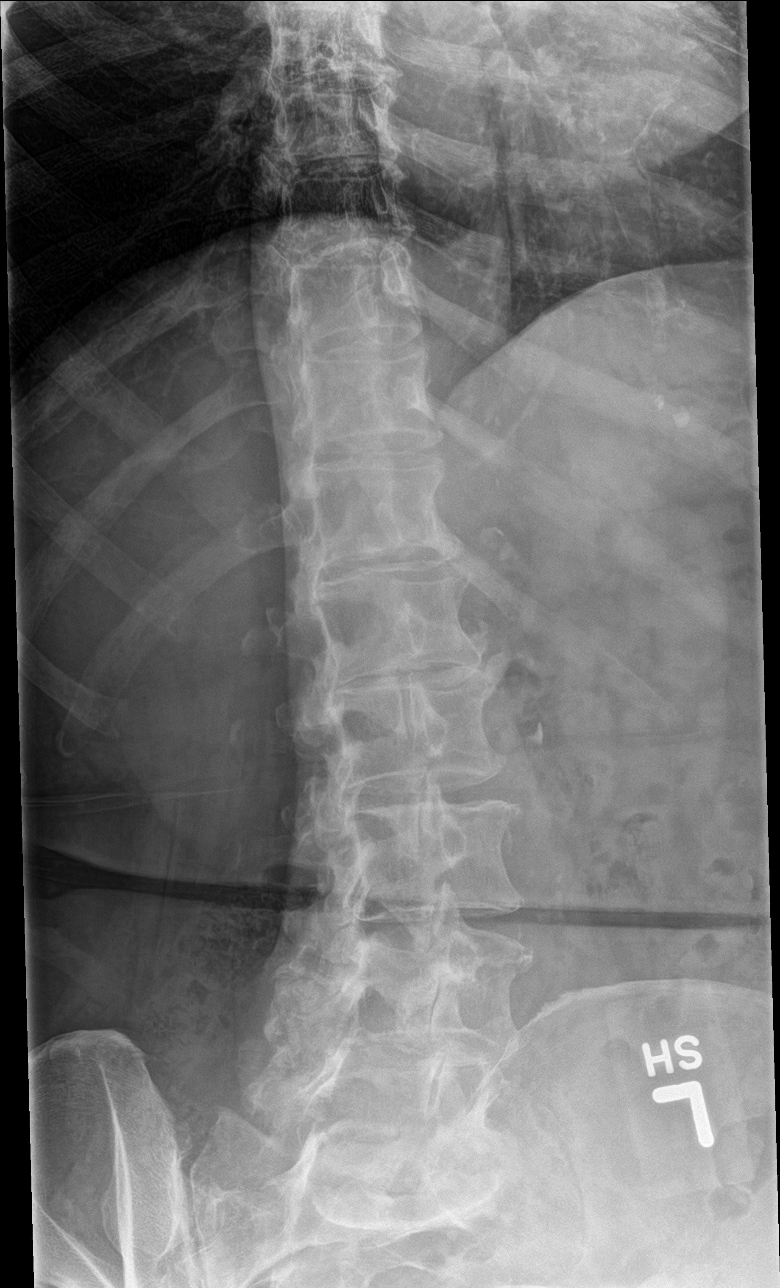

[l-spine lat]
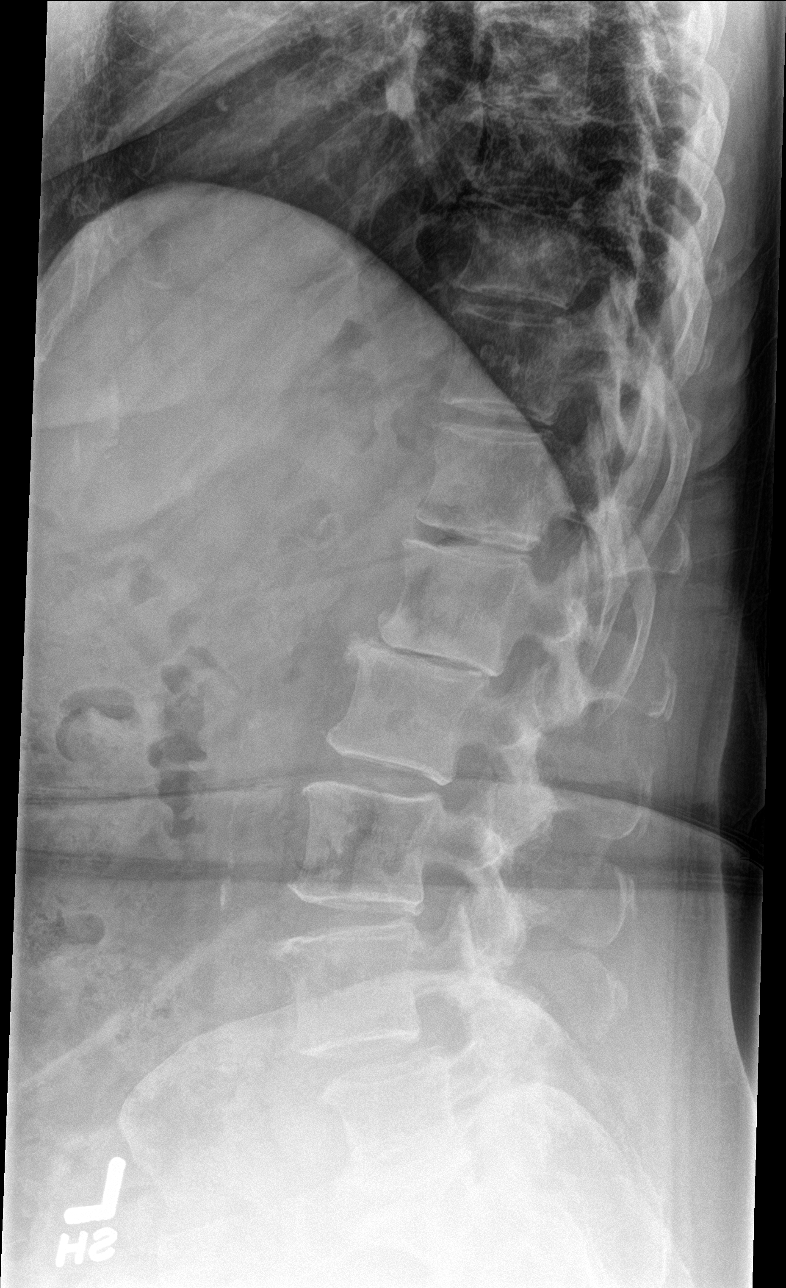

[l-spine spot]
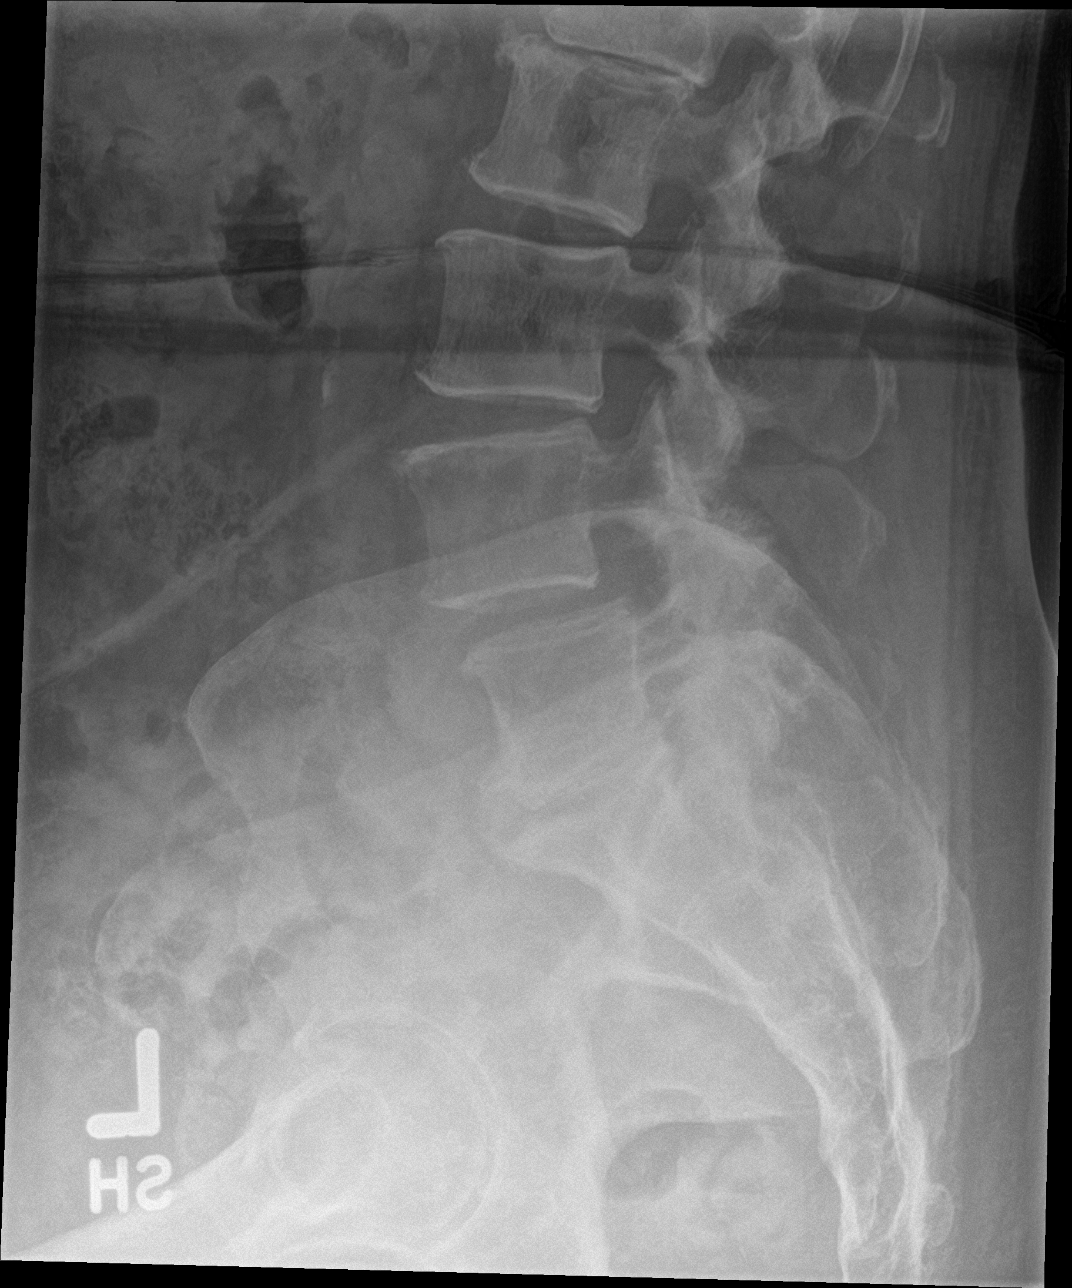

[5 of 5 positions shown; findings below may reference images not displayed]

FINDINGS: Diffuse multilevel lumbar degenerative changes and spondylosis at
all levels. Marked facet arthropathy at L4-5 and L5-S1. Facet
arthropathy results in grade 1 anterolisthesis of L4 upon L5
measuring 7 mm. There is an very minimal retrolisthesis of L1 upon
L2 measuring 3 mm secondary to degenerative disc disease and
spondylosis also. Preserved vertebral body heights. No acute
compression fracture, wedge-shaped deformity or focal kyphosis.
Facets appear aligned. No pars defects evident. Normal appearing
pedicles and SI joints for age. Normal bowel gas pattern. Abdominal
aortic atherosclerosis evident.
IMPRESSION: Moderately severe diffuse lumbar degenerative disc disease and
spondylosis as detailed above. No acute finding by plain
radiography.

Abdominal aortic atherosclerosis

## 2016-11-13 ENCOUNTER — Other Ambulatory Visit: Payer: Self-pay | Admitting: Family Medicine

## 2016-11-13 DIAGNOSIS — Z1231 Encounter for screening mammogram for malignant neoplasm of breast: Secondary | ICD-10-CM

## 2016-12-07 ENCOUNTER — Ambulatory Visit
Admission: RE | Admit: 2016-12-07 | Discharge: 2016-12-07 | Disposition: A | Discharge status: Home / Self Care | Payer: Medicare HMO | Source: Ambulatory Visit | Attending: Family Medicine | Admitting: Family Medicine

## 2016-12-07 DIAGNOSIS — Z1231 Encounter for screening mammogram for malignant neoplasm of breast: Secondary | ICD-10-CM

## 2016-12-17 ENCOUNTER — Encounter: Payer: Commercial Managed Care - HMO | Admitting: Family Medicine

## 2016-12-24 ENCOUNTER — Encounter: Payer: Self-pay | Admitting: Family Medicine

## 2016-12-24 ENCOUNTER — Ambulatory Visit (INDEPENDENT_AMBULATORY_CARE_PROVIDER_SITE_OTHER): Payer: Medicare HMO | Admitting: Family Medicine

## 2016-12-24 VITALS — BP 120/78 | HR 74 | Temp 97.7°F | Resp 16 | Ht 66.0 in | Wt 166.2 lb

## 2016-12-24 DIAGNOSIS — Z23 Encounter for immunization: Secondary | ICD-10-CM

## 2016-12-24 DIAGNOSIS — I10 Essential (primary) hypertension: Secondary | ICD-10-CM

## 2016-12-24 DIAGNOSIS — E7800 Pure hypercholesterolemia, unspecified: Secondary | ICD-10-CM | POA: Diagnosis not present

## 2016-12-24 DIAGNOSIS — E119 Type 2 diabetes mellitus without complications: Secondary | ICD-10-CM | POA: Diagnosis not present

## 2016-12-24 DIAGNOSIS — Z0000 Encounter for general adult medical examination without abnormal findings: Secondary | ICD-10-CM

## 2016-12-24 DIAGNOSIS — E039 Hypothyroidism, unspecified: Secondary | ICD-10-CM

## 2016-12-24 DIAGNOSIS — S51811A Laceration without foreign body of right forearm, initial encounter: Secondary | ICD-10-CM | POA: Diagnosis not present

## 2016-12-24 LAB — CBC WITH DIFFERENTIAL/PLATELET
BASOS ABS: 0 10*3/uL (ref 0.0–0.1)
Basophils Relative: 0.8 % (ref 0.0–3.0)
EOS ABS: 0.1 10*3/uL (ref 0.0–0.7)
EOS PCT: 1.8 % (ref 0.0–5.0)
HCT: 44 % (ref 36.0–46.0)
Hemoglobin: 14.7 g/dL (ref 12.0–15.0)
LYMPHS ABS: 2.1 10*3/uL (ref 0.7–4.0)
Lymphocytes Relative: 40.5 % (ref 12.0–46.0)
MCHC: 33.5 g/dL (ref 30.0–36.0)
MCV: 92.8 fl (ref 78.0–100.0)
MONO ABS: 0.5 10*3/uL (ref 0.1–1.0)
Monocytes Relative: 9.2 % (ref 3.0–12.0)
NEUTROS PCT: 47.7 % (ref 43.0–77.0)
Neutro Abs: 2.5 10*3/uL (ref 1.4–7.7)
Platelets: 208 10*3/uL (ref 150.0–400.0)
RBC: 4.74 Mil/uL (ref 3.87–5.11)
RDW: 12.6 % (ref 11.5–15.5)
WBC: 5.1 10*3/uL (ref 4.0–10.5)

## 2016-12-24 LAB — COMPREHENSIVE METABOLIC PANEL
ALT: 18 U/L (ref 0–35)
AST: 18 U/L (ref 0–37)
Albumin: 4 g/dL (ref 3.5–5.2)
Alkaline Phosphatase: 68 U/L (ref 39–117)
BILIRUBIN TOTAL: 0.8 mg/dL (ref 0.2–1.2)
BUN: 20 mg/dL (ref 6–23)
CO2: 33 meq/L — AB (ref 19–32)
Calcium: 9.3 mg/dL (ref 8.4–10.5)
Chloride: 102 mEq/L (ref 96–112)
Creatinine, Ser: 0.73 mg/dL (ref 0.40–1.20)
GFR: 81.4 mL/min (ref >60.00)
GLUCOSE: 113 mg/dL — AB (ref 70–99)
POTASSIUM: 4.1 meq/L (ref 3.5–5.1)
Sodium: 142 mEq/L (ref 135–145)
Total Protein: 6.5 g/dL (ref 6.0–8.3)

## 2016-12-24 LAB — LIPID PANEL
CHOL/HDL RATIO: 3
Cholesterol: 183 mg/dL (ref 0–200)
HDL: 58.9 mg/dL (ref >39.00)
LDL Cholesterol: 105 mg/dL — ABNORMAL HIGH (ref 0–99)
NONHDL: 123.61
Triglycerides: 94 mg/dL (ref 0.0–149.0)
VLDL: 18.8 mg/dL (ref 0.0–40.0)

## 2016-12-24 LAB — HEMOGLOBIN A1C: Hgb A1c MFr Bld: 7.1 % — ABNORMAL HIGH (ref 4.6–6.5)

## 2016-12-24 LAB — TSH: TSH: 0.53 u[IU]/mL (ref 0.35–4.50)

## 2016-12-24 NOTE — Progress Notes (Signed)
Office Note 12/24/2016  CC:  Chief Complaint  Patient presents with  . Annual Exam    Pt is fasting.     HPI:  Shannon Greene is a 83 y.o. White female who is here for annual health maintenance exam. Exercise: silver sneakers in the past but not lately due to husband's illness recently.  Eye exam: UTD, also has f/u next week. Dental: preventatives q6 mo. Diet: tries to eat sensibly, watch portion size.  She recently sustained a small skin tear on R forearm when she hit it against the metal door jab. No pain in the area.  No bleeding.  She is wearing a bandage over it.   Past Medical History:  Diagnosis Date  . Corneal dystrophies, hereditary   . Diabetes mellitus 10/2010   A1c 6.7%   . Diverticulosis   . Hyperlipemia   . Hypertension   . Hypothyroidism   . Lumbar spondylosis 05/2016   x-ray  . Osteoarthritis   . Transfusion history 1959   post partum    Past Surgical History:  Procedure Laterality Date  . APPENDECTOMY  1959  . CATARACT EXTRACTION, BILATERAL     Dr Bing Plume  . CESAREAN SECTION  1957; 89  . COLECTOMY     18 inches removed 2002-2003 for Diverticulitis  . COLONOSCOPY     Tics,Dr Sam [place]--pt declines any further screening as of 12/2015.  Marland Kitchen DEXA  06/30/2016   T score 2.5.  Repeat 3 yrs.  Marland Kitchen VAGINAL HYSTERECTOMY  1975   For Fibroids (no BSO)    Family History  Problem Relation Age of Onset  . Heart attack Mother 37  . Hypertension Mother   . Tuberculosis Father   . Diabetes Brother   . Stroke Brother 40  . Prostate cancer Brother     Social History   Social History  . Marital status: Married    Spouse name: N/A  . Number of children: N/A  . Years of education: N/A   Occupational History  . Not on file.   Social History Main Topics  . Smoking status: Never Smoker  . Smokeless tobacco: Never Used  . Alcohol use No  . Drug use: No  . Sexual activity: Not on file   Other Topics Concern  . Not on file   Social History  Narrative   Married, 2 daughters.   Occupation: retired Facilities manager).   Silver sneakers.   No T/A/Ds.    Outpatient Medications Prior to Visit  Medication Sig Dispense Refill  . aspirin 81 MG tablet Take 81 mg by mouth daily.     Marland Kitchen atorvastatin (LIPITOR) 20 MG tablet TAKE 1 TABLET EVERY DAY 90 tablet 3  . cycloSPORINE (RESTASIS) 0.05 % ophthalmic emulsion Place 1 drop into both eyes 2 (two) times daily.      . hydrochlorothiazide (HYDRODIURIL) 12.5 MG tablet TAKE 1 TABLET EVERY DAY IN PLACE OF  METHYCLOTHIAZIDE 90 tablet 1  . levothyroxine (SYNTHROID, LEVOTHROID) 50 MCG tablet TAKE 1 TABLET EVERY DAY  EXCEPT TAKE 1/2 TABLET ON TUESDAY AND SATURDAY 78 tablet 3  . ramipril (ALTACE) 5 MG capsule TAKE 1 CAPSULE EVERY DAY 90 capsule 1   No facility-administered medications prior to visit.     Allergies  Allergen Reactions  . Doxycycline Hyclate Diarrhea  . Penicillins     Rash Because of a history of documented adverse serious drug reaction;Medi Alert bracelet  is recommended  . Tetanus Toxoid     REACTION: Arm swelling-severe  Because of a history of documented adverse serious drug reaction;Medi Alert bracelet  is recommended  . Metformin And Related Nausea And Vomiting and Other (See Comments)    Abdominal pain    ROS Review of Systems  Constitutional: Negative for appetite change, chills, fatigue and fever.  HENT: Negative for congestion, dental problem, ear pain and sore throat.   Eyes: Negative for discharge, redness and visual disturbance.  Respiratory: Negative for cough, chest tightness, shortness of breath and wheezing.   Cardiovascular: Negative for chest pain, palpitations and leg swelling.  Gastrointestinal: Negative for abdominal pain, blood in stool, diarrhea, nausea and vomiting.  Genitourinary: Negative for difficulty urinating, dysuria, flank pain, frequency, hematuria and urgency.  Musculoskeletal: Negative for arthralgias, back pain, joint swelling,  myalgias and neck stiffness.  Skin: Negative for pallor and rash.       Skin tear--see HPI  Neurological: Negative for dizziness, speech difficulty, weakness and headaches.  Hematological: Negative for adenopathy. Does not bruise/bleed easily.  Psychiatric/Behavioral: Negative for confusion and sleep disturbance. The patient is not nervous/anxious.     PE; Blood pressure 120/78, pulse 74, temperature 97.7 F (36.5 C), temperature source Oral, resp. rate 16, height 5\' 6"  (1.676 m), weight 166 lb 4 oz (75.4 kg), SpO2 96 %. Gen: Alert, well appearing.  Patient is oriented to person, place, time, and situation. AFFECT: pleasant, lucid thought and speech. ENT: Ears: EACs clear, normal epithelium.  TMs with good light reflex and landmarks bilaterally.  Eyes: no injection, icteris, swelling, or exudate.  EOMI, PERRLA. Nose: no drainage or turbinate edema/swelling.  No injection or focal lesion.  Mouth: lips without lesion/swelling.  Oral mucosa pink and moist.  Dentition intact and without obvious caries or gingival swelling.  Oropharynx without erythema, exudate, or swelling.  Neck: supple/nontender.  No LAD, mass, or TM.  Carotid pulses 2+ bilaterally, without bruits. CV: RRR, no m/r/g.   LUNGS: CTA bilat, nonlabored resps, good aeration in all lung fields. ABD: soft, NT, ND, BS normal.  No hepatospenomegaly or mass.  No bruits. EXT: no clubbing, cyanosis, or edema.  R forearm with 2 cm superficial skin tear, no active bleeding.  Slight bruising around this.  No erythema or tenderness.  No streaking.  Musculoskeletal: no joint swelling, erythema, warmth, or tenderness.  ROM of all joints intact. Skin - no sores or suspicious lesions or rashes or color changes  Pertinent labs:  Lab Results  Component Value Date   TSH 0.31 (L) 12/16/2015   Lab Results  Component Value Date   WBC 6.0 12/16/2015   HGB 14.8 12/16/2015   HCT 48.8 (H) 12/16/2015   MCV 99.0 12/16/2015   PLT 242.0 12/16/2015    Lab Results  Component Value Date   CREATININE 0.77 12/16/2015   BUN 19 12/16/2015   NA 142 12/16/2015   K 4.6 12/16/2015   CL 101 12/16/2015   CO2 35 (H) 12/16/2015   Lab Results  Component Value Date   ALT 17 12/16/2015   AST 17 12/16/2015   ALKPHOS 80 12/16/2015   BILITOT 0.8 12/16/2015   Lab Results  Component Value Date   CHOL 177 06/19/2016   Lab Results  Component Value Date   HDL 62.00 06/19/2016   Lab Results  Component Value Date   LDLCALC 95 06/19/2016   Lab Results  Component Value Date   TRIG 98.0 06/19/2016   Lab Results  Component Value Date   CHOLHDL 3 06/19/2016   Lab Results  Component Value Date  HGBA1C 6.5 04/19/2016    ASSESSMENT AND PLAN:   Health maintenance exam: Reviewed age and gender appropriate health maintenance issues (prudent diet, regular exercise, health risks of tobacco and excessive alcohol, use of seatbelts, fire alarms in home, use of sunscreen).  Also reviewed age and gender appropriate health screening as well as vaccine recommendations. Td booster today (she cannot recall when she had her most recent one) for her recent skin tear. Colon ca screening: pt declines any further colon cancer screening and I agree with this. Cervical ca screening: not a candidate due to history of hysterectomy for benign reason. Breast cancer screening: UTD--most recent mammogram normal 12/07/16. DEXA UTD--repeat 2020. DM 2: eye exam UTD.  Check HbA1c today.  An After Visit Summary was printed and given to the patient.  FOLLOW UP:  Return in about 3 months (around 03/23/2017) for routine chronic illness f/u.  Signed:  Crissie Sickles, MD           12/24/2016

## 2016-12-24 NOTE — Addendum Note (Signed)
Addended by: Onalee Hua on: 12/24/2016 10:54 AM   Modules accepted: Orders

## 2016-12-24 NOTE — Progress Notes (Signed)
Pre visit review using our clinic review tool, if applicable. No additional management support is needed unless otherwise documented below in the visit note. 

## 2017-01-02 ENCOUNTER — Other Ambulatory Visit: Payer: Self-pay | Admitting: Family Medicine

## 2017-01-02 MED ORDER — METFORMIN HCL 500 MG PO TABS: 500.0000 mg | ORAL_TABLET | Freq: Two times a day (BID) | ORAL | 3 refills | Status: DC

## 2017-01-03 DIAGNOSIS — H40013 Open angle with borderline findings, low risk, bilateral: Secondary | ICD-10-CM | POA: Diagnosis not present

## 2017-01-03 DIAGNOSIS — H353131 Nonexudative age-related macular degeneration, bilateral, early dry stage: Secondary | ICD-10-CM | POA: Diagnosis not present

## 2017-01-03 DIAGNOSIS — H04123 Dry eye syndrome of bilateral lacrimal glands: Secondary | ICD-10-CM | POA: Diagnosis not present

## 2017-01-03 DIAGNOSIS — H524 Presbyopia: Secondary | ICD-10-CM | POA: Diagnosis not present

## 2017-01-03 DIAGNOSIS — H1850 Unspecified hereditary corneal dystrophies: Secondary | ICD-10-CM | POA: Diagnosis not present

## 2017-02-06 ENCOUNTER — Telehealth: Payer: Self-pay | Admitting: Family Medicine

## 2017-02-06 MED ORDER — ATORVASTATIN CALCIUM 40 MG PO TABS: 40.0000 mg | ORAL_TABLET | Freq: Every day | ORAL | 3 refills | Status: DC

## 2017-02-06 NOTE — Telephone Encounter (Signed)
Patient is taking 2 atorvastatin per day since her dosage was increased to 40 MG. She is checking to see if an Rx was sent to [facility] mail order. Please call

## 2017-02-06 NOTE — Telephone Encounter (Signed)
Pt advised and voiced understanding.   

## 2017-02-06 NOTE — Telephone Encounter (Signed)
Rx for increase dose of atorvastatin sent to [facility] today.

## 2017-02-22 ENCOUNTER — Other Ambulatory Visit: Payer: Self-pay | Admitting: Family Medicine

## 2017-02-22 NOTE — Telephone Encounter (Signed)
[employer].  RF request for levothyroxine LOV: 12/24/16 Next ov: 03/22/17 Last written: 12/20/15 #78 w/ 3RF  12/24/16 TSH 0.53

## 2017-03-22 ENCOUNTER — Encounter: Payer: Self-pay | Admitting: Family Medicine

## 2017-03-22 ENCOUNTER — Ambulatory Visit (INDEPENDENT_AMBULATORY_CARE_PROVIDER_SITE_OTHER): Payer: Medicare HMO | Admitting: Family Medicine

## 2017-03-22 VITALS — BP 109/69 | HR 80 | Temp 97.9°F | Resp 16 | Ht 66.0 in | Wt 166.5 lb

## 2017-03-22 DIAGNOSIS — E7800 Pure hypercholesterolemia, unspecified: Secondary | ICD-10-CM | POA: Diagnosis not present

## 2017-03-22 DIAGNOSIS — I10 Essential (primary) hypertension: Secondary | ICD-10-CM

## 2017-03-22 DIAGNOSIS — E119 Type 2 diabetes mellitus without complications: Secondary | ICD-10-CM | POA: Diagnosis not present

## 2017-03-22 NOTE — Progress Notes (Signed)
OFFICE VISIT  03/22/2017   CC:  Chief Complaint  Patient presents with  . Follow-up    [name], pt is not fasting.    HPI:    Patient is a 83 y.o. Caucasian female who presents for f/u DM 2, hyperlipidemia, HTN. At last visit 12/2016, I recommended she start metformin 500 mg bid and also increase her atorvastatin to 40mg  qd. I forgot that she does not tolerate metformin so she did not get on this med.  She did increase her atorva to 40mg  qd.  DM: she has continued her diabetic diet. She does work around [employer], uses Chiropractor. No home glucose monitoring.  HTN: she does not monitor bp at home b/c she used to check it regularly and it was always normal.  She increased atorva to 40mg  qd as directed.  Is tolerating this well. She is not fasting today.  Past Medical History:  Diagnosis Date  . Corneal dystrophies, hereditary   . Diabetes mellitus 10/2010   A1c 6.7%   . Diverticulosis   . Hyperlipemia   . Hypertension   . Hypothyroidism   . Lumbar spondylosis 05/2016   x-ray  . Osteoarthritis   . Transfusion history 1959   post partum    Past Surgical History:  Procedure Laterality Date  . APPENDECTOMY  1959  . CATARACT EXTRACTION, BILATERAL     Dr Bing Plume  . CESAREAN SECTION  1957; 89  . COLECTOMY     18 inches removed 2002-2003 for Diverticulitis  . COLONOSCOPY     Tics,Dr Sam [employer]--pt declines any further screening as of 12/2015.  Marland Kitchen DEXA  06/30/2016   T score 2.5.  Repeat 3 yrs.  Marland Kitchen VAGINAL HYSTERECTOMY  1975   For Fibroids (no BSO)    Outpatient Medications Prior to Visit  Medication Sig Dispense Refill  . aspirin 81 MG tablet Take 81 mg by mouth daily.     Marland Kitchen atorvastatin (LIPITOR) 40 MG tablet Take 1 tablet (40 mg total) by mouth daily. 90 tablet 3  . cycloSPORINE (RESTASIS) 0.05 % ophthalmic emulsion Place 1 drop into both eyes 2 (two) times daily.      . hydrochlorothiazide (HYDRODIURIL) 12.5 MG tablet TAKE 1 TABLET EVERY DAY IN PLACE OF  METHYCLOTHIAZIDE  90 tablet 1  . levothyroxine (SYNTHROID, LEVOTHROID) 50 MCG tablet TAKE 1 TABLET EVERY DAY  EXCEPT TAKE 1/2 TABLET ON TUESDAY AND SATURDAY 78 tablet 3  . ramipril (ALTACE) 5 MG capsule TAKE 1 CAPSULE EVERY DAY 90 capsule 1  . metFORMIN (GLUCOPHAGE) 500 MG tablet Take 1 tablet (500 mg total) by mouth 2 (two) times daily with a meal. (Patient not taking: Reported on 03/22/2017) 180 tablet 3   No facility-administered medications prior to visit.     Allergies  Allergen Reactions  . Doxycycline Hyclate Diarrhea  . Penicillins     Rash Because of a history of documented adverse serious drug reaction;Medi Alert bracelet  is recommended  . Tetanus Toxoid     REACTION: Arm swelling-severe Because of a history of documented adverse serious drug reaction;Medi Alert bracelet  is recommended  . Metformin And Related Nausea And Vomiting and Other (See Comments)    Abdominal pain    ROS As per HPI  PE: Blood pressure 109/69, pulse 80, temperature 97.9 F (36.6 C), temperature source Oral, resp. rate 16, height 5\' 6"  (1.676 m), weight 166 lb 8 oz (75.5 kg), SpO2 94 %. Gen: Alert, well appearing.  Patient is oriented to person,  place, time, and situation. AFFECT: pleasant, lucid thought and speech. VQQ:VZDG: no injection, icteris, swelling, or exudate.  EOMI, PERRLA. Mouth: lips without lesion/swelling.  Oral mucosa pink and moist. Oropharynx without erythema, exudate, or swelling.  CV: RRR, no m/r/g.   LUNGS: CTA bilat, nonlabored resps, good aeration in all lung fields. EXT: no clubbing, cyanosis, or edema.    LABS:  Lab Results  Component Value Date   TSH 0.53 12/24/2016   Lab Results  Component Value Date   WBC 5.1 12/24/2016   HGB 14.7 12/24/2016   HCT 44.0 12/24/2016   MCV 92.8 12/24/2016   PLT 208.0 12/24/2016   Lab Results  Component Value Date   CREATININE 0.73 12/24/2016   BUN 20 12/24/2016   NA 142 12/24/2016   K 4.1 12/24/2016   CL 102 12/24/2016   CO2 33 (H)  12/24/2016   Lab Results  Component Value Date   ALT 18 12/24/2016   AST 18 12/24/2016   ALKPHOS 68 12/24/2016   BILITOT 0.8 12/24/2016   Lab Results  Component Value Date   CHOL 183 12/24/2016   Lab Results  Component Value Date   HDL 58.90 12/24/2016   Lab Results  Component Value Date   LDLCALC 105 (H) 12/24/2016   Lab Results  Component Value Date   TRIG 94.0 12/24/2016   Lab Results  Component Value Date   CHOLHDL 3 12/24/2016   Lab Results  Component Value Date   HGBA1C 7.1 (H) 12/24/2016   IMPRESSION AND PLAN:  1) DM 2, diet controlled. Hx of GI intolerance to metformin. Recheck HbA1c with future fasting labs--next week. She is due for her eye exam.  2) HTN; The current medical regimen is effective;  continue present plan and medications. Lytes/cr with upcoming fasting labs.  3) Hyperlipidemia: increased atorva to 40mg  qd 6 mo ago and she is tolerating this well.  AST/ALT normal 12/2016. Recheck FLP next week when fasting.  An After Visit Summary was printed and given to the patient.  FOLLOW UP: Return in about 3 months (around 06/22/2017) for routine chronic illness f/u--also make lab appt at your earliest convenience (fasting).  Signed:  Crissie Sickles, MD           03/22/2017

## 2017-03-26 ENCOUNTER — Other Ambulatory Visit (INDEPENDENT_AMBULATORY_CARE_PROVIDER_SITE_OTHER): Payer: Medicare HMO

## 2017-03-26 DIAGNOSIS — I10 Essential (primary) hypertension: Secondary | ICD-10-CM

## 2017-03-26 DIAGNOSIS — E119 Type 2 diabetes mellitus without complications: Secondary | ICD-10-CM

## 2017-03-26 DIAGNOSIS — E7800 Pure hypercholesterolemia, unspecified: Secondary | ICD-10-CM | POA: Diagnosis not present

## 2017-03-26 LAB — BASIC METABOLIC PANEL
BUN: 22 mg/dL (ref 6–23)
CHLORIDE: 102 meq/L (ref 96–112)
CO2: 35 meq/L — AB (ref 19–32)
Calcium: 9.3 mg/dL (ref 8.4–10.5)
Creatinine, Ser: 0.77 mg/dL (ref 0.40–1.20)
GFR: 76.49 mL/min (ref >60.00)
Glucose, Bld: 126 mg/dL — ABNORMAL HIGH (ref 70–99)
POTASSIUM: 4.3 meq/L (ref 3.5–5.1)
SODIUM: 143 meq/L (ref 135–145)

## 2017-03-26 LAB — LIPID PANEL
Cholesterol: 155 mg/dL (ref 0–200)
HDL: 55.6 mg/dL (ref >39.00)
LDL Cholesterol: 81 mg/dL (ref 0–99)
NONHDL: 99.13
Total CHOL/HDL Ratio: 3
Triglycerides: 92 mg/dL (ref 0.0–149.0)
VLDL: 18.4 mg/dL (ref 0.0–40.0)

## 2017-03-26 LAB — HEMOGLOBIN A1C: HEMOGLOBIN A1C: 7.2 % — AB (ref 4.6–6.5)

## 2017-04-18 ENCOUNTER — Other Ambulatory Visit: Payer: Self-pay | Admitting: Family Medicine

## 2017-06-21 NOTE — Progress Notes (Signed)
Subjective:   Shannon Greene is a 83 y.o. female who presents for Medicare Annual (Subsequent) preventive examination.  Review of Systems:  No ROS.  Medicare Wellness Visit. Additional risk factors are reflected in the social history.  Cardiac Risk Factors include: dyslipidemia;advanced age (>67men, >54 women);hypertension;family history of premature cardiovascular disease   Sleep patterns: sleeps 7-8 hours, feels rested.  Home Safety/Smoke Alarms: Feels safe in home. Smoke alarms in place.  Living environment; residence and Firearm Safety: Lives with husband in 1 story home.  Seat Belt Safety/Bike Helmet: Wears seat belt.   Female:   Pap-N/A     Mammo-12/07/16, Negative.  Dexa scan-06/28/2016, normal.          CCS-Colonoscopy > 10 years.       Objective:     Vitals: BP (!) 126/58 (BP Location: Left Arm, Patient Position: Sitting, Cuff Size: Normal)   Pulse 81   Resp 18   Ht 5\' 6"  (1.676 m)   Wt 168 lb (76.2 kg)   SpO2 95%   BMI 27.12 kg/m   Body mass index is 27.12 kg/m.   Tobacco History  Smoking Status  . Never Smoker  Smokeless Tobacco  . Never Used     Counseling given: Not Answered   Past Medical History:  Diagnosis Date  . Corneal dystrophies, hereditary   . Diabetes mellitus 10/2010   A1c 6.7%   . Diverticulosis   . Hyperlipemia   . Hypertension   . Hypothyroidism   . Lumbar spondylosis 05/2016   x-ray  . Osteoarthritis   . Transfusion history 1959   post partum   Past Surgical History:  Procedure Laterality Date  . APPENDECTOMY  1959  . CATARACT EXTRACTION, BILATERAL     Dr Bing Plume  . CESAREAN SECTION  1957; 2  . COLECTOMY     18 inches removed 2002-2003 for Diverticulitis  . COLONOSCOPY     Tics,Dr Sam [place]--pt declines any further screening as of 12/2015.  Marland Kitchen DEXA  06/30/2016   T score 2.5.  Repeat 3 yrs.  Marland Kitchen VAGINAL HYSTERECTOMY  1975   For Fibroids (no BSO)   Family History  Problem Relation Age of Onset  . Heart  attack Mother 81  . Hypertension Mother   . Tuberculosis Father   . Diabetes Brother   . Stroke Brother 40  . Prostate cancer Brother    History  Sexual Activity  . Sexual activity: Not on file    Outpatient Encounter Prescriptions as of 06/24/2017  Medication Sig  . aspirin 81 MG tablet Take 81 mg by mouth daily.   Marland Kitchen atorvastatin (LIPITOR) 40 MG tablet Take 1 tablet (40 mg total) by mouth daily.  . cycloSPORINE (RESTASIS) 0.05 % ophthalmic emulsion Place 1 drop into both eyes 2 (two) times daily.    . hydrochlorothiazide (HYDRODIURIL) 12.5 MG tablet TAKE 1 TABLET EVERY DAY IN PLACE OF METHYCLOTHIAZIDE   . levothyroxine (SYNTHROID, LEVOTHROID) 50 MCG tablet TAKE 1 TABLET EVERY DAY  EXCEPT TAKE 1/2 TABLET ON TUESDAY AND SATURDAY  . ramipril (ALTACE) 5 MG capsule TAKE 1 CAPSULE EVERY DAY  . SHINGRIX injection    No facility-administered encounter medications on file as of 06/24/2017.     Activities of Daily Living In your present state of health, do you have any difficulty performing the following activities: 06/24/2017  Hearing? N  Vision? N  Difficulty concentrating or making decisions? N  Walking or climbing stairs? N  Dressing or bathing? N  Doing  errands, shopping? N  Preparing Food and eating ? N  Using the Toilet? N  In the past six months, have you accidently leaked urine? N  Do you have problems with loss of bowel control? N  Managing your Medications? N  Managing your Finances? N  Housekeeping or managing your Housekeeping? N  Some recent data might be hidden    Patient Care Team: Tammi Sou, MD as PCP - General (Family Medicine) Calvert Cantor, MD as Consulting Physician (Ophthalmology)    Assessment:    Physical assessment deferred to PCP.  Exercise Activities and Dietary recommendations Current Exercise Habits: The patient does not participate in regular exercise at present (Maintaining household), Exercise limited by: None identified   Diet (meal  preparation, eat out, water intake, caffeinated beverages, dairy products, fruits and vegetables): Drinks tea and gatorade.   Breakfast: bagel, tea Lunch: tomato sandwich Dinner: protein and vegetables.    Goals      Patient Stated   . <enter goal here> (pt-stated)          Maintain current health.       Fall Risk Fall Risk  06/24/2017 06/19/2016 12/16/2015 12/09/2014 12/07/2013  Falls in the past year? No No No No No   Depression Screen PHQ 2/9 Scores 06/24/2017 06/19/2016 12/16/2015 12/09/2014  PHQ - 2 Score 0 0 0 0  Exception Documentation - - - Patient refusal     Cognitive Function       Ad8 score reviewed for issues:  Issues making decisions: no  Less interest in hobbies / activities: no  Repeats questions, stories (family complaining): no  Trouble using ordinary gadgets (microwave, computer, phone): no  Forgets the month or year: no  Mismanaging finances: no  Remembering appts: no  Daily problems with thinking and/or memory: no Ad8 score is=0     Immunization History  Administered Date(s) Administered  . Influenza Whole 07/06/2012  . Influenza,inj,Quad PF,36+ Mos 07/31/2016  . Pneumococcal Conjugate-13 12/16/2015  . Pneumococcal-Unspecified 11/05/2001  . Td 12/24/2016  . Zoster 06/21/2016  . Zoster Recombinat (Shingrix) 03/29/2017   Screening Tests Health Maintenance  Topic Date Due  . OPHTHALMOLOGY EXAM  06/27/2016  . INFLUENZA VACCINE  06/05/2017  . HEMOGLOBIN A1C  09/26/2017  . FOOT EXAM  06/24/2018  . DEXA SCAN  06/29/2019  . PNA vac Low Risk Adult  Completed   Diabetic Foot Exam - Simple   Simple Foot Form Diabetic Foot exam was performed with the following findings:  Yes 06/24/2017 10:21 AM  Visual Inspection No deformities, no ulcerations, no other skin breakdown bilaterally:  Yes Sensation Testing Intact to touch and monofilament testing bilaterally:  Yes Pulse Check Posterior Tibialis and Dorsalis pulse intact bilaterally:   Yes Comments        Plan:    Continue doing brain stimulating activities (puzzles, reading, adult coloring books, staying active) to keep memory sharp.   Bring a copy of your living will and/or healthcare power of attorney to your next office visit.   I have personally reviewed and noted the following in the patient's chart:   . Medical and social history . Use of alcohol, tobacco or illicit drugs  . Current medications and supplements . Functional ability and status . Nutritional status . Physical activity . Advanced directives . List of other physicians . Hospitalizations, surgeries, and ER visits in previous 12 months . Vitals . Screenings to include cognitive, depression, and falls . Referrals and appointments  In addition, I have reviewed  and discussed with patient certain preventive protocols, quality metrics, and best practice recommendations. A written personalized care plan for preventive services as well as general preventive health recommendations were provided to patient.     Gerilyn Nestle, RN  06/24/2017

## 2017-06-24 ENCOUNTER — Ambulatory Visit (INDEPENDENT_AMBULATORY_CARE_PROVIDER_SITE_OTHER): Payer: Medicare HMO | Admitting: Family Medicine

## 2017-06-24 ENCOUNTER — Encounter: Payer: Self-pay | Admitting: Family Medicine

## 2017-06-24 ENCOUNTER — Ambulatory Visit: Payer: Medicare HMO

## 2017-06-24 VITALS — BP 126/58 | HR 81 | Temp 98.2°F | Resp 18 | Ht 66.0 in | Wt 168.0 lb

## 2017-06-24 DIAGNOSIS — I10 Essential (primary) hypertension: Secondary | ICD-10-CM

## 2017-06-24 DIAGNOSIS — Z0000 Encounter for general adult medical examination without abnormal findings: Secondary | ICD-10-CM | POA: Diagnosis not present

## 2017-06-24 DIAGNOSIS — L989 Disorder of the skin and subcutaneous tissue, unspecified: Secondary | ICD-10-CM

## 2017-06-24 DIAGNOSIS — E119 Type 2 diabetes mellitus without complications: Secondary | ICD-10-CM | POA: Diagnosis not present

## 2017-06-24 DIAGNOSIS — E7800 Pure hypercholesterolemia, unspecified: Secondary | ICD-10-CM | POA: Diagnosis not present

## 2017-06-24 LAB — POCT GLYCOSYLATED HEMOGLOBIN (HGB A1C): Hemoglobin A1C: 6.8

## 2017-06-24 NOTE — Patient Instructions (Signed)
Continue doing brain stimulating activities (puzzles, reading, adult coloring books, staying active) to keep memory sharp.   Bring a copy of your living will and/or healthcare power of attorney to your next office visit.   Health Maintenance, Female Adopting a healthy lifestyle and getting preventive care can go a long way to promote health and wellness. Talk with your health care provider about what schedule of regular examinations is right for you. This is a good chance for you to check in with your provider about disease prevention and staying healthy. In between checkups, there are plenty of things you can do on your own. Experts have done a lot of research about which lifestyle changes and preventive measures are most likely to keep you healthy. Ask your health care provider for more information. Weight and diet Eat a healthy diet  Be sure to include plenty of vegetables, fruits, low-fat dairy products, and lean protein.  Do not eat a lot of foods high in solid fats, added sugars, or salt.  Get regular exercise. This is one of the most important things you can do for your health. ? Most adults should exercise for at least 150 minutes each week. The exercise should increase your heart rate and make you sweat (moderate-intensity exercise). ? Most adults should also do strengthening exercises at least twice a week. This is in addition to the moderate-intensity exercise.  Maintain a healthy weight  Body mass index (BMI) is a measurement that can be used to identify possible weight problems. It estimates body fat based on height and weight. Your health care provider can help determine your BMI and help you achieve or maintain a healthy weight.  For females 20 years of age and older: ? A BMI below 18.5 is considered underweight. ? A BMI of 18.5 to 24.9 is normal. ? A BMI of 25 to 29.9 is considered overweight. ? A BMI of 30 and above is considered obese.  Watch levels of cholesterol and  blood lipids  You should start having your blood tested for lipids and cholesterol at 81 years of age, then have this test every 5 years.  You may need to have your cholesterol levels checked more often if: ? Your lipid or cholesterol levels are high. ? You are older than 81 years of age. ? You are at high risk for heart disease.  Cancer screening Lung Cancer  Lung cancer screening is recommended for adults 55-80 years old who are at high risk for lung cancer because of a history of smoking.  A yearly low-dose CT scan of the lungs is recommended for people who: ? Currently smoke. ? Have quit within the past 15 years. ? Have at least a 30-pack-year history of smoking. A pack year is smoking an average of one pack of cigarettes a day for 1 year.  Yearly screening should continue until it has been 15 years since you quit.  Yearly screening should stop if you develop a health problem that would prevent you from having lung cancer treatment.  Breast Cancer  Practice breast self-awareness. This means understanding how your breasts normally appear and feel.  It also means doing regular breast self-exams. Let your health care provider know about any changes, no matter how small.  If you are in your 20s or 30s, you should have a clinical breast exam (CBE) by a health care provider every 1-3 years as part of a regular health exam.  If you are 40 or older, have a CBE   year. Also consider having a breast X-ray (mammogram) every year.  If you have a family history of breast cancer, talk to your health care provider about genetic screening.  If you are at high risk for breast cancer, talk to your health care provider about having an MRI and a mammogram every year.  Breast cancer gene (BRCA) assessment is recommended for women who have family members with BRCA-related cancers. BRCA-related cancers include: ? Breast. ? Ovarian. ? Tubal. ? Peritoneal cancers.  Results of the assessment  will determine the need for genetic counseling and BRCA1 and BRCA2 testing.  Cervical Cancer Your health care provider may recommend that you be screened regularly for cancer of the pelvic organs (ovaries, uterus, and vagina). This screening involves a pelvic examination, including checking for microscopic changes to the surface of your cervix (Pap test). You may be encouraged to have this screening done every 3 years, beginning at age 67.  For women ages 29-65, health care providers may recommend pelvic exams and Pap testing every 3 years, or they may recommend the Pap and pelvic exam, combined with testing for human papilloma virus (HPV), every 5 years. Some types of HPV increase your risk of cervical cancer. Testing for HPV may also be done on women of any age with unclear Pap test results.  Other health care providers may not recommend any screening for nonpregnant women who are considered low risk for pelvic cancer and who do not have symptoms. Ask your health care provider if a screening pelvic exam is right for you.  If you have had past treatment for cervical cancer or a condition that could lead to cancer, you need Pap tests and screening for cancer for at least 20 years after your treatment. If Pap tests have been discontinued, your risk factors (such as having a new sexual partner) need to be reassessed to determine if screening should resume. Some women have medical problems that increase the chance of getting cervical cancer. In these cases, your health care provider may recommend more frequent screening and Pap tests.  Colorectal Cancer  This type of cancer can be detected and often prevented.  Routine colorectal cancer screening usually begins at 81 years of age and continues through 81 years of age.  Your health care provider may recommend screening at an earlier age if you have risk factors for colon cancer.  Your health care provider may also recommend using home test kits to  check for hidden blood in the stool.  A small camera at the end of a tube can be used to examine your colon directly (sigmoidoscopy or colonoscopy). This is done to check for the earliest forms of colorectal cancer.  Routine screening usually begins at age 45.  Direct examination of the colon should be repeated every 5-10 years through 81 years of age. However, you may need to be screened more often if early forms of precancerous polyps or small growths are found.  Skin Cancer  Check your skin from head to toe regularly.  Tell your health care provider about any new moles or changes in moles, especially if there is a change in a mole's shape or color.  Also tell your health care provider if you have a mole that is larger than the size of a pencil eraser.  Always use sunscreen. Apply sunscreen liberally and repeatedly throughout the day.  Protect yourself by wearing long sleeves, pants, a wide-brimmed hat, and sunglasses whenever you are outside.  Heart disease, diabetes, and  high blood pressure  High blood pressure causes heart disease and increases the risk of stroke. High blood pressure is more likely to develop in: ? People who have blood pressure in the high end of the normal range (130-139/85-89 mm Hg). ? People who are overweight or obese. ? People who are African American.  If you are 18-39 years of age, have your blood pressure checked every 3-5 years. If you are 40 years of age or older, have your blood pressure checked every year. You should have your blood pressure measured twice-once when you are at a hospital or clinic, and once when you are not at a hospital or clinic. Record the average of the two measurements. To check your blood pressure when you are not at a hospital or clinic, you can use: ? An automated blood pressure machine at a pharmacy. ? A home blood pressure monitor.  If you are between 55 years and 79 years old, ask your health care provider if you should  take aspirin to prevent strokes.  Have regular diabetes screenings. This involves taking a blood sample to check your fasting blood sugar level. ? If you are at a normal weight and have a low risk for diabetes, have this test once every three years after 81 years of age. ? If you are overweight and have a high risk for diabetes, consider being tested at a younger age or more often. Preventing infection Hepatitis B  If you have a higher risk for hepatitis B, you should be screened for this virus. You are considered at high risk for hepatitis B if: ? You were born in a country where hepatitis B is common. Ask your health care provider which countries are considered high risk. ? Your parents were born in a high-risk country, and you have not been immunized against hepatitis B (hepatitis B vaccine). ? You have HIV or AIDS. ? You use needles to inject street drugs. ? You live with someone who has hepatitis B. ? You have had sex with someone who has hepatitis B. ? You get hemodialysis treatment. ? You take certain medicines for conditions, including cancer, organ transplantation, and autoimmune conditions.  Hepatitis C  Blood testing is recommended for: ? Everyone born from 1945 through 1965. ? Anyone with known risk factors for hepatitis C.  Sexually transmitted infections (STIs)  You should be screened for sexually transmitted infections (STIs) including gonorrhea and chlamydia if: ? You are sexually active and are younger than 81 years of age. ? You are older than 81 years of age and your health care provider tells you that you are at risk for this type of infection. ? Your sexual activity has changed since you were last screened and you are at an increased risk for chlamydia or gonorrhea. Ask your health care provider if you are at risk.  If you do not have HIV, but are at risk, it may be recommended that you take a prescription medicine daily to prevent HIV infection. This is called  pre-exposure prophylaxis (PrEP). You are considered at risk if: ? You are sexually active and do not regularly use condoms or know the HIV status of your partner(s). ? You take drugs by injection. ? You are sexually active with a partner who has HIV.  Talk with your health care provider about whether you are at high risk of being infected with HIV. If you choose to begin PrEP, you should first be tested for HIV. You should then be   tested every 3 months for as long as you are taking PrEP. Pregnancy  If you are premenopausal and you may become pregnant, ask your health care provider about preconception counseling.  If you may become pregnant, take 400 to 800 micrograms (mcg) of folic acid every day.  If you want to prevent pregnancy, talk to your health care provider about birth control (contraception). Osteoporosis and menopause  Osteoporosis is a disease in which the bones lose minerals and strength with aging. This can result in serious bone fractures. Your risk for osteoporosis can be identified using a bone density scan.  If you are 65 years of age or older, or if you are at risk for osteoporosis and fractures, ask your health care provider if you should be screened.  Ask your health care provider whether you should take a calcium or vitamin D supplement to lower your risk for osteoporosis.  Menopause may have certain physical symptoms and risks.  Hormone replacement therapy may reduce some of these symptoms and risks. Talk to your health care provider about whether hormone replacement therapy is right for you. Follow these instructions at home:  Schedule regular health, dental, and eye exams.  Stay current with your immunizations.  Do not use any tobacco products including cigarettes, chewing tobacco, or electronic cigarettes.  If you are pregnant, do not drink alcohol.  If you are breastfeeding, limit how much and how often you drink alcohol.  Limit alcohol intake to no more  than 1 drink per day for nonpregnant women. One drink equals 12 ounces of beer, 5 ounces of wine, or 1 ounces of hard liquor.  Do not use street drugs.  Do not share needles.  Ask your health care provider for help if you need support or information about quitting drugs.  Tell your health care provider if you often feel depressed.  Tell your health care provider if you have ever been abused or do not feel safe at home. This information is not intended to replace advice given to you by your health care provider. Make sure you discuss any questions you have with your health care provider. Document Released: 05/07/2011 Document Revised: 03/29/2016 Document Reviewed: 07/26/2015 Elsevier Interactive Patient Education  2018 [employer]  

## 2017-06-24 NOTE — Progress Notes (Signed)
OFFICE VISIT  06/24/2017   CC:  Chief Complaint  Patient presents with  . Medicare Wellness  also 3 mo chronic illness f/u  HPI:    Patient is a 83 y.o. Caucasian female who presents for 3 mo f/u diet controlled DM 2, HLD, HTN. No complaints.  "I feel great".  DM 2: no home monitoring.  Says she eats a fairly good diet. Walking for exercise.  Very active.  HTN: home bp monitoring 120s/60s.  HLD: taking statin daily, no side effects.  Past Medical History:  Diagnosis Date  . Corneal dystrophies, hereditary   . Diabetes mellitus 10/2010   A1c 6.7%   . Diverticulosis   . Hyperlipemia   . Hypertension   . Hypothyroidism   . Lumbar spondylosis 05/2016   x-ray  . Osteoarthritis   . Transfusion history 1959   post partum    Past Surgical History:  Procedure Laterality Date  . APPENDECTOMY  1959  . CATARACT EXTRACTION, BILATERAL     Dr Bing Plume  . CESAREAN SECTION  1957; 5  . COLECTOMY     18 inches removed 2002-2003 for Diverticulitis  . COLONOSCOPY     Tics,Dr Sam [place]--pt declines any further screening as of 12/2015.  Marland Kitchen DEXA  06/30/2016   T score 2.5.  Repeat 3 yrs.  Marland Kitchen VAGINAL HYSTERECTOMY  1975   For Fibroids (no BSO)    Outpatient Medications Prior to Visit  Medication Sig Dispense Refill  . aspirin 81 MG tablet Take 81 mg by mouth daily.     Marland Kitchen atorvastatin (LIPITOR) 40 MG tablet Take 1 tablet (40 mg total) by mouth daily. 90 tablet 3  . cycloSPORINE (RESTASIS) 0.05 % ophthalmic emulsion Place 1 drop into both eyes 2 (two) times daily.      . hydrochlorothiazide (HYDRODIURIL) 12.5 MG tablet TAKE 1 TABLET EVERY DAY IN PLACE OF METHYCLOTHIAZIDE  90 tablet 1  . levothyroxine (SYNTHROID, LEVOTHROID) 50 MCG tablet TAKE 1 TABLET EVERY DAY  EXCEPT TAKE 1/2 TABLET ON TUESDAY AND SATURDAY 78 tablet 3  . ramipril (ALTACE) 5 MG capsule TAKE 1 CAPSULE EVERY DAY 90 capsule 1   No facility-administered medications prior to visit.     Allergies  Allergen Reactions   . Doxycycline Hyclate Diarrhea  . Penicillins     Rash Because of a history of documented adverse serious drug reaction;Medi Alert bracelet  is recommended  . Tetanus Toxoid     REACTION: Arm swelling-severe Because of a history of documented adverse serious drug reaction;Medi Alert bracelet  is recommended  . Metformin And Related Nausea And Vomiting and Other (See Comments)    Abdominal pain    ROS As per HPI  PE: Blood pressure (!) 126/58, pulse 81, temperature 98.2 F (36.8 C), temperature source Oral, resp. rate 18, height 5\' 6"  (1.676 m), weight 168 lb (76.2 kg), SpO2 95 %. Gen: Alert, well appearing.  Patient is oriented to person, place, time, and situation. AFFECT: pleasant, lucid thought and speech. R hand dorsum with 1-2 cm oval pink nodule with crusty central region. Left forearm with flat topped papule about 5 mm diameter, light brown homogenous pigment, slight stuck-on appearance. No further exam today.  LABS:  Lab Results  Component Value Date   TSH 0.53 12/24/2016   Lab Results  Component Value Date   WBC 5.1 12/24/2016   HGB 14.7 12/24/2016   HCT 44.0 12/24/2016   MCV 92.8 12/24/2016   PLT 208.0 12/24/2016   Lab Results  Component Value Date   CREATININE 0.77 03/26/2017   BUN 22 03/26/2017   NA 143 03/26/2017   K 4.3 03/26/2017   CL 102 03/26/2017   CO2 35 (H) 03/26/2017   Lab Results  Component Value Date   ALT 18 12/24/2016   AST 18 12/24/2016   ALKPHOS 68 12/24/2016   BILITOT 0.8 12/24/2016   Lab Results  Component Value Date   CHOL 155 03/26/2017   Lab Results  Component Value Date   HDL 55.60 03/26/2017   Lab Results  Component Value Date   LDLCALC 81 03/26/2017   Lab Results  Component Value Date   TRIG 92.0 03/26/2017   Lab Results  Component Value Date   CHOLHDL 3 03/26/2017   Lab Results  Component Value Date   HGBA1C 6.8 06/24/2017   IMPRESSION AND PLAN:  1) DM 2; historically well controlled. POC A1c 6.8%  today--better! Encouragement given to pt today.  2) HTN: The current medical regimen is effective;  continue present plan and medications. Lytes/cr good 03/2017.  Will repeat at next f/u in 3 mo.  3) HLD: tolerating statin.  Lipid panel excellent 03/2017. Plan repeat at next f/u visit if pt fasting.  4) Skin lesions: dorsum of R hand and left forearm--two lesions total.  Hand lesions --suspect AK. Forearm lesion--possible seb keratosis lesion vs precancerous lesion.  Refer to skin surgery center as per pt preference.  An After Visit Summary was printed and given to the patient.  FOLLOW UP: Return in about 3 months (around 09/24/2017) for routine chronic illness f/u.  Signed:  Crissie Sickles, MD           06/24/2017

## 2017-06-24 NOTE — Progress Notes (Signed)
AWV reviewed and agree.  Signed:  Crissie Sickles, MD           06/24/2017

## 2017-08-05 DIAGNOSIS — Z23 Encounter for immunization: Secondary | ICD-10-CM | POA: Diagnosis not present

## 2017-08-20 ENCOUNTER — Other Ambulatory Visit: Payer: Self-pay | Admitting: Dermatology

## 2017-08-20 DIAGNOSIS — D485 Neoplasm of uncertain behavior of skin: Secondary | ICD-10-CM | POA: Diagnosis not present

## 2017-08-20 DIAGNOSIS — L821 Other seborrheic keratosis: Secondary | ICD-10-CM | POA: Diagnosis not present

## 2017-08-20 DIAGNOSIS — L570 Actinic keratosis: Secondary | ICD-10-CM | POA: Diagnosis not present

## 2017-08-20 DIAGNOSIS — C44311 Basal cell carcinoma of skin of nose: Secondary | ICD-10-CM | POA: Diagnosis not present

## 2017-08-20 DIAGNOSIS — D229 Melanocytic nevi, unspecified: Secondary | ICD-10-CM | POA: Diagnosis not present

## 2017-08-20 DIAGNOSIS — L820 Inflamed seborrheic keratosis: Secondary | ICD-10-CM | POA: Diagnosis not present

## 2017-09-18 ENCOUNTER — Other Ambulatory Visit: Payer: Self-pay | Admitting: Family Medicine

## 2017-09-24 ENCOUNTER — Ambulatory Visit: Payer: Medicare HMO | Admitting: Family Medicine

## 2017-09-24 ENCOUNTER — Other Ambulatory Visit: Payer: Self-pay

## 2017-09-24 ENCOUNTER — Encounter: Payer: Self-pay | Admitting: Family Medicine

## 2017-09-24 ENCOUNTER — Other Ambulatory Visit: Payer: Self-pay | Admitting: Family Medicine

## 2017-09-24 VITALS — BP 144/74 | HR 74 | Temp 97.6°F | Resp 16 | Ht 66.0 in | Wt 165.5 lb

## 2017-09-24 DIAGNOSIS — I10 Essential (primary) hypertension: Secondary | ICD-10-CM

## 2017-09-24 DIAGNOSIS — E119 Type 2 diabetes mellitus without complications: Secondary | ICD-10-CM

## 2017-09-24 DIAGNOSIS — E7800 Pure hypercholesterolemia, unspecified: Secondary | ICD-10-CM | POA: Diagnosis not present

## 2017-09-24 DIAGNOSIS — Z1231 Encounter for screening mammogram for malignant neoplasm of breast: Secondary | ICD-10-CM

## 2017-09-24 LAB — LIPID PANEL
CHOLESTEROL: 168 mg/dL (ref 0–200)
HDL: 57.7 mg/dL (ref >39.00)
LDL Cholesterol: 93 mg/dL (ref 0–99)
NonHDL: 110.12
Total CHOL/HDL Ratio: 3
Triglycerides: 84 mg/dL (ref 0.0–149.0)
VLDL: 16.8 mg/dL (ref 0.0–40.0)

## 2017-09-24 LAB — BASIC METABOLIC PANEL WITH GFR
BUN: 21 mg/dL (ref 6–23)
CO2: 34 meq/L — ABNORMAL HIGH (ref 19–32)
Calcium: 9.5 mg/dL (ref 8.4–10.5)
Chloride: 101 meq/L (ref 96–112)
Creatinine, Ser: 0.71 mg/dL (ref 0.40–1.20)
GFR: 83.89 mL/min
Glucose, Bld: 130 mg/dL — ABNORMAL HIGH (ref 70–99)
Potassium: 3.8 meq/L (ref 3.5–5.1)
Sodium: 141 meq/L (ref 135–145)

## 2017-09-24 LAB — HEMOGLOBIN A1C: Hgb A1c MFr Bld: 7 % — ABNORMAL HIGH (ref 4.6–6.5)

## 2017-09-24 NOTE — Progress Notes (Signed)
OFFICE VISIT  09/24/2017   CC:  Chief Complaint  Patient presents with  . Follow-up    [name], pt is fasting    HPI:    Patient is a 83 y.o. Caucasian female who presents for f/u diet-controlled DM 2, HTN, HLD. Feeling fine, does no formal exercise but is NOT sedentary.  HTN: home bp's 120s/70s consistently. DM 2: diabetic diet.  No home glucose monitoring. HLD: tolerating statin well.  ROS: no CP, no SOB, no LE swelling, no vision c/o, no polyuria or polydipsia.  Past Medical History:  Diagnosis Date  . Corneal dystrophies, hereditary   . Diabetes mellitus 10/2010   A1c 6.7%   . Diverticulosis   . Hyperlipemia   . Hypertension   . Hypothyroidism   . Lumbar spondylosis 05/2016   x-ray  . Osteoarthritis   . Transfusion history 1959   post partum    Past Surgical History:  Procedure Laterality Date  . APPENDECTOMY  1959  . CATARACT EXTRACTION, BILATERAL     Dr Bing Plume  . CESAREAN SECTION  1957; 86  . COLECTOMY     18 inches removed 2002-2003 for Diverticulitis  . COLONOSCOPY     Tics,Dr Sam Belmar--pt declines any further screening as of 12/2015.  Marland Kitchen DEXA  06/30/2016   T score 2.5.  Repeat 3 yrs.  Marland Kitchen VAGINAL HYSTERECTOMY  1975   For Fibroids (no BSO)    Outpatient Medications Prior to Visit  Medication Sig Dispense Refill  . aspirin 81 MG tablet Take 81 mg by mouth daily.     Marland Kitchen atorvastatin (LIPITOR) 40 MG tablet Take 1 tablet (40 mg total) by mouth daily. 90 tablet 3  . cycloSPORINE (RESTASIS) 0.05 % ophthalmic emulsion Place 1 drop into both eyes 2 (two) times daily.      . hydrochlorothiazide (HYDRODIURIL) 12.5 MG tablet TAKE 1 TABLET EVERY DAY IN PLACE OF METHYCLOTHIAZIDE  90 tablet 1  . levothyroxine (SYNTHROID, LEVOTHROID) 50 MCG tablet TAKE 1 TABLET EVERY DAY  EXCEPT TAKE 1/2 TABLET ON TUESDAY AND SATURDAY 78 tablet 3  . ramipril (ALTACE) 5 MG capsule TAKE 1 CAPSULE EVERY DAY 90 capsule 1  . SHINGRIX injection      No facility-administered medications  prior to visit.     Allergies  Allergen Reactions  . Doxycycline Hyclate Diarrhea  . Penicillins     Rash Because of a history of documented adverse serious drug reaction;Medi Alert bracelet  is recommended  . Tetanus Toxoid     REACTION: Arm swelling-severe Because of a history of documented adverse serious drug reaction;Medi Alert bracelet  is recommended  . Metformin And Related Nausea And Vomiting and Other (See Comments)    Abdominal pain    ROS As per HPI  PE: Blood pressure (!) 145/79, pulse 74, temperature 97.6 F (36.4 C), temperature source Oral, resp. rate 16, height 5\' 6"  (1.676 m), weight 165 lb 8 oz (75.1 kg), SpO2 94 %. Gen: Alert, well appearing.  Patient is oriented to person, place, time, and situation. CV: RRR, no m/r/g.   LUNGS: CTA bilat, nonlabored resps, good aeration in all lung fields. EXT: no clubbing, cyanosis, or edema.    LABS:  Lab Results  Component Value Date   TSH 0.53 12/24/2016   Lab Results  Component Value Date   WBC 5.1 12/24/2016   HGB 14.7 12/24/2016   HCT 44.0 12/24/2016   MCV 92.8 12/24/2016   PLT 208.0 12/24/2016   Lab Results  Component Value Date  CREATININE 0.77 03/26/2017   BUN 22 03/26/2017   NA 143 03/26/2017   K 4.3 03/26/2017   CL 102 03/26/2017   CO2 35 (H) 03/26/2017   Lab Results  Component Value Date   ALT 18 12/24/2016   AST 18 12/24/2016   ALKPHOS 68 12/24/2016   BILITOT 0.8 12/24/2016   Lab Results  Component Value Date   CHOL 155 03/26/2017   Lab Results  Component Value Date   HDL 55.60 03/26/2017   Lab Results  Component Value Date   LDLCALC 81 03/26/2017   Lab Results  Component Value Date   TRIG 92.0 03/26/2017   Lab Results  Component Value Date   CHOLHDL 3 03/26/2017   Lab Results  Component Value Date   HGBA1C 6.8 06/24/2017    IMPRESSION AND PLAN:  1) DM 2: historically well controlled on diet alone. HbA1c today.  2) HTN: great per home monitoring.  No changes in  meds today. BMET today.  3) Hyperlipidemia: tolerating statin.  FLP today.  An After Visit Summary was printed and given to the patient.  FOLLOW UP: Return in about 3 months (around 12/25/2017) for annual CPE (fasting).  Signed:  Crissie Sickles, MD           09/24/2017

## 2017-09-24 NOTE — Patient Instructions (Signed)
Good to see you.  Happy holidays.

## 2017-12-09 ENCOUNTER — Ambulatory Visit
Admission: RE | Admit: 2017-12-09 | Discharge: 2017-12-09 | Disposition: A | Discharge status: Home / Self Care | Payer: Medicare HMO | Source: Ambulatory Visit | Attending: Family Medicine | Admitting: Family Medicine

## 2017-12-09 DIAGNOSIS — Z1231 Encounter for screening mammogram for malignant neoplasm of breast: Secondary | ICD-10-CM | POA: Diagnosis not present

## 2017-12-10 DIAGNOSIS — D225 Melanocytic nevi of trunk: Secondary | ICD-10-CM | POA: Diagnosis not present

## 2017-12-10 DIAGNOSIS — Z85828 Personal history of other malignant neoplasm of skin: Secondary | ICD-10-CM | POA: Diagnosis not present

## 2017-12-10 DIAGNOSIS — L821 Other seborrheic keratosis: Secondary | ICD-10-CM | POA: Diagnosis not present

## 2017-12-26 ENCOUNTER — Ambulatory Visit (INDEPENDENT_AMBULATORY_CARE_PROVIDER_SITE_OTHER): Payer: Medicare HMO | Admitting: Family Medicine

## 2017-12-26 ENCOUNTER — Encounter: Payer: Self-pay | Admitting: Family Medicine

## 2017-12-26 VITALS — BP 150/92 | HR 77 | Temp 97.5°F | Resp 16 | Ht 66.0 in | Wt 165.5 lb

## 2017-12-26 DIAGNOSIS — E7800 Pure hypercholesterolemia, unspecified: Secondary | ICD-10-CM

## 2017-12-26 DIAGNOSIS — E119 Type 2 diabetes mellitus without complications: Secondary | ICD-10-CM | POA: Diagnosis not present

## 2017-12-26 DIAGNOSIS — E039 Hypothyroidism, unspecified: Secondary | ICD-10-CM

## 2017-12-26 DIAGNOSIS — Z0000 Encounter for general adult medical examination without abnormal findings: Secondary | ICD-10-CM

## 2017-12-26 LAB — TSH: TSH: 0.59 u[IU]/mL (ref 0.35–4.50)

## 2017-12-26 LAB — LIPID PANEL
CHOL/HDL RATIO: 3
Cholesterol: 169 mg/dL (ref 0–200)
HDL: 58.8 mg/dL (ref >39.00)
LDL CALC: 90 mg/dL (ref 0–99)
NONHDL: 110.18
Triglycerides: 100 mg/dL (ref 0.0–149.0)
VLDL: 20 mg/dL (ref 0.0–40.0)

## 2017-12-26 LAB — COMPREHENSIVE METABOLIC PANEL
ALT: 21 U/L (ref 0–35)
AST: 18 U/L (ref 0–37)
Albumin: 3.7 g/dL (ref 3.5–5.2)
Alkaline Phosphatase: 76 U/L (ref 39–117)
BUN: 23 mg/dL (ref 6–23)
CHLORIDE: 100 meq/L (ref 96–112)
CO2: 35 meq/L — AB (ref 19–32)
Calcium: 9.9 mg/dL (ref 8.4–10.5)
Creatinine, Ser: 0.77 mg/dL (ref 0.40–1.20)
GFR: 76.35 mL/min (ref >60.00)
GLUCOSE: 142 mg/dL — AB (ref 70–99)
POTASSIUM: 4.5 meq/L (ref 3.5–5.1)
SODIUM: 140 meq/L (ref 135–145)
TOTAL PROTEIN: 6.4 g/dL (ref 6.0–8.3)
Total Bilirubin: 0.8 mg/dL (ref 0.2–1.2)

## 2017-12-26 LAB — HEMOGLOBIN A1C: Hgb A1c MFr Bld: 7.3 % — ABNORMAL HIGH (ref 4.6–6.5)

## 2017-12-26 NOTE — Progress Notes (Signed)
OFFICE VISIT  12/26/2017   CC:  Chief Complaint  Patient presents with  . Annual Exam    Pt is fasting.    HPI:    Patient is a 84 y.o. Caucasian female who presents for annual health maintenance exam. Chronic illnesses are DM 2, HTN, hypothyroidism.  HTN: home bp consistently 120/60.  Eye: exam scheduled for 01/09/18 Dental: preventatives q71mo. Exercise: active/not sedentary/caring for husband.  No formal exercise. Diet: eats healthy diet, tries to limit carbs and fats.  Past Medical History:  Diagnosis Date  . Corneal dystrophies, hereditary   . Diabetes mellitus 10/2010   A1c 6.7%   . Diverticulosis   . Hyperlipemia   . Hypertension   . Hypothyroidism   . Lumbar spondylosis 05/2016   x-ray  . Osteoarthritis   . Transfusion history 1959   post partum    Past Surgical History:  Procedure Laterality Date  . APPENDECTOMY  1959  . CATARACT EXTRACTION, BILATERAL     Dr Bing Plume  . CESAREAN SECTION  1957; 49  . COLECTOMY     18 inches removed 2002-2003 for Diverticulitis  . COLONOSCOPY     Tics,Dr Sam Balfour--pt declines any further screening as of 12/2015.  Marland Kitchen DEXA  06/30/2016   T score 2.5.  Repeat 3 yrs.  Marland Kitchen VAGINAL HYSTERECTOMY  1975   For Fibroids (no BSO)   Social History   Socioeconomic History  . Marital status: Married    Spouse name: None  . Number of children: None  . Years of education: None  . Highest education level: None  Social Needs  . Financial resource strain: None  . Food insecurity - worry: None  . Food insecurity - inability: None  . Transportation needs - medical: None  . Transportation needs - non-medical: None  Occupational History  . None  Tobacco Use  . Smoking status: Never Smoker  . Smokeless tobacco: Never Used  Substance and Sexual Activity  . Alcohol use: No  . Drug use: No  . Sexual activity: None  Other Topics Concern  . None  Social History Narrative   Married, 2 daughters.   Occupation: retired Games developer).   Silver sneakers.   No T/A/Ds.   Family History  Problem Relation Age of Onset  . Heart attack Mother 44  . Hypertension Mother   . Tuberculosis Father   . Diabetes Brother   . Stroke Brother 40  . Prostate cancer Brother     Outpatient Medications Prior to Visit  Medication Sig Dispense Refill  . aspirin 81 MG tablet Take 81 mg by mouth daily.     Marland Kitchen atorvastatin (LIPITOR) 40 MG tablet Take 1 tablet (40 mg total) by mouth daily. 90 tablet 3  . cycloSPORINE (RESTASIS) 0.05 % ophthalmic emulsion Place 1 drop into both eyes 2 (two) times daily.      . hydrochlorothiazide (HYDRODIURIL) 12.5 MG tablet TAKE 1 TABLET EVERY DAY IN PLACE OF METHYCLOTHIAZIDE  90 tablet 1  . levothyroxine (SYNTHROID, LEVOTHROID) 50 MCG tablet TAKE 1 TABLET EVERY DAY  EXCEPT TAKE 1/2 TABLET ON TUESDAY AND SATURDAY 78 tablet 3  . ramipril (ALTACE) 5 MG capsule TAKE 1 CAPSULE EVERY DAY 90 capsule 1   No facility-administered medications prior to visit.     Allergies  Allergen Reactions  . Doxycycline Hyclate Diarrhea  . Penicillins     Rash Because of a history of documented adverse serious drug reaction;Medi Alert bracelet  is recommended  . Tetanus Toxoid  REACTION: Arm swelling-severe Because of a history of documented adverse serious drug reaction;Medi Alert bracelet  is recommended  . Metformin And Related Nausea And Vomiting and Other (See Comments)    Abdominal pain    ROS Review of Systems  Constitutional: Negative for appetite change, chills, fatigue and fever.  HENT: Negative for congestion, dental problem, ear pain and sore throat.   Eyes: Negative for discharge, redness and visual disturbance.  Respiratory: Negative for cough, chest tightness, shortness of breath and wheezing.   Cardiovascular: Negative for chest pain, palpitations and leg swelling.  Gastrointestinal: Negative for abdominal pain, blood in stool, diarrhea, nausea and vomiting.  Genitourinary:  Negative for difficulty urinating, dysuria, flank pain, frequency, hematuria and urgency.  Musculoskeletal: Negative for arthralgias, back pain, joint swelling, myalgias and neck stiffness.  Skin: Negative for pallor and rash.  Neurological: Negative for dizziness, speech difficulty, weakness and headaches.  Hematological: Negative for adenopathy. Does not bruise/bleed easily.  Psychiatric/Behavioral: Negative for confusion and sleep disturbance. The patient is not nervous/anxious.     PE: Initial bp today was 147/81 Blood pressure (!) 150/92, pulse 77, temperature (!) 97.5 F (36.4 C), temperature source Oral, resp. rate 16, height 5\' 6"  (1.676 m), weight 165 lb 8 oz (75.1 kg), SpO2 94 %. Body mass index is 26.71 kg/m.  Pt examined with Helayne Seminole, CMA, as chaperone. Gen: Alert, well appearing.  Patient is oriented to person, place, time, and situation. AFFECT: pleasant, lucid thought and speech. ENT: Ears: EACs clear, normal epithelium.  TMs with good light reflex and landmarks bilaterally.  Eyes: no injection, icteris, swelling, or exudate.  EOMI, PERRLA. Nose: no drainage or turbinate edema/swelling.  No injection or focal lesion.  Mouth: lips without lesion/swelling.  Oral mucosa pink and moist.  Dentition intact and without obvious caries or gingival swelling.  Oropharynx without erythema, exudate, or swelling.  Neck: supple/nontender.  No LAD, mass, or TM.  Carotid pulses 2+ bilaterally, without bruits. CV: RRR, no m/r/g.   LUNGS: CTA bilat, nonlabored resps, good aeration in all lung fields. ABD: soft, NT, ND, BS normal.  No hepatospenomegaly or mass.  No bruits. EXT: no clubbing, cyanosis, or edema.  Musculoskeletal: no joint swelling, erythema, warmth, or tenderness.  ROM of all joints intact. Skin - no sores or suspicious lesions or rashes or color changes   LABS:  Lab Results  Component Value Date   TSH 0.53 12/24/2016   Lab Results  Component Value Date   WBC  5.1 12/24/2016   HGB 14.7 12/24/2016   HCT 44.0 12/24/2016   MCV 92.8 12/24/2016   PLT 208.0 12/24/2016   Lab Results  Component Value Date   CREATININE 0.71 09/24/2017   BUN 21 09/24/2017   NA 141 09/24/2017   K 3.8 09/24/2017   CL 101 09/24/2017   CO2 34 (H) 09/24/2017   Lab Results  Component Value Date   ALT 18 12/24/2016   AST 18 12/24/2016   ALKPHOS 68 12/24/2016   BILITOT 0.8 12/24/2016   Lab Results  Component Value Date   CHOL 168 09/24/2017   Lab Results  Component Value Date   HDL 57.70 09/24/2017   Lab Results  Component Value Date   LDLCALC 93 09/24/2017   Lab Results  Component Value Date   TRIG 84.0 09/24/2017   Lab Results  Component Value Date   CHOLHDL 3 09/24/2017   Lab Results  Component Value Date   HGBA1C 7.0 (H) 09/24/2017    IMPRESSION AND  PLAN:  Health maintenance exam: Reviewed age and gender appropriate health maintenance issues (prudent diet, regular exercise, health risks of tobacco and excessive alcohol, use of seatbelts, fire alarms in home, use of sunscreen).  Also reviewed age and gender appropriate health screening as well as vaccine recommendations. Vaccines: all UTD. Labs: A1c, CMET, FLP, TSH. Cervical ca screening: n/a due to hysterectomy for benign reason. Breast ca screening: Mammog UTD (12/08/17--normal). Colon ca screening: pt declines any further screening. DEXA: next one due 2020.  An After Visit Summary was printed and given to the patient.  FOLLOW UP: Return in about 3 months (around 03/25/2018) for routine chronic illness f/u.  Signed:  Crissie Sickles, MD           12/26/2017

## 2017-12-26 NOTE — Patient Instructions (Signed)

## 2018-01-09 DIAGNOSIS — H40013 Open angle with borderline findings, low risk, bilateral: Secondary | ICD-10-CM | POA: Diagnosis not present

## 2018-01-09 DIAGNOSIS — H04123 Dry eye syndrome of bilateral lacrimal glands: Secondary | ICD-10-CM | POA: Diagnosis not present

## 2018-01-09 DIAGNOSIS — H353131 Nonexudative age-related macular degeneration, bilateral, early dry stage: Secondary | ICD-10-CM | POA: Diagnosis not present

## 2018-01-09 DIAGNOSIS — H524 Presbyopia: Secondary | ICD-10-CM | POA: Diagnosis not present

## 2018-01-09 DIAGNOSIS — H1850 Unspecified hereditary corneal dystrophies: Secondary | ICD-10-CM | POA: Diagnosis not present

## 2018-01-09 LAB — HM DIABETES EYE EXAM

## 2018-01-15 ENCOUNTER — Encounter: Payer: Self-pay | Admitting: Family Medicine

## 2018-02-03 DIAGNOSIS — H353132 Nonexudative age-related macular degeneration, bilateral, intermediate dry stage: Secondary | ICD-10-CM | POA: Diagnosis not present

## 2018-02-04 ENCOUNTER — Other Ambulatory Visit: Payer: Self-pay | Admitting: Family Medicine

## 2018-03-05 DIAGNOSIS — H353132 Nonexudative age-related macular degeneration, bilateral, intermediate dry stage: Secondary | ICD-10-CM | POA: Diagnosis not present

## 2018-03-18 ENCOUNTER — Telehealth: Payer: Self-pay | Admitting: Family Medicine

## 2018-03-18 MED ORDER — HYDROCHLOROTHIAZIDE 12.5 MG PO TABS: ORAL_TABLET | ORAL | 1 refills | Status: DC

## 2018-03-18 MED ORDER — ATORVASTATIN CALCIUM 40 MG PO TABS: 40.0000 mg | ORAL_TABLET | Freq: Every day | ORAL | 1 refills | Status: DC

## 2018-03-18 MED ORDER — LEVOTHYROXINE SODIUM 50 MCG PO TABS: ORAL_TABLET | ORAL | 1 refills | Status: DC

## 2018-03-18 NOTE — Telephone Encounter (Signed)
Copied from [place] [phone]. Topic: Quick Communication - Rx Refill/Question >> Mar 18, 2018  8:30 AM Aurelio Brash B wrote:  PT states she only has 5 and wants to know if this can be sent today  Medication:atorvastatin (LIPITOR) 40 MG tablet  hydrochlorothiazide (HYDRODIURIL) 12.5 MG tablet  levothyroxine (SYNTHROID, LEVOTHROID) 50 MCG tablet  Has the patient contacted their pharmacy? No   (Agent: If no, request that the patient contact the pharmacy for the refill.)  Preferred Pharmacy (with phone number or street name): [facility], [place] [phone] (Phone) [phone] (Fax)      Agent: Please be advised that RX refills may take up to 3 business days. We ask that you follow-up with your pharmacy.

## 2018-03-25 ENCOUNTER — Ambulatory Visit: Payer: Medicare HMO | Admitting: Family Medicine

## 2018-04-03 ENCOUNTER — Encounter: Payer: Self-pay | Admitting: Family Medicine

## 2018-04-03 ENCOUNTER — Ambulatory Visit (INDEPENDENT_AMBULATORY_CARE_PROVIDER_SITE_OTHER): Payer: Medicare HMO | Admitting: Family Medicine

## 2018-04-03 VITALS — BP 132/76 | HR 75 | Temp 98.1°F | Resp 16 | Ht 66.0 in | Wt 160.5 lb

## 2018-04-03 DIAGNOSIS — M7582 Other shoulder lesions, left shoulder: Secondary | ICD-10-CM | POA: Diagnosis not present

## 2018-04-03 DIAGNOSIS — E119 Type 2 diabetes mellitus without complications: Secondary | ICD-10-CM

## 2018-04-03 DIAGNOSIS — E663 Overweight: Secondary | ICD-10-CM

## 2018-04-03 DIAGNOSIS — I10 Essential (primary) hypertension: Secondary | ICD-10-CM

## 2018-04-03 LAB — POCT GLYCOSYLATED HEMOGLOBIN (HGB A1C): HbA1c, POC (controlled diabetic range): 6.5 % (ref 0.0–7.0)

## 2018-04-03 NOTE — Progress Notes (Signed)
OFFICE VISIT  04/03/2018   CC:  Chief Complaint  Patient presents with  . Follow-up    [name], pt is not fasting.    HPI:  Patient is a 84 y.o. Caucasian female who presents for f/u HTN, DM 2, and new L shoulder pain. Last visit her labs were stable except HbA1c was up to 7.3%.  I recommended she start pioglitazone 15mg  qd but she chose to decline this and try to work more on TLC.  DM 2: she cut out potatoes and rice and simple sugars, ate small portion size. No change in her very active life/activity level.  HTN: home bp monitoring: consistently <130/80.  Compliant with meds.  Left shoulder pain: onset 3-4 wks ago, mild nagging pain, worsened by raising arm above head to reach into cabinet. No neck pain.  No radiation of the pain down arm.  No paresthesias.  No recent injury or strain.   Takes tylenol occ.  NSAIDs upset her stomach too much.  ROS: no polyuria or polydipsia, no rash, no myalgias, no fevers, no HAs, no dizziness, no palpitations.  Past Medical History:  Diagnosis Date  . Corneal dystrophies, hereditary   . Diabetes mellitus 10/2010   A1c 6.7%   . Diverticulosis   . Hyperlipemia   . Hypertension   . Hypothyroidism   . Lumbar spondylosis 05/2016   x-ray  . Osteoarthritis   . Transfusion history 1959   post partum    Past Surgical History:  Procedure Laterality Date  . APPENDECTOMY  1959  . CATARACT EXTRACTION, BILATERAL     Dr Bing Plume  . CESAREAN SECTION  1957; 86  . COLECTOMY     18 inches removed 2002-2003 for Diverticulitis  . COLONOSCOPY     Tics,Dr Sam Rhodhiss--pt declines any further screening as of 12/2015.  Marland Kitchen DEXA  06/30/2016   T score 2.5.  Repeat 3 yrs.  Marland Kitchen VAGINAL HYSTERECTOMY  1975   For Fibroids (no BSO)    Outpatient Medications Prior to Visit  Medication Sig Dispense Refill  . aspirin 81 MG tablet Take 81 mg by mouth daily.     Marland Kitchen atorvastatin (LIPITOR) 40 MG tablet Take 1 tablet (40 mg total) by mouth daily. 90 tablet 1  .  cycloSPORINE (RESTASIS) 0.05 % ophthalmic emulsion Place 1 drop into both eyes 2 (two) times daily.      . hydrochlorothiazide (HYDRODIURIL) 12.5 MG tablet TAKE 1 TABLET EVERY DAY IN PLACE OF METHYCLOTHIAZIDE 90 tablet 1  . levothyroxine (SYNTHROID, LEVOTHROID) 50 MCG tablet TAKE 1 TABLET EVERY DAY  EXCEPT TAKE 1/2 TABLET ON TUESDAY AND SATURDAY 78 tablet 1  . ramipril (ALTACE) 5 MG capsule TAKE 1 CAPSULE EVERY DAY 90 capsule 1   No facility-administered medications prior to visit.     Allergies  Allergen Reactions  . Doxycycline Hyclate Diarrhea  . Penicillins     Rash Because of a history of documented adverse serious drug reaction;Medi Alert bracelet  is recommended  . Tetanus Toxoid     REACTION: Arm swelling-severe Because of a history of documented adverse serious drug reaction;Medi Alert bracelet  is recommended  . Metformin And Related Nausea And Vomiting and Other (See Comments)    Abdominal pain    ROS As per HPI  PE: Repeat bp today was 132/76 Blood pressure 132/76, pulse 75, temperature 98.1 F (36.7 C), temperature source Oral, resp. rate 16, height 5\' 6"  (1.676 m), weight 160 lb 8 oz (72.8 kg), SpO2 95 %. Gen: Alert,  well appearing.  Patient is oriented to person, place, time, and situation. AFFECT: pleasant, lucid thought and speech. CV: RRR, no m/r/g.   LUNGS: CTA bilat, nonlabored resps, good aeration in all lung fields. Left shoulder: Pain in L shoulder anterolaterally with aBduction starting at about 70 deg, but has full aBduction intact. Neg drop sign.  +Focal TTP over lateral acromion.  No AC joint tenderness.  ER/IR w/out pain or weakness. Speeds and Yergasons neg. UE strength 5/5 prox/dist bilat.  LABS:  Lab Results  Component Value Date   TSH 0.59 12/26/2017   Lab Results  Component Value Date   WBC 5.1 12/24/2016   HGB 14.7 12/24/2016   HCT 44.0 12/24/2016   MCV 92.8 12/24/2016   PLT 208.0 12/24/2016   Lab Results  Component Value Date    CREATININE 0.77 12/26/2017   BUN 23 12/26/2017   NA 140 12/26/2017   K 4.5 12/26/2017   CL 100 12/26/2017   CO2 35 (H) 12/26/2017   Lab Results  Component Value Date   ALT 21 12/26/2017   AST 18 12/26/2017   ALKPHOS 76 12/26/2017   BILITOT 0.8 12/26/2017   Lab Results  Component Value Date   CHOL 169 12/26/2017   Lab Results  Component Value Date   HDL 58.80 12/26/2017   Lab Results  Component Value Date   LDLCALC 90 12/26/2017   Lab Results  Component Value Date   TRIG 100.0 12/26/2017   Lab Results  Component Value Date   CHOLHDL 3 12/26/2017   Lab Results  Component Value Date   HGBA1C 6.5 04/03/2018   POC HbA1c today=6.5%  IMPRESSION AND PLAN:  1) DM 2: great response to recent TLC: a1c improved from 7.3% to 6.5%. Keep it up.  2) HTN: The current medical regimen is effective;  continue present plan and medications. No labs needed today.  3) Left rotator cuff tendonitis; mild. She cannot tolerate NSaid's. Activity modification discussed. Home therapy handout discussed and given to pt today. Tylenol 1000 mg q6h prn.  An After Visit Summary was printed and given to the patient.  FOLLOW UP: Return in about 3 months (around 07/04/2018) for routine chronic illness f/u.  Signed:  Crissie Sickles, MD           04/03/2018

## 2018-04-03 NOTE — Patient Instructions (Signed)
Rotator Cuff Tendinitis Rotator cuff tendinitis is inflammation of the tough, cord-like bands that connect muscle to bone (tendons) in the rotator cuff. The rotator cuff includes all of the muscles and tendons that connect the arm to the shoulder. The rotator cuff holds the head of the upper arm bone (humerus) in the cup (fossa) of the shoulder blade (scapula). This condition can lead to a long-lasting (chronic) tear. The tear may be partial or complete. What are the causes? This condition is usually caused by overusing the rotator cuff. What increases the risk? This condition is more likely to develop in athletes and workers who frequently use their shoulder or reach over their heads. This can include activities such as:  Tennis.  Baseball or softball.  Swimming.  Construction work.  Painting.  What are the signs or symptoms? Symptoms of this condition include:  Pain spreading (radiating) from the shoulder to the upper arm.  Swelling and tenderness in front of the shoulder.  Pain when reaching, pulling, or lifting the arm above the head.  Pain when lowering the arm from above the head.  Minor pain in the shoulder when resting.  Increased pain in the shoulder at night.  Difficulty placing the arm behind the back.  How is this diagnosed? This condition is diagnosed with a medical history and physical exam. Tests may also be done, including:  X-rays.  MRI.  Ultrasounds.  CT or MR arthrogram. During this test, a contrast material is injected and then images are taken.  How is this treated? Treatment for this condition depends on the severity of the condition. In less severe cases, treatment may include:  Rest. This may be done with a sling that holds the shoulder still (immobilization). Your health care provider may also recommend avoiding activities that involve lifting your arm over your head.  Icing the shoulder.  Anti-inflammatory medicines, such as aspirin or  ibuprofen.  In more severe cases, treatment may include:  Physical therapy.  Steroid injections.  Surgery.  Follow these instructions at home: If you have a sling:  Wear the sling as told by your health care provider. Remove it only as told by your health care provider.  Loosen the sling if your fingers tingle, become numb, or turn cold and blue.  Keep the sling clean.  If the sling is not waterproof, do not let it get wet. Remove it, if allowed, or cover it with a watertight covering when you take a bath or shower. Managing pain, stiffness, and swelling  If directed, put ice on the injured area. ? If you have a removable sling, remove it as told by your health care provider. ? Put ice in a plastic bag. ? Place a towel between your skin and the bag. ? Leave the ice on for 20 minutes, 2-3 times a day.  Move your fingers often to avoid stiffness and to lessen swelling.  Raise (elevate) the injured area above the level of your heart while you are lying down.  Find a comfortable sleeping position or sleep on a recliner, if available. Driving  Do not drive or use heavy machinery while taking prescription pain medicine.  Ask your health care provider when it is safe to drive if you have a sling on your arm. Activity  Rest your shoulder as told by your health care provider.  Return to your normal activities as told by your health care provider. Ask your health care provider what activities are safe for you.  Do any   exercises or stretches as told by your health care provider.  If you do repetitive overhead tasks, take small breaks in between and include stretching exercises as told by your health care provider. General instructions  Do not use any products that contain nicotine or tobacco, such as cigarettes and e-cigarettes. These can delay healing. If you need help quitting, ask your health care provider.  Take over-the-counter and prescription medicines only as told by  your health care provider.  Keep all follow-up visits as told by your health care provider. This is important. Contact a health care provider if:  Your pain gets worse.  You have new pain in your arm, hands, or fingers.  Your pain is not relieved with medicine or does not get better after 6 weeks of treatment.  You have cracking sensations when moving your shoulder in certain directions.  You hear a snapping sound after using your shoulder, followed by severe pain and weakness. Get help right away if:  Your arm, hand, or fingers are numb or tingling.  Your arm, hand, or fingers are swollen or painful or they turn white or blue. Summary  Rotator cuff tendinitis is inflammation of the tough, cord-like bands that connect muscle to bone (tendons) in the rotator cuff.  This condition is usually caused by overusing the rotator cuff, which includes all of the muscles and tendons that connect the arm to the shoulder.  This condition is more likely to develop in athletes and workers who frequently use their shoulder or reach over their heads.  Treatment generally includes rest, anti-inflammatory medicines, and icing. In some cases, physical therapy and steroid injections may be needed. In severe cases, surgery may be needed. This information is not intended to replace advice given to you by your health care provider. Make sure you discuss any questions you have with your health care provider. Document Released: 01/12/2004 Document Revised: 10/08/2016 Document Reviewed: 10/08/2016 [employer] Interactive Patient Education  2017 [employer]  

## 2018-04-05 DIAGNOSIS — H353132 Nonexudative age-related macular degeneration, bilateral, intermediate dry stage: Secondary | ICD-10-CM | POA: Diagnosis not present

## 2018-05-05 DIAGNOSIS — H353132 Nonexudative age-related macular degeneration, bilateral, intermediate dry stage: Secondary | ICD-10-CM | POA: Diagnosis not present

## 2018-06-05 DIAGNOSIS — H353132 Nonexudative age-related macular degeneration, bilateral, intermediate dry stage: Secondary | ICD-10-CM | POA: Diagnosis not present

## 2018-07-04 ENCOUNTER — Encounter: Payer: Self-pay | Admitting: Family Medicine

## 2018-07-04 ENCOUNTER — Other Ambulatory Visit: Payer: Self-pay

## 2018-07-04 ENCOUNTER — Ambulatory Visit (INDEPENDENT_AMBULATORY_CARE_PROVIDER_SITE_OTHER): Payer: Medicare HMO

## 2018-07-04 ENCOUNTER — Ambulatory Visit (INDEPENDENT_AMBULATORY_CARE_PROVIDER_SITE_OTHER): Payer: Medicare HMO | Admitting: Family Medicine

## 2018-07-04 VITALS — BP 132/78 | HR 73 | Temp 97.6°F | Resp 16 | Ht 66.0 in | Wt 159.0 lb

## 2018-07-04 VITALS — BP 132/78 | HR 73 | Resp 16 | Ht 66.0 in | Wt 159.1 lb

## 2018-07-04 DIAGNOSIS — I10 Essential (primary) hypertension: Secondary | ICD-10-CM

## 2018-07-04 DIAGNOSIS — E7800 Pure hypercholesterolemia, unspecified: Secondary | ICD-10-CM

## 2018-07-04 DIAGNOSIS — Z0000 Encounter for general adult medical examination without abnormal findings: Secondary | ICD-10-CM | POA: Diagnosis not present

## 2018-07-04 DIAGNOSIS — E119 Type 2 diabetes mellitus without complications: Secondary | ICD-10-CM

## 2018-07-04 LAB — BASIC METABOLIC PANEL
BUN: 23 mg/dL (ref 6–23)
CHLORIDE: 99 meq/L (ref 96–112)
CO2: 36 mEq/L — ABNORMAL HIGH (ref 19–32)
Calcium: 10 mg/dL (ref 8.4–10.5)
Creatinine, Ser: 0.8 mg/dL (ref 0.40–1.20)
GFR: 72.96 mL/min (ref >60.00)
Glucose, Bld: 125 mg/dL — ABNORMAL HIGH (ref 70–99)
POTASSIUM: 4.1 meq/L (ref 3.5–5.1)
SODIUM: 140 meq/L (ref 135–145)

## 2018-07-04 LAB — HEMOGLOBIN A1C: Hgb A1c MFr Bld: 6.9 % — ABNORMAL HIGH (ref 4.6–6.5)

## 2018-07-04 NOTE — Progress Notes (Signed)
OFFICE VISIT  07/04/2018   CC:  Chief Complaint  Patient presents with  . Follow-up    rci   HPI:    Patient is a 84 y.o. Caucasian female who presents for 3 mo f/u DM 2, HTN, HLD.  No complaints.  HTN: home bp monitoring shows bp consistently <130/80. DM: no home glucose monitoring.  Still doing well with diet/exercise. HLD: tolerating daily statin.  ROS: no CP, no SOB, no wheezing, no cough, no dizziness, no HAs, no rashes, no melena/hematochezia.  No polyuria or polydipsia.  No myalgias or arthralgias.  Past Medical History:  Diagnosis Date  . Corneal dystrophies, hereditary   . Diabetes mellitus 10/2010   A1c 6.7%   . Diverticulosis   . Hyperlipemia   . Hypertension   . Hypothyroidism   . Lumbar spondylosis 05/2016   x-ray  . Osteoarthritis   . Transfusion history 1959   post partum    Past Surgical History:  Procedure Laterality Date  . APPENDECTOMY  1959  . CATARACT EXTRACTION, BILATERAL     Dr Bing Plume  . CESAREAN SECTION  1957; 21  . COLECTOMY     18 inches removed 2002-2003 for Diverticulitis  . COLONOSCOPY     Tics,Dr Sam Gilbert--pt declines any further screening as of 12/2015.  Marland Kitchen DEXA  06/30/2016   T score 2.5.  Repeat 3 yrs.  Marland Kitchen VAGINAL HYSTERECTOMY  1975   For Fibroids (no BSO)    Outpatient Medications Prior to Visit  Medication Sig Dispense Refill  . atorvastatin (LIPITOR) 40 MG tablet Take 1 tablet (40 mg total) by mouth daily. 90 tablet 1  . cycloSPORINE (RESTASIS) 0.05 % ophthalmic emulsion Place 1 drop into both eyes 2 (two) times daily.      . hydrochlorothiazide (HYDRODIURIL) 12.5 MG tablet TAKE 1 TABLET EVERY DAY IN PLACE OF METHYCLOTHIAZIDE 90 tablet 1  . levothyroxine (SYNTHROID, LEVOTHROID) 50 MCG tablet TAKE 1 TABLET EVERY DAY  EXCEPT TAKE 1/2 TABLET ON TUESDAY AND SATURDAY 78 tablet 1  . ramipril (ALTACE) 5 MG capsule TAKE 1 CAPSULE EVERY DAY 90 capsule 1  . aspirin 81 MG tablet Take 81 mg by mouth daily.      No  facility-administered medications prior to visit.     Allergies  Allergen Reactions  . Doxycycline Hyclate Diarrhea  . Penicillins     Rash Because of a history of documented adverse serious drug reaction;Medi Alert bracelet  is recommended  . Tetanus Toxoid     REACTION: Arm swelling-severe Because of a history of documented adverse serious drug reaction;Medi Alert bracelet  is recommended  . Metformin And Related Nausea And Vomiting and Other (See Comments)    Abdominal pain    ROS As per HPI  PE: Blood pressure 132/78, pulse 73, temperature 97.6 F (36.4 C), temperature source Oral, resp. rate 16, height 5\' 6"  (1.676 m), weight 159 lb (72.1 kg), SpO2 95 %. Gen: Alert, well appearing.  Patient is oriented to person, place, time, and situation. AFFECT: pleasant, lucid thought and speech. CV: RRR, no m/r/g.   LUNGS: CTA bilat, nonlabored resps, good aeration in all lung fields. EXT: no clubbing or cyanosis.  no edema.    LABS:  Lab Results  Component Value Date   TSH 0.59 12/26/2017   Lab Results  Component Value Date   WBC 5.1 12/24/2016   HGB 14.7 12/24/2016   HCT 44.0 12/24/2016   MCV 92.8 12/24/2016   PLT 208.0 12/24/2016   Lab Results  Component Value Date   CREATININE 0.77 12/26/2017   BUN 23 12/26/2017   NA 140 12/26/2017   K 4.5 12/26/2017   CL 100 12/26/2017   CO2 35 (H) 12/26/2017   Lab Results  Component Value Date   ALT 21 12/26/2017   AST 18 12/26/2017   ALKPHOS 76 12/26/2017   BILITOT 0.8 12/26/2017   Lab Results  Component Value Date   CHOL 169 12/26/2017   Lab Results  Component Value Date   HDL 58.80 12/26/2017   Lab Results  Component Value Date   LDLCALC 90 12/26/2017   Lab Results  Component Value Date   TRIG 100.0 12/26/2017   Lab Results  Component Value Date   CHOLHDL 3 12/26/2017   Lab Results  Component Value Date   HGBA1C 6.5 04/03/2018    IMPRESSION AND PLAN:  1) DM 2: well controlled with TLC. HbA1c  today.  2) HTN: The current medical regimen is effective;  continue present plan and medications. Lytes/cr today.  3) HLD: tolerating statin.  Lipids 12/2017 were good.  Plan repeat 12/2018.  An After Visit Summary was printed and given to the patient.  FOLLOW UP: Return in about 3 months (around 10/04/2018) for routine chronic illness f/u.  Signed:  Crissie Sickles, MD           07/04/2018

## 2018-07-04 NOTE — Patient Instructions (Signed)
You can stop taking your daily aspirin.

## 2018-07-04 NOTE — Progress Notes (Signed)
AWV reviewed and agree. Signed:  Crissie Sickles, MD           07/04/2018

## 2018-07-04 NOTE — Progress Notes (Signed)
Subjective:   Shannon Greene is a 84 y.o. female who presents for Medicare Annual (Subsequent) preventive examination.  Review of Systems:  No ROS.  Medicare Wellness Visit. Additional risk factors are reflected in the social history.  Cardiac Risk Factors include: advanced age (>55men, >67 women);diabetes mellitus;dyslipidemia;hypertension;family history of premature cardiovascular disease   Sleep patterns: Sleeps 7-8 hours, interrupted occasionally.  Home Safety/Smoke Alarms: Feels safe in home. Smoke alarms in place.  Living environment; residence and Firearm Safety: Lives with husband in 1 story home.  Seat Belt Safety/Bike Helmet: Wears seat belt.   Female:   Pap-N/A     Mammo-12/09/2017, BI-RADS CATEGORY  1: Negative.        Dexa scan-06/28/2016, normal. Plans to wait until mammogram 2020.           CCS-Colonoscopy > 10 years.      Objective:     Vitals: BP 132/78 (BP Location: Left Arm, Patient Position: Sitting, Cuff Size: Normal)   Pulse 73   Resp 16   Ht 5\' 6"  (1.676 m)   Wt 159 lb 2 oz (72.2 kg)   SpO2 95%   BMI 25.68 kg/m   Body mass index is 25.68 kg/m.  Advanced Directives 07/04/2018 06/24/2017  Does Patient Have a Medical Advance Directive? Yes Yes  Type of Paramedic of [place];Living will Living will;Healthcare Power of [place] in Chart? No - copy requested No - copy requested    Tobacco Social History   Tobacco Use  Smoking Status Never Smoker  Smokeless Tobacco Never Used     Counseling given: Not Answered   Past Medical History:  Diagnosis Date  . Corneal dystrophies, hereditary   . Diabetes mellitus 10/2010   A1c 6.7%   . Diverticulosis   . Hyperlipemia   . Hypertension   . Hypothyroidism   . Lumbar spondylosis 05/2016   x-ray  . Osteoarthritis   . Transfusion history 1959   post partum   Past Surgical History:  Procedure Laterality Date  . APPENDECTOMY  1959    . CATARACT EXTRACTION, BILATERAL     Dr Bing Plume  . CESAREAN SECTION  1957; 69  . COLECTOMY     18 inches removed 2002-2003 for Diverticulitis  . COLONOSCOPY     Tics,Dr Sam Beaver--pt declines any further screening as of 12/2015.  Marland Kitchen DEXA  06/30/2016   T score 2.5.  Repeat 3 yrs.  Marland Kitchen VAGINAL HYSTERECTOMY  1975   For Fibroids (no BSO)   Family History  Problem Relation Age of Onset  . Heart attack Mother 29  . Hypertension Mother   . Tuberculosis Father   . Diabetes Brother   . Stroke Brother 40  . Prostate cancer Brother    Social History   Socioeconomic History  . Marital status: Married    Spouse name: Not on file  . Number of children: Not on file  . Years of education: Not on file  . Highest education level: Not on file  Occupational History  . Not on file  Social Needs  . Financial resource strain: Not on file  . Food insecurity:    Worry: Not on file    Inability: Not on file  . Transportation needs:    Medical: Not on file    Non-medical: Not on file  Tobacco Use  . Smoking status: Never Smoker  . Smokeless tobacco: Never Used  Substance and Sexual Activity  . Alcohol  use: No  . Drug use: No  . Sexual activity: Not on file  Lifestyle  . Physical activity:    Days per week: 0 days    Minutes per session: 0 min  . Stress: Not on file  Relationships  . Social connections:    Talks on phone: Not on file    Gets together: Not on file    Attends religious service: Not on file    Active member of club or organization: Not on file    Attends meetings of clubs or organizations: Not on file    Relationship status: Not on file  Other Topics Concern  . Not on file  Social History Narrative   Married, 2 daughters.   Occupation: retired Facilities manager).   Silver sneakers.   No T/A/Ds.    Outpatient Encounter Medications as of 07/04/2018  Medication Sig  . aspirin 81 MG tablet Take 81 mg by mouth daily.   Marland Kitchen atorvastatin (LIPITOR) 40 MG tablet  Take 1 tablet (40 mg total) by mouth daily.  . cycloSPORINE (RESTASIS) 0.05 % ophthalmic emulsion Place 1 drop into both eyes 2 (two) times daily.    . hydrochlorothiazide (HYDRODIURIL) 12.5 MG tablet TAKE 1 TABLET EVERY DAY IN PLACE OF METHYCLOTHIAZIDE  . levothyroxine (SYNTHROID, LEVOTHROID) 50 MCG tablet TAKE 1 TABLET EVERY DAY  EXCEPT TAKE 1/2 TABLET ON TUESDAY AND SATURDAY  . ramipril (ALTACE) 5 MG capsule TAKE 1 CAPSULE EVERY DAY   No facility-administered encounter medications on file as of 07/04/2018.     Activities of Daily Living In your present state of health, do you have any difficulty performing the following activities: 07/04/2018  Hearing? N  Vision? N  Difficulty concentrating or making decisions? N  Walking or climbing stairs? N  Dressing or bathing? N  Doing errands, shopping? N  Preparing Food and eating ? N  Using the Toilet? N  In the past six months, have you accidently leaked urine? N  Do you have problems with loss of bowel control? N  Managing your Medications? N  Managing your Finances? N  Housekeeping or managing your Housekeeping? N  Some recent data might be hidden    Patient Care Team: Tammi Sou, MD as PCP - General (Family Medicine) Calvert Cantor, MD as Consulting Physician (Ophthalmology)    Assessment:   This is a routine wellness examination for Shannon Greene.  Exercise Activities and Dietary recommendations Exercise limited by: None identified   Diet (meal preparation, eat out, water intake, caffeinated beverages, dairy products, fruits and vegetables): Drinks gatorade, tea and small amts of water.   Breakfast: toast, boiled egg, hot tea Lunch: salad; lunch meat/cheese Dinner: protein and vegetables.   Goals      Patient Stated   . <enter goal here> (pt-stated)     Maintain current health.       Other   . Patient Stated     Maintain weight by staying active and controlling carb intake.        Fall Risk Fall Risk  07/04/2018  06/24/2017 06/19/2016 12/16/2015 12/09/2014  Falls in the past year? No No No No No    Depression Screen PHQ 2/9 Scores 07/04/2018 06/24/2017 06/19/2016 12/16/2015  PHQ - 2 Score 0 0 0 0  Exception Documentation - - - -     Cognitive Function MMSE - Mini Mental State Exam 07/04/2018  Orientation to time 5  Orientation to Place 5  Registration 3  Attention/ Calculation 5  Recall 3  Language- name 2 objects 2  Language- repeat 1  Language- follow 3 step command 3  Language- read & follow direction 1  Write a sentence 1  Copy design 1  Total score 30        Immunization History  Administered Date(s) Administered  . Influenza Whole 07/06/2012  . Influenza, High Dose Seasonal PF 07/19/2014, 07/28/2015, 08/05/2017  . Influenza,inj,Quad PF,6+ Mos 07/31/2016  . Pneumococcal Conjugate-13 12/16/2015  . Pneumococcal-Unspecified 11/05/2001  . Td 12/24/2016  . Zoster 06/21/2016  . Zoster Recombinat (Shingrix) 03/29/2017, 08/12/2017     Screening Tests Health Maintenance  Topic Date Due  . INFLUENZA VACCINE  06/05/2018  . FOOT EXAM  06/24/2018  . HEMOGLOBIN A1C  10/04/2018  . OPHTHALMOLOGY EXAM  01/10/2019  . DEXA SCAN  06/29/2019  . PNA vac Low Risk Adult  Completed   Diabetic Foot Exam - Simple   Simple Foot Form Diabetic Foot exam was performed with the following findings:  Yes 07/04/2018  9:23 AM  Visual Inspection No deformities, no ulcerations, no other skin breakdown bilaterally:  Yes Sensation Testing Intact to touch and monofilament testing bilaterally:  Yes Pulse Check Posterior Tibialis and Dorsalis pulse intact bilaterally:  Yes Comments         Plan:     Flu Vaccine on 07/15/18 @ 1030.   Continue doing brain stimulating activities (puzzles, reading, adult coloring books, staying active) to keep memory sharp.   Bring a copy of your living will and/or healthcare power of attorney to your next office visit.  I have personally reviewed and noted the following  in the patient's chart:   . Medical and social history . Use of alcohol, tobacco or illicit drugs  . Current medications and supplements . Functional ability and status . Nutritional status . Physical activity . Advanced directives . List of other physicians . Hospitalizations, surgeries, and ER visits in previous 12 months . Vitals . Screenings to include cognitive, depression, and falls . Referrals and appointments  In addition, I have reviewed and discussed with patient certain preventive protocols, quality metrics, and best practice recommendations. A written personalized care plan for preventive services as well as general preventive health recommendations were provided to patient.     Shannon Nestle, RN  07/04/2018

## 2018-07-04 NOTE — Patient Instructions (Addendum)
Flu Vaccine on 07/15/18 @ 1030.   Continue doing brain stimulating activities (puzzles, reading, adult coloring books, staying active) to keep memory sharp.   Bring a copy of your living will and/or healthcare power of attorney to your next office visit.  Health Maintenance, Female Adopting a healthy lifestyle and getting preventive care can go a long way to promote health and wellness. Talk with your health care provider about what schedule of regular examinations is right for you. This is a good chance for you to check in with your provider about disease prevention and staying healthy. In between checkups, there are plenty of things you can do on your own. Experts have done a lot of research about which lifestyle changes and preventive measures are most likely to keep you healthy. Ask your health care provider for more information. Weight and diet Eat a healthy diet  Be sure to include plenty of vegetables, fruits, low-fat dairy products, and lean protein.  Do not eat a lot of foods high in solid fats, added sugars, or salt.  Get regular exercise. This is one of the most important things you can do for your health. ? Most adults should exercise for at least 150 minutes each week. The exercise should increase your heart rate and make you sweat (moderate-intensity exercise). ? Most adults should also do strengthening exercises at least twice a week. This is in addition to the moderate-intensity exercise.  Maintain a healthy weight  Body mass index (BMI) is a measurement that can be used to identify possible weight problems. It estimates body fat based on height and weight. Your health care provider can help determine your BMI and help you achieve or maintain a healthy weight.  For females 40 years of age and older: ? A BMI below 18.5 is considered underweight. ? A BMI of 18.5 to 24.9 is normal. ? A BMI of 25 to 29.9 is considered overweight. ? A BMI of 30 and above is considered  obese.  Watch levels of cholesterol and blood lipids  You should start having your blood tested for lipids and cholesterol at 82 years of age, then have this test every 5 years.  You may need to have your cholesterol levels checked more often if: ? Your lipid or cholesterol levels are high. ? You are older than 82 years of age. ? You are at high risk for heart disease.  Cancer screening Lung Cancer  Lung cancer screening is recommended for adults 8-64 years old who are at high risk for lung cancer because of a history of smoking.  A yearly low-dose CT scan of the lungs is recommended for people who: ? Currently smoke. ? Have quit within the past 15 years. ? Have at least a 30-pack-year history of smoking. A pack year is smoking an average of one pack of cigarettes a day for 1 year.  Yearly screening should continue until it has been 15 years since you quit.  Yearly screening should stop if you develop a health problem that would prevent you from having lung cancer treatment.  Breast Cancer  Practice breast self-awareness. This means understanding how your breasts normally appear and feel.  It also means doing regular breast self-exams. Let your health care provider know about any changes, no matter how small.  If you are in your 20s or 30s, you should have a clinical breast exam (CBE) by a health care provider every 1-3 years as part of a regular health exam.  If you  are 40 or older, have a CBE every year. Also consider having a breast X-ray (mammogram) every year.  If you have a family history of breast cancer, talk to your health care provider about genetic screening.  If you are at high risk for breast cancer, talk to your health care provider about having an MRI and a mammogram every year.  Breast cancer gene (BRCA) assessment is recommended for women who have family members with BRCA-related cancers. BRCA-related cancers  include: ? Breast. ? Ovarian. ? Tubal. ? Peritoneal cancers.  Results of the assessment will determine the need for genetic counseling and BRCA1 and BRCA2 testing.  Cervical Cancer Your health care provider may recommend that you be screened regularly for cancer of the pelvic organs (ovaries, uterus, and vagina). This screening involves a pelvic examination, including checking for microscopic changes to the surface of your cervix (Pap test). You may be encouraged to have this screening done every 3 years, beginning at age 21.  For women ages 30-65, health care providers may recommend pelvic exams and Pap testing every 3 years, or they may recommend the Pap and pelvic exam, combined with testing for human papilloma virus (HPV), every 5 years. Some types of HPV increase your risk of cervical cancer. Testing for HPV may also be done on women of any age with unclear Pap test results.  Other health care providers may not recommend any screening for nonpregnant women who are considered low risk for pelvic cancer and who do not have symptoms. Ask your health care provider if a screening pelvic exam is right for you.  If you have had past treatment for cervical cancer or a condition that could lead to cancer, you need Pap tests and screening for cancer for at least 20 years after your treatment. If Pap tests have been discontinued, your risk factors (such as having a new sexual partner) need to be reassessed to determine if screening should resume. Some women have medical problems that increase the chance of getting cervical cancer. In these cases, your health care provider may recommend more frequent screening and Pap tests.  Colorectal Cancer  This type of cancer can be detected and often prevented.  Routine colorectal cancer screening usually begins at 82 years of age and continues through 82 years of age.  Your health care provider may recommend screening at an earlier age if you have risk factors  for colon cancer.  Your health care provider may also recommend using home test kits to check for hidden blood in the stool.  A small camera at the end of a tube can be used to examine your colon directly (sigmoidoscopy or colonoscopy). This is done to check for the earliest forms of colorectal cancer.  Routine screening usually begins at age 50.  Direct examination of the colon should be repeated every 5-10 years through 82 years of age. However, you may need to be screened more often if early forms of precancerous polyps or small growths are found.  Skin Cancer  Check your skin from head to toe regularly.  Tell your health care provider about any new moles or changes in moles, especially if there is a change in a mole's shape or color.  Also tell your health care provider if you have a mole that is larger than the size of a pencil eraser.  Always use sunscreen. Apply sunscreen liberally and repeatedly throughout the day.  Protect yourself by wearing long sleeves, pants, a wide-brimmed hat, and sunglasses whenever   you are outside.  Heart disease, diabetes, and high blood pressure  High blood pressure causes heart disease and increases the risk of stroke. High blood pressure is more likely to develop in: ? People who have blood pressure in the high end of the normal range (130-139/85-89 mm Hg). ? People who are overweight or obese. ? People who are African American.  If you are 17-68 years of age, have your blood pressure checked every 3-5 years. If you are 76 years of age or older, have your blood pressure checked every year. You should have your blood pressure measured twice-once when you are at a hospital or clinic, and once when you are not at a hospital or clinic. Record the average of the two measurements. To check your blood pressure when you are not at a hospital or clinic, you can use: ? An automated blood pressure machine at a pharmacy. ? A home blood pressure monitor.  If  you are between 40 years and 61 years old, ask your health care provider if you should take aspirin to prevent strokes.  Have regular diabetes screenings. This involves taking a blood sample to check your fasting blood sugar level. ? If you are at a normal weight and have a low risk for diabetes, have this test once every three years after 82 years of age. ? If you are overweight and have a high risk for diabetes, consider being tested at a younger age or more often. Preventing infection Hepatitis B  If you have a higher risk for hepatitis B, you should be screened for this virus. You are considered at high risk for hepatitis B if: ? You were born in a country where hepatitis B is common. Ask your health care provider which countries are considered high risk. ? Your parents were born in a high-risk country, and you have not been immunized against hepatitis B (hepatitis B vaccine). ? You have HIV or AIDS. ? You use needles to inject street drugs. ? You live with someone who has hepatitis B. ? You have had sex with someone who has hepatitis B. ? You get hemodialysis treatment. ? You take certain medicines for conditions, including cancer, organ transplantation, and autoimmune conditions.  Hepatitis C  Blood testing is recommended for: ? Everyone born from [birth year] through 1963. ? Anyone with known risk factors for hepatitis C.  Sexually transmitted infections (STIs)  You should be screened for sexually transmitted infections (STIs) including gonorrhea and chlamydia if: ? You are sexually active and are younger than 82 years of age. ? You are older than 82 years of age and your health care provider tells you that you are at risk for this type of infection. ? Your sexual activity has changed since you were last screened and you are at an increased risk for chlamydia or gonorrhea. Ask your health care provider if you are at risk.  If you do not have HIV, but are at risk, it may be recommended  that you take a prescription medicine daily to prevent HIV infection. This is called pre-exposure prophylaxis (PrEP). You are considered at risk if: ? You are sexually active and do not regularly use condoms or know the HIV status of your partner(s). ? You take drugs by injection. ? You are sexually active with a partner who has HIV.  Talk with your health care provider about whether you are at high risk of being infected with HIV. If you choose to begin PrEP, you should first  be tested for HIV. You should then be tested every 3 months for as long as you are taking PrEP. Pregnancy  If you are premenopausal and you may become pregnant, ask your health care provider about preconception counseling.  If you may become pregnant, take 400 to 800 micrograms (mcg) of folic acid every day.  If you want to prevent pregnancy, talk to your health care provider about birth control (contraception). Osteoporosis and menopause  Osteoporosis is a disease in which the bones lose minerals and strength with aging. This can result in serious bone fractures. Your risk for osteoporosis can be identified using a bone density scan.  If you are 65 years of age or older, or if you are at risk for osteoporosis and fractures, ask your health care provider if you should be screened.  Ask your health care provider whether you should take a calcium or vitamin D supplement to lower your risk for osteoporosis.  Menopause may have certain physical symptoms and risks.  Hormone replacement therapy may reduce some of these symptoms and risks. Talk to your health care provider about whether hormone replacement therapy is right for you. Follow these instructions at home:  Schedule regular health, dental, and eye exams.  Stay current with your immunizations.  Do not use any tobacco products including cigarettes, chewing tobacco, or electronic cigarettes.  If you are pregnant, do not drink alcohol.  If you are  breastfeeding, limit how much and how often you drink alcohol.  Limit alcohol intake to no more than 1 drink per day for nonpregnant women. One drink equals 12 ounces of beer, 5 ounces of wine, or 1 ounces of hard liquor.  Do not use street drugs.  Do not share needles.  Ask your health care provider for help if you need support or information about quitting drugs.  Tell your health care provider if you often feel depressed.  Tell your health care provider if you have ever been abused or do not feel safe at home. This information is not intended to replace advice given to you by your health care provider. Make sure you discuss any questions you have with your health care provider. Document Released: 05/07/2011 Document Revised: 03/29/2016 Document Reviewed: 07/26/2015 Elsevier Interactive Patient Education  2018 [employer]  

## 2018-07-05 DIAGNOSIS — H353132 Nonexudative age-related macular degeneration, bilateral, intermediate dry stage: Secondary | ICD-10-CM | POA: Diagnosis not present

## 2018-07-15 ENCOUNTER — Ambulatory Visit (INDEPENDENT_AMBULATORY_CARE_PROVIDER_SITE_OTHER): Payer: Medicare HMO

## 2018-07-15 DIAGNOSIS — Z23 Encounter for immunization: Secondary | ICD-10-CM

## 2018-08-02 DIAGNOSIS — H353132 Nonexudative age-related macular degeneration, bilateral, intermediate dry stage: Secondary | ICD-10-CM | POA: Diagnosis not present

## 2018-08-05 ENCOUNTER — Other Ambulatory Visit: Payer: Self-pay | Admitting: Family Medicine

## 2018-10-04 ENCOUNTER — Other Ambulatory Visit: Payer: Self-pay | Admitting: Family Medicine

## 2018-10-07 ENCOUNTER — Ambulatory Visit: Payer: Medicare HMO | Admitting: Family Medicine

## 2018-10-08 DIAGNOSIS — H353132 Nonexudative age-related macular degeneration, bilateral, intermediate dry stage: Secondary | ICD-10-CM | POA: Diagnosis not present

## 2018-10-16 ENCOUNTER — Ambulatory Visit (INDEPENDENT_AMBULATORY_CARE_PROVIDER_SITE_OTHER): Payer: Medicare HMO | Admitting: Family Medicine

## 2018-10-16 ENCOUNTER — Encounter: Payer: Self-pay | Admitting: Family Medicine

## 2018-10-16 VITALS — BP 131/82 | HR 74 | Temp 97.4°F | Resp 16 | Ht 66.0 in | Wt 160.0 lb

## 2018-10-16 DIAGNOSIS — I10 Essential (primary) hypertension: Secondary | ICD-10-CM

## 2018-10-16 DIAGNOSIS — E119 Type 2 diabetes mellitus without complications: Secondary | ICD-10-CM | POA: Diagnosis not present

## 2018-10-16 DIAGNOSIS — E7800 Pure hypercholesterolemia, unspecified: Secondary | ICD-10-CM | POA: Diagnosis not present

## 2018-10-16 LAB — POCT GLYCOSYLATED HEMOGLOBIN (HGB A1C): Hemoglobin A1C: 6.7 % — AB (ref 4.0–5.6)

## 2018-10-16 NOTE — Progress Notes (Signed)
OFFICE VISIT  10/16/2018   CC:  Chief Complaint  Patient presents with  . Follow-up    [name]     HPI:    Patient is a 84 y.o. Caucasian female who presents for 3 mo f/u DM 2, HTN, HLD. She was all stable last f/u, no changes made.  DM: no home gluc monitoring.  Still active.  Trying to eat well despite it being the holidays.  HTN: home bp 120s/70s all the time.  HLD: tolerating statin.  ROS: no CP, no SOB, no wheezing, no cough, no dizziness, no HAs, no rashes, no melena/hematochezia.  No polyuria or polydipsia.  No myalgias or arthralgias.  Past Medical History:  Diagnosis Date  . Corneal dystrophies, hereditary   . Diabetes mellitus 10/2010   A1c 6.7%   . Diverticulosis   . Hyperlipemia   . Hypertension   . Hypothyroidism   . Lumbar spondylosis 05/2016   x-ray  . Osteoarthritis   . Transfusion history 1959   post partum    Past Surgical History:  Procedure Laterality Date  . APPENDECTOMY  1959  . CATARACT EXTRACTION, BILATERAL     Dr Bing Plume  . CESAREAN SECTION  1957; 74  . COLECTOMY     18 inches removed 2002-2003 for Diverticulitis  . COLONOSCOPY     Tics,Dr Sam [place]--pt declines any further screening as of 12/2015.  Marland Kitchen DEXA  06/30/2016   T score 2.5.  Repeat 3 yrs.  Marland Kitchen VAGINAL HYSTERECTOMY  1975   For Fibroids (no BSO)    Outpatient Medications Prior to Visit  Medication Sig Dispense Refill  . atorvastatin (LIPITOR) 40 MG tablet TAKE 1 TABLET (40 MG TOTAL) BY MOUTH DAILY. 90 tablet 1  . cycloSPORINE (RESTASIS) 0.05 % ophthalmic emulsion Place 1 drop into both eyes 2 (two) times daily.      . hydrochlorothiazide (HYDRODIURIL) 12.5 MG tablet TAKE 1 TABLET EVERY DAY IN PLACE OF METHYCLOTHIAZIDE 90 tablet 1  . levothyroxine (SYNTHROID, LEVOTHROID) 50 MCG tablet TAKE 1 TABLET EVERY DAY  EXCEPT TAKE 1/2 TABLET ON TUESDAY AND SATURDAY 78 tablet 1  . ramipril (ALTACE) 5 MG capsule TAKE 1 CAPSULE EVERY DAY 90 capsule 0   No facility-administered medications  prior to visit.     Allergies  Allergen Reactions  . Doxycycline Hyclate Diarrhea  . Penicillins     Rash Because of a history of documented adverse serious drug reaction;Medi Alert bracelet  is recommended  . Tetanus Toxoid     REACTION: Arm swelling-severe Because of a history of documented adverse serious drug reaction;Medi Alert bracelet  is recommended  . Metformin And Related Nausea And Vomiting and Other (See Comments)    Abdominal pain    ROS As per HPI  PE: Blood pressure 131/82, pulse 74, temperature (!) 97.4 F (36.3 C), temperature source Oral, resp. rate 16, height 5\' 6"  (1.676 m), weight 160 lb (72.6 kg), SpO2 96 %. Body mass index is 25.82 kg/m.  Gen: Alert, well appearing.  Patient is oriented to person, place, time, and situation. AFFECT: pleasant, lucid thought and speech. CV: RRR, no m/r/g.   LUNGS: CTA bilat, nonlabored resps, good aeration in all lung fields. EXT: no clubbing or cyanosis.  no edema.    LABS:  Lab Results  Component Value Date   TSH 0.59 12/26/2017   Lab Results  Component Value Date   WBC 5.1 12/24/2016   HGB 14.7 12/24/2016   HCT 44.0 12/24/2016   MCV 92.8 12/24/2016  PLT 208.0 12/24/2016   Lab Results  Component Value Date   CREATININE 0.80 07/04/2018   BUN 23 07/04/2018   NA 140 07/04/2018   K 4.1 07/04/2018   CL 99 07/04/2018   CO2 36 (H) 07/04/2018   Lab Results  Component Value Date   ALT 21 12/26/2017   AST 18 12/26/2017   ALKPHOS 76 12/26/2017   BILITOT 0.8 12/26/2017   Lab Results  Component Value Date   CHOL 169 12/26/2017   Lab Results  Component Value Date   HDL 58.80 12/26/2017   Lab Results  Component Value Date   LDLCALC 90 12/26/2017   Lab Results  Component Value Date   TRIG 100.0 12/26/2017   Lab Results  Component Value Date   CHOLHDL 3 12/26/2017   Lab Results  Component Value Date   HGBA1C 6.7 (A) 10/16/2018   POC HbA1c today 6.7%  IMPRESSION AND PLAN:  1) DM 2, diet  controlled. Hba1c today= 6.7%. No new recommendations.  2) HTN: The current medical regimen is effective;  continue present plan and medications. Lytes/cr normal < 4 mo ago.  Will recheck these at next f/u visit in 3 months.  3) HLD: tolerating statin.  Living healthy lifestyle. Great lipid panel 12/2017: plan repeat FLP at next f/u in 3 months.  An After Visit Summary was printed and given to the patient.  FOLLOW UP: Return in about 3 months (around 01/15/2019) for annual CPE (fasting).  Signed:  Crissie Sickles, MD           10/16/2018

## 2018-11-03 ENCOUNTER — Other Ambulatory Visit: Payer: Self-pay | Admitting: Family Medicine

## 2018-11-04 ENCOUNTER — Encounter: Payer: Self-pay | Admitting: Family Medicine

## 2018-11-04 ENCOUNTER — Encounter

## 2018-11-04 ENCOUNTER — Ambulatory Visit (INDEPENDENT_AMBULATORY_CARE_PROVIDER_SITE_OTHER): Payer: Medicare HMO | Admitting: Family Medicine

## 2018-11-04 VITALS — BP 126/68 | HR 80 | Temp 98.1°F | Resp 16 | Ht 66.0 in | Wt 161.1 lb

## 2018-11-04 DIAGNOSIS — J069 Acute upper respiratory infection, unspecified: Secondary | ICD-10-CM

## 2018-11-04 DIAGNOSIS — J209 Acute bronchitis, unspecified: Secondary | ICD-10-CM | POA: Diagnosis not present

## 2018-11-04 DIAGNOSIS — B9789 Other viral agents as the cause of diseases classified elsewhere: Secondary | ICD-10-CM | POA: Diagnosis not present

## 2018-11-04 MED ORDER — PREDNISONE 20 MG PO TABS: ORAL_TABLET | ORAL | 0 refills | Status: DC

## 2018-11-04 NOTE — Patient Instructions (Addendum)
Get otc generic robitussin DM OR Mucinex DM and use as directed on the packaging for cough and congestion. Use otc generic saline nasal spray 2-3 times per day to irrigate/moisturize your nasal passages.  Use a cool mist humidifier in your bedroom.

## 2018-11-04 NOTE — Progress Notes (Signed)
OFFICE VISIT  11/04/2018   CC:  Chief Complaint  Patient presents with  . Cough    with congestion   HPI:    Patient is a 84 y.o. Caucasian female who presents for cough. Onset 5 d/a nasal congestion, cough.  No ST. HA only when she coughs hard for a while. No fever. Had some leftover codeine cough syrup and this caused excessive sedation. Occ wheeze and feeling of SOB.   Eating and drinking fair. No n/v/d.  No body aches.   Past Medical History:  Diagnosis Date  . Corneal dystrophies, hereditary   . Diabetes mellitus 10/2010   A1c 6.7%   . Diverticulosis   . Hyperlipemia   . Hypertension   . Hypothyroidism   . Lumbar spondylosis 05/2016   x-ray  . Osteoarthritis   . Transfusion history 1959   post partum    Past Surgical History:  Procedure Laterality Date  . APPENDECTOMY  1959  . CATARACT EXTRACTION, BILATERAL     Dr Bing Plume  . CESAREAN SECTION  1957; 11  . COLECTOMY     18 inches removed 2002-2003 for Diverticulitis  . COLONOSCOPY     Tics,Dr Sam [place]--pt declines any further screening as of 12/2015.  Marland Kitchen DEXA  06/30/2016   T score 2.5.  Repeat 3 yrs.  Marland Kitchen VAGINAL HYSTERECTOMY  1975   For Fibroids (no BSO)    Outpatient Medications Prior to Visit  Medication Sig Dispense Refill  . atorvastatin (LIPITOR) 40 MG tablet TAKE 1 TABLET (40 MG TOTAL) BY MOUTH DAILY. 90 tablet 1  . cycloSPORINE (RESTASIS) 0.05 % ophthalmic emulsion Place 1 drop into both eyes 2 (two) times daily.      . hydrochlorothiazide (HYDRODIURIL) 12.5 MG tablet TAKE 1 TABLET EVERY DAY IN PLACE OF METHYCLOTHIAZIDE 90 tablet 1  . levothyroxine (SYNTHROID, LEVOTHROID) 50 MCG tablet TAKE 1 TABLET EVERY DAY  EXCEPT TAKE 1/2 TABLET ON TUESDAY AND SATURDAY 78 tablet 1  . ramipril (ALTACE) 5 MG capsule TAKE 1 CAPSULE EVERY DAY 90 capsule 0   No facility-administered medications prior to visit.     Allergies  Allergen Reactions  . Doxycycline Hyclate Diarrhea  . Penicillins     Rash Because  of a history of documented adverse serious drug reaction;Medi Alert bracelet  is recommended  . Tetanus Toxoid     REACTION: Arm swelling-severe Because of a history of documented adverse serious drug reaction;Medi Alert bracelet  is recommended  . Metformin And Related Nausea And Vomiting and Other (See Comments)    Abdominal pain    ROS As per HPI  PE: Blood pressure 126/68, pulse 80, temperature 98.1 F (36.7 C), temperature source Oral, resp. rate 16, height 5\' 6"  (1.676 m), weight 161 lb 2 oz (73.1 kg), SpO2 94 %. VS: noted--normal. Gen: alert, NAD, NONTOXIC APPEARING. HEENT: eyes without injection, drainage, or swelling.  Ears: EACs clear, TMs with normal light reflex and landmarks.  Nose: Clear rhinorrhea, with some dried, crusty exudate adherent to mildly injected mucosa.  No purulent d/c.  No paranasal sinus TTP.  No facial swelling.  Throat and mouth without focal lesion.  No pharyngial swelling, erythema, or exudate.   Neck: supple, no LAD.   LUNGS: faint insp/exp wheezes that clear with cough, nonlabored resps.  Lots of post-exhalation coughing.  Aeration is good.   CV: RRR, no m/r/g. EXT: no c/c/e SKIN: no rash   LABS:    Chemistry      Component Value Date/Time  NA 140 07/04/2018 0955   K 4.1 07/04/2018 0955   CL 99 07/04/2018 0955   CO2 36 (H) 07/04/2018 0955   BUN 23 07/04/2018 0955   CREATININE 0.80 07/04/2018 0955      Component Value Date/Time   CALCIUM 10.0 07/04/2018 0955   ALKPHOS 76 12/26/2017 0905   AST 18 12/26/2017 0905   ALT 21 12/26/2017 0905   BILITOT 0.8 12/26/2017 0905     Lab Results  Component Value Date   HGBA1C 6.7 (A) 10/16/2018    IMPRESSION AND PLAN:  Acute URI with acute bronchitis. Prednisone 40mg  qd x 5d. She declined trial of albuterol HFA. Get otc generic robitussin DM OR Mucinex DM and use as directed on the packaging for cough and congestion. Use otc generic saline nasal spray 2-3 times per day to irrigate/moisturize  your nasal passages.  An After Visit Summary was printed and given to the patient.  FOLLOW UP: Return if symptoms worsen or fail to improve.  Signed:  Crissie Sickles, MD           11/04/2018

## 2018-11-07 DIAGNOSIS — H353132 Nonexudative age-related macular degeneration, bilateral, intermediate dry stage: Secondary | ICD-10-CM | POA: Diagnosis not present

## 2018-11-10 ENCOUNTER — Ambulatory Visit (INDEPENDENT_AMBULATORY_CARE_PROVIDER_SITE_OTHER): Payer: Medicare HMO | Admitting: Family Medicine

## 2018-11-10 ENCOUNTER — Encounter: Payer: Self-pay | Admitting: Family Medicine

## 2018-11-10 VITALS — BP 128/78 | HR 83 | Temp 97.8°F | Resp 16 | Ht 66.0 in | Wt 160.5 lb

## 2018-11-10 DIAGNOSIS — J209 Acute bronchitis, unspecified: Secondary | ICD-10-CM | POA: Diagnosis not present

## 2018-11-10 DIAGNOSIS — J069 Acute upper respiratory infection, unspecified: Secondary | ICD-10-CM

## 2018-11-10 MED ORDER — LEVOFLOXACIN 500 MG PO TABS: 500.0000 mg | ORAL_TABLET | Freq: Every day | ORAL | 0 refills | Status: DC

## 2018-11-10 MED ORDER — PREDNISONE 20 MG PO TABS: ORAL_TABLET | ORAL | 0 refills | Status: DC

## 2018-11-10 NOTE — Patient Instructions (Signed)
Continue generic robitussin DM OR Mucinex DM and use as directed on the packaging for cough and congestion. Use otc generic saline nasal spray 2-3 times per day to irrigate/moisturize your nasal passages.

## 2018-11-10 NOTE — Progress Notes (Signed)
OFFICE VISIT  11/10/2018   CC:  Chief Complaint  Patient presents with  . Cough    productive and has some SHOB   HPI:    Patient is a 85 y.o. Caucasian female who presents for ongoing cough. I saw her 7 d/a: Acute URI with acute bronchitis (she had been sick about 5d at that time). Plan at that time:  Prednisone 40mg  qd x 5d. She declined trial of albuterol HFA.  Interim Hx: Cough is the same and she has a lot of green/thick mucous that comes up. Swallowing this is making her feel nauseous.  No vomiting. Nasal cong/runny nose still but this mucous is clear and upper resp sx's don't seem as bad.  No face pain or HA. No fever.  She took all her prednisone (5d) as rx'd. No chest pain.  Her feeling of SOB is due to not being able to breath through her nose.   Coughs very easily on inhalation phase of a deep breath.  No wheezing. No malaise or achiness. Not taking any meds at this time.  Past Medical History:  Diagnosis Date  . Corneal dystrophies, hereditary   . Diabetes mellitus 10/2010   A1c 6.7%   . Diverticulosis   . Hyperlipemia   . Hypertension   . Hypothyroidism   . Lumbar spondylosis 05/2016   x-ray  . Osteoarthritis   . Transfusion history 1959   post partum    Past Surgical History:  Procedure Laterality Date  . APPENDECTOMY  1959  . CATARACT EXTRACTION, BILATERAL     Dr Bing Plume  . CESAREAN SECTION  1957; 55  . COLECTOMY     18 inches removed 2002-2003 for Diverticulitis  . COLONOSCOPY     Tics,Dr Sam [place]--pt declines any further screening as of 12/2015.  Marland Kitchen DEXA  06/30/2016   T score 2.5.  Repeat 3 yrs.  Marland Kitchen VAGINAL HYSTERECTOMY  1975   For Fibroids (no BSO)    Outpatient Medications Prior to Visit  Medication Sig Dispense Refill  . atorvastatin (LIPITOR) 40 MG tablet TAKE 1 TABLET (40 MG TOTAL) BY MOUTH DAILY. 90 tablet 1  . cycloSPORINE (RESTASIS) 0.05 % ophthalmic emulsion Place 1 drop into both eyes 2 (two) times daily.      .  hydrochlorothiazide (HYDRODIURIL) 12.5 MG tablet TAKE 1 TABLET EVERY DAY IN PLACE OF METHYCLOTHIAZIDE 90 tablet 1  . levothyroxine (SYNTHROID, LEVOTHROID) 50 MCG tablet TAKE 1 TABLET EVERY DAY  EXCEPT TAKE 1/2 TABLET ON TUESDAY AND SATURDAY 78 tablet 1  . ramipril (ALTACE) 5 MG capsule TAKE 1 CAPSULE EVERY DAY 90 capsule 0  . predniSONE (DELTASONE) 20 MG tablet 2 tabs po qd x 5d (Patient not taking: Reported on 11/10/2018) 10 tablet 0   No facility-administered medications prior to visit.     Allergies  Allergen Reactions  . Doxycycline Hyclate Diarrhea  . Penicillins     Rash Because of a history of documented adverse serious drug reaction;Medi Alert bracelet  is recommended  . Tetanus Toxoid     REACTION: Arm swelling-severe Because of a history of documented adverse serious drug reaction;Medi Alert bracelet  is recommended  . Metformin And Related Nausea And Vomiting and Other (See Comments)    Abdominal pain    ROS As per HPI  PE: Blood pressure 128/78, pulse 83, temperature 97.8 F (36.6 C), temperature source Oral, resp. rate 16, height 5\' 6"  (1.676 m), weight 160 lb 8 oz (72.8 kg), SpO2 94 %. VS: noted--normal. Gen:  alert, NAD, well- APPEARING.  Pleasant and smiling/jocular as per her usual. HEENT: eyes without injection, drainage, or swelling.  Ears: EACs clear, TMs with normal light reflex and landmarks.  Nose: Clear rhinorrhea, with some dried, crusty exudate adherent to mildly injected mucosa.  No purulent d/c.  No paranasal sinus TTP.  No facial swelling.  Throat and mouth without focal lesion.  No pharyngial swelling, erythema, or exudate.   Neck: supple, no LAD.   LUNGS: CTA bilat, with symmetric aeration on inspiration, nonlabored resps.  In expiration she has some coarse rhonchi that clear with cough.  No prolongation of exp phase.  She coughs at the end of exhalation occasionally. CV: RRR, no m/r/g. EXT: no c/c/e SKIN: no rash    LABS:    Chemistry       Component Value Date/Time   NA 140 07/04/2018 0955   K 4.1 07/04/2018 0955   CL 99 07/04/2018 0955   CO2 36 (H) 07/04/2018 0955   BUN 23 07/04/2018 0955   CREATININE 0.80 07/04/2018 0955      Component Value Date/Time   CALCIUM 10.0 07/04/2018 0955   ALKPHOS 76 12/26/2017 0905   AST 18 12/26/2017 0905   ALT 21 12/26/2017 0905   BILITOT 0.8 12/26/2017 0905      IMPRESSION AND PLAN:  Acute bronchitis, possibly bacterial. Plan: prednisone 40mg  qd x 5d, then 20mg  qd x 5d.  She declined albuterol inhaler again. Due to multiple antibiotic allergies AND pt report that "Zpack never does anything for me", I rx'd levaquin 500mg  qd x 7d. She'll continue generic robitussin DM OR Mucinex DM and use as directed on the packaging for cough and congestion. Encouraged pt to add otc generic saline nasal spray 2-3 times per day to irrigate/moisturize your nasal passages. Signs/symptoms to call or return for were reviewed and pt expressed understanding.  An After Visit Summary was printed and given to the patient.  FOLLOW UP: Return if symptoms worsen or fail to improve.  Signed:  Crissie Sickles, MD           11/10/2018

## 2018-11-19 ENCOUNTER — Encounter: Payer: Self-pay | Admitting: Family Medicine

## 2018-11-19 ENCOUNTER — Ambulatory Visit (INDEPENDENT_AMBULATORY_CARE_PROVIDER_SITE_OTHER): Payer: Medicare HMO | Admitting: Family Medicine

## 2018-11-19 VITALS — BP 104/63 | HR 96 | Temp 98.5°F | Resp 16 | Ht 66.0 in | Wt 162.5 lb

## 2018-11-19 DIAGNOSIS — R05 Cough: Secondary | ICD-10-CM

## 2018-11-19 DIAGNOSIS — J180 Bronchopneumonia, unspecified organism: Secondary | ICD-10-CM

## 2018-11-19 DIAGNOSIS — J9801 Acute bronchospasm: Secondary | ICD-10-CM | POA: Diagnosis not present

## 2018-11-19 DIAGNOSIS — R059 Cough, unspecified: Secondary | ICD-10-CM

## 2018-11-19 MED ORDER — ALBUTEROL SULFATE HFA 108 (90 BASE) MCG/ACT IN AERS: 2.0000 | INHALATION_SPRAY | RESPIRATORY_TRACT | 0 refills | Status: DC | PRN

## 2018-11-19 MED ORDER — IPRATROPIUM-ALBUTEROL 0.5-2.5 (3) MG/3ML IN SOLN
3.0000 mL | Freq: Once | RESPIRATORY_TRACT | Status: AC
  Administered 2018-11-19: 3 mL via RESPIRATORY_TRACT

## 2018-11-19 NOTE — Progress Notes (Signed)
OFFICE VISIT  11/20/2018   CC:  Chief Complaint  Patient presents with  . Cough    Continues to have cough and feels at times she is unable to catch her breath at times    HPI:    Patient is a 85 y.o. Caucasian female who presents for respiratory symptoms. I last saw her 9 days ago and rx'd a 10d course of prednisone and levaquin. She is improved but still with some significant periods of worsened cough and feeling of difficulty breathing. No fevers.  Only occ sputum production.  No wheezing is heard.  Cough is worse at night. No sT or HA.  No CP.  No LE swelling.  Wt is up 2 lbs from last visit. Eating and drinking "too well".  Still feeling somewhat fatigued.     Past Medical History:  Diagnosis Date  . Corneal dystrophies, hereditary   . Diabetes mellitus 10/2010   A1c 6.7%   . Diverticulosis   . Hyperlipemia   . Hypertension   . Hypothyroidism   . Lumbar spondylosis 05/2016   x-ray  . Osteoarthritis   . Transfusion history 1959   post partum    Past Surgical History:  Procedure Laterality Date  . APPENDECTOMY  1959  . CATARACT EXTRACTION, BILATERAL     Dr Bing Plume  . CESAREAN SECTION  1957; 47  . COLECTOMY     18 inches removed 2002-2003 for Diverticulitis  . COLONOSCOPY     Tics,Dr Sam [place]--pt declines any further screening as of 12/2015.  Marland Kitchen DEXA  06/30/2016   T score 2.5.  Repeat 3 yrs.  Marland Kitchen VAGINAL HYSTERECTOMY  1975   For Fibroids (no BSO)    Outpatient Medications Prior to Visit  Medication Sig Dispense Refill  . atorvastatin (LIPITOR) 40 MG tablet TAKE 1 TABLET (40 MG TOTAL) BY MOUTH DAILY. 90 tablet 1  . cycloSPORINE (RESTASIS) 0.05 % ophthalmic emulsion Place 1 drop into both eyes 2 (two) times daily.      . hydrochlorothiazide (HYDRODIURIL) 12.5 MG tablet TAKE 1 TABLET EVERY DAY IN PLACE OF METHYCLOTHIAZIDE 90 tablet 1  . levothyroxine (SYNTHROID, LEVOTHROID) 50 MCG tablet TAKE 1 TABLET EVERY DAY  EXCEPT TAKE 1/2 TABLET ON TUESDAY AND SATURDAY 78  tablet 1  . ramipril (ALTACE) 5 MG capsule TAKE 1 CAPSULE EVERY DAY 90 capsule 0  . levofloxacin (LEVAQUIN) 500 MG tablet Take 1 tablet (500 mg total) by mouth daily. (Patient not taking: Reported on 11/19/2018) 7 tablet 0  . predniSONE (DELTASONE) 20 MG tablet 2 tabs po qd x 5d, then 1 tab po qd x 5d (Patient not taking: Reported on 11/19/2018) 15 tablet 0   No facility-administered medications prior to visit.     Allergies  Allergen Reactions  . Doxycycline Hyclate Diarrhea  . Penicillins     Rash Because of a history of documented adverse serious drug reaction;Medi Alert bracelet  is recommended  . Tetanus Toxoid     REACTION: Arm swelling-severe Because of a history of documented adverse serious drug reaction;Medi Alert bracelet  is recommended  . Metformin And Related Nausea And Vomiting and Other (See Comments)    Abdominal pain    ROS As per HPI  PE: Blood pressure 104/63, pulse 96, temperature 98.5 F (36.9 C), temperature source Oral, resp. rate 16, height 5\' 6"  (1.676 m), weight 162 lb 8 oz (73.7 kg), SpO2 98 %. Gen: Alert, well appearing.  Patient is oriented to person, place, time, and situation. AFFECT: pleasant,  lucid thought and speech. ENT: Ears: EACs clear, normal epithelium.  TMs with good light reflex and landmarks bilaterally.  Eyes: no injection, icteris, swelling, or exudate.  EOMI, PERRLA. Nose: no drainage or turbinate edema/swelling.  No injection or focal lesion.  Mouth: lips without lesion/swelling.  Oral mucosa pink and moist.  Dentition intact and without obvious caries or gingival swelling.  Oropharynx without erythema, exudate, or swelling.  Neck - No masses or thyromegaly or limitation in range of motion CV: RRR, no m/r/g.   LUNGS: CTA bilat, nonlabored resps, good aeration in all lung fields. EXT: no clubbing or cyanosis.  no edema.    LABS:    Chemistry      Component Value Date/Time   NA 140 07/04/2018 0955   K 4.1 07/04/2018 0955   CL 99  07/04/2018 0955   CO2 36 (H) 07/04/2018 0955   BUN 23 07/04/2018 0955   CREATININE 0.80 07/04/2018 0955      Component Value Date/Time   CALCIUM 10.0 07/04/2018 0955   ALKPHOS 76 12/26/2017 0905   AST 18 12/26/2017 0905   ALT 21 12/26/2017 0905   BILITOT 0.8 12/26/2017 0905     Lab Results  Component Value Date   WBC 5.1 12/24/2016   HGB 14.7 12/24/2016   HCT 44.0 12/24/2016   MCV 92.8 12/24/2016   PLT 208.0 12/24/2016   Peak flows PRE duoneb today: 280, 290, 310. Peak flows POST duoneb today: avg 320 (normal for her would be about 450 L/min)  IMPRESSION AND PLAN:  Acute bronchitis.   She had mild improvement in cough and breathing about 15 min after duoneb in office today. She is improved but I think her remaining cough is from bronchospasm. I recommended albuterol HFA 2 p q4h prn cough or wheeze and she was agreeable to this.  Spent 30 min with pt today, with >50% of this time spent in counseling and care coordination regarding the above problems.  An After Visit Summary was printed and given to the patient.  FOLLOW UP: Return for 7-10d f/u resp.  Signed:  Crissie Sickles, MD           11/20/2018

## 2018-11-27 ENCOUNTER — Ambulatory Visit (INDEPENDENT_AMBULATORY_CARE_PROVIDER_SITE_OTHER): Payer: Medicare HMO | Admitting: Family Medicine

## 2018-11-27 ENCOUNTER — Encounter: Payer: Self-pay | Admitting: Family Medicine

## 2018-11-27 VITALS — BP 112/72 | HR 81 | Temp 97.8°F | Resp 16 | Ht 66.0 in | Wt 162.1 lb

## 2018-11-27 DIAGNOSIS — J180 Bronchopneumonia, unspecified organism: Secondary | ICD-10-CM

## 2018-11-27 NOTE — Progress Notes (Signed)
OFFICE VISIT  11/27/2018   CC:  Chief Complaint  Patient presents with  . Follow-up    Resp. Illness     HPI:    Patient is a 85 y.o. Caucasian female who presents for f/u recent acute bronchopneumonia. At last o/v 1 week ago she was significantly improved but still having some coughing spells that I felt were likely bronchospasm. I rx'd albuterol inhaler at that time.  Interim hx: Doing MUCH better. Rarely has to use albuterol, and when she does she finds it helpful for excessive coughing. No SOB, CP, or fevers. Eating well and energy level is back up. Still a little PND cough for the most part, o/w she feels like she is over this.   Past Medical History:  Diagnosis Date  . Corneal dystrophies, hereditary   . Diabetes mellitus 10/2010   A1c 6.7%   . Diverticulosis   . Hyperlipemia   . Hypertension   . Hypothyroidism   . Lumbar spondylosis 05/2016   x-ray  . Osteoarthritis   . Transfusion history 1959   post partum    Past Surgical History:  Procedure Laterality Date  . APPENDECTOMY  1959  . CATARACT EXTRACTION, BILATERAL     Dr Bing Plume  . CESAREAN SECTION  1957; 38  . COLECTOMY     18 inches removed 2002-2003 for Diverticulitis  . COLONOSCOPY     Tics,Dr Sam [place]--pt declines any further screening as of 12/2015.  Marland Kitchen DEXA  06/30/2016   T score 2.5.  Repeat 3 yrs.  Marland Kitchen VAGINAL HYSTERECTOMY  1975   For Fibroids (no BSO)    Outpatient Medications Prior to Visit  Medication Sig Dispense Refill  . albuterol (VENTOLIN HFA) 108 (90 Base) MCG/ACT inhaler Inhale 2 puffs into the lungs every 4 (four) hours as needed for wheezing or shortness of breath. 1 Inhaler 0  . atorvastatin (LIPITOR) 40 MG tablet TAKE 1 TABLET (40 MG TOTAL) BY MOUTH DAILY. 90 tablet 1  . cycloSPORINE (RESTASIS) 0.05 % ophthalmic emulsion Place 1 drop into both eyes 2 (two) times daily.      . hydrochlorothiazide (HYDRODIURIL) 12.5 MG tablet TAKE 1 TABLET EVERY DAY IN PLACE OF METHYCLOTHIAZIDE  90 tablet 1  . levothyroxine (SYNTHROID, LEVOTHROID) 50 MCG tablet TAKE 1 TABLET EVERY DAY  EXCEPT TAKE 1/2 TABLET ON TUESDAY AND SATURDAY 78 tablet 1  . ramipril (ALTACE) 5 MG capsule TAKE 1 CAPSULE EVERY DAY 90 capsule 0  . levofloxacin (LEVAQUIN) 500 MG tablet Take 1 tablet (500 mg total) by mouth daily. (Patient not taking: Reported on 11/19/2018) 7 tablet 0  . predniSONE (DELTASONE) 20 MG tablet 2 tabs po qd x 5d, then 1 tab po qd x 5d (Patient not taking: Reported on 11/19/2018) 15 tablet 0   No facility-administered medications prior to visit.     Allergies  Allergen Reactions  . Doxycycline Hyclate Diarrhea  . Penicillins     Rash Because of a history of documented adverse serious drug reaction;Medi Alert bracelet  is recommended  . Tetanus Toxoid     REACTION: Arm swelling-severe Because of a history of documented adverse serious drug reaction;Medi Alert bracelet  is recommended  . Metformin And Related Nausea And Vomiting and Other (See Comments)    Abdominal pain    ROS As per HPI  PE: Blood pressure 112/72, pulse 81, temperature 97.8 F (36.6 C), temperature source Oral, resp. rate 16, height 5\' 6"  (1.676 m), weight 162 lb 2 oz (73.5 kg), SpO2 96 %.  Gen: Alert, well appearing.  Patient is oriented to person, place, time, and situation. AFFECT: pleasant, lucid thought and speech. CV: RRR, no m/r/g.   LUNGS: CTA bilat, nonlabored resps, good aeration in all lung fields.   LABS:    Chemistry      Component Value Date/Time   NA 140 07/04/2018 0955   K 4.1 07/04/2018 0955   CL 99 07/04/2018 0955   CO2 36 (H) 07/04/2018 0955   BUN 23 07/04/2018 0955   CREATININE 0.80 07/04/2018 0955      Component Value Date/Time   CALCIUM 10.0 07/04/2018 0955   ALKPHOS 76 12/26/2017 0905   AST 18 12/26/2017 0905   ALT 21 12/26/2017 0905   BILITOT 0.8 12/26/2017 0905      IMPRESSION AND PLAN:  Acute bronchopneumonia: DOING GREAT now, with just a bit of lingering PND  cough. Signs/symptoms to call or return for were reviewed and pt expressed understanding.  An After Visit Summary was printed and given to the patient.  FOLLOW UP: Return for keep appt scheduled for 01/13/2019.  Signed:  Crissie Sickles, MD           11/27/2018

## 2018-12-07 DIAGNOSIS — H353132 Nonexudative age-related macular degeneration, bilateral, intermediate dry stage: Secondary | ICD-10-CM | POA: Diagnosis not present

## 2018-12-08 ENCOUNTER — Other Ambulatory Visit: Payer: Self-pay | Admitting: Family Medicine

## 2018-12-09 ENCOUNTER — Other Ambulatory Visit: Payer: Self-pay | Admitting: Family Medicine

## 2018-12-09 DIAGNOSIS — Z1231 Encounter for screening mammogram for malignant neoplasm of breast: Secondary | ICD-10-CM

## 2018-12-15 ENCOUNTER — Other Ambulatory Visit: Payer: Self-pay | Admitting: Family Medicine

## 2018-12-26 ENCOUNTER — Ambulatory Visit
Admission: RE | Admit: 2018-12-26 | Discharge: 2018-12-26 | Disposition: A | Discharge status: Home / Self Care | Payer: Medicare HMO | Source: Ambulatory Visit | Attending: Family Medicine | Admitting: Family Medicine

## 2018-12-26 DIAGNOSIS — Z1231 Encounter for screening mammogram for malignant neoplasm of breast: Secondary | ICD-10-CM

## 2019-01-06 DIAGNOSIS — H353132 Nonexudative age-related macular degeneration, bilateral, intermediate dry stage: Secondary | ICD-10-CM | POA: Diagnosis not present

## 2019-01-13 ENCOUNTER — Encounter: Payer: Self-pay | Admitting: Family Medicine

## 2019-01-13 ENCOUNTER — Ambulatory Visit (INDEPENDENT_AMBULATORY_CARE_PROVIDER_SITE_OTHER): Payer: Medicare HMO | Admitting: Family Medicine

## 2019-01-13 VITALS — BP 147/84 | HR 78 | Temp 97.8°F | Resp 16 | Ht 66.0 in | Wt 159.4 lb

## 2019-01-13 DIAGNOSIS — E348 Other specified endocrine disorders: Secondary | ICD-10-CM

## 2019-01-13 DIAGNOSIS — E7800 Pure hypercholesterolemia, unspecified: Secondary | ICD-10-CM

## 2019-01-13 DIAGNOSIS — E663 Overweight: Secondary | ICD-10-CM

## 2019-01-13 DIAGNOSIS — E119 Type 2 diabetes mellitus without complications: Secondary | ICD-10-CM

## 2019-01-13 DIAGNOSIS — Z0000 Encounter for general adult medical examination without abnormal findings: Secondary | ICD-10-CM

## 2019-01-13 DIAGNOSIS — E039 Hypothyroidism, unspecified: Secondary | ICD-10-CM

## 2019-01-13 DIAGNOSIS — Z13820 Encounter for screening for osteoporosis: Secondary | ICD-10-CM | POA: Diagnosis not present

## 2019-01-13 LAB — HEMOGLOBIN A1C: Hgb A1c MFr Bld: 7.7 % — ABNORMAL HIGH (ref 4.6–6.5)

## 2019-01-13 LAB — COMPREHENSIVE METABOLIC PANEL
ALBUMIN: 4.1 g/dL (ref 3.5–5.2)
ALT: 19 U/L (ref 0–35)
AST: 19 U/L (ref 0–37)
Alkaline Phosphatase: 68 U/L (ref 39–117)
BUN: 24 mg/dL — ABNORMAL HIGH (ref 6–23)
CO2: 33 meq/L — AB (ref 19–32)
Calcium: 9.7 mg/dL (ref 8.4–10.5)
Chloride: 100 mEq/L (ref 96–112)
Creatinine, Ser: 0.74 mg/dL (ref 0.40–1.20)
GFR: 75.01 mL/min (ref >60.00)
GLUCOSE: 123 mg/dL — AB (ref 70–99)
Potassium: 4.1 mEq/L (ref 3.5–5.1)
Sodium: 141 mEq/L (ref 135–145)
Total Bilirubin: 0.9 mg/dL (ref 0.2–1.2)
Total Protein: 6.5 g/dL (ref 6.0–8.3)

## 2019-01-13 LAB — LIPID PANEL
Cholesterol: 167 mg/dL (ref 0–200)
HDL: 60.8 mg/dL (ref >39.00)
LDL CALC: 86 mg/dL (ref 0–99)
NonHDL: 106.04
Total CHOL/HDL Ratio: 3
Triglycerides: 100 mg/dL (ref 0.0–149.0)
VLDL: 20 mg/dL (ref 0.0–40.0)

## 2019-01-13 LAB — TSH: TSH: 0.24 u[IU]/mL — ABNORMAL LOW (ref 0.35–4.50)

## 2019-01-13 NOTE — Patient Instructions (Signed)

## 2019-01-13 NOTE — Progress Notes (Signed)
Office Note 01/13/2019  CC:  Chief Complaint  Patient presents with  . Annual Exam    Pt is fasting.     HPI:  Shannon Greene is a 85 y.o. White female who is here for annual health maintenance exam.  No complaints.  She is active taking care of her ill husband but no formal exercise regimen (some occasional walking). Diet: fair.  Eye exam scheduled 01/15/2019  Past Medical History:  Diagnosis Date  . Corneal dystrophies, hereditary   . Diabetes mellitus 10/2010   A1c 6.7%   . Diverticulosis   . Hyperlipemia   . Hypertension   . Hypothyroidism   . Lumbar spondylosis 05/2016   x-ray  . Osteoarthritis   . Transfusion history 1959   post partum    Past Surgical History:  Procedure Laterality Date  . APPENDECTOMY  1959  . CATARACT EXTRACTION, BILATERAL     Dr Bing Plume  . CESAREAN SECTION  1957; 39  . COLECTOMY     18 inches removed 2002-2003 for Diverticulitis  . COLONOSCOPY     Tics,Dr Sam [place]--pt declines any further screening as of 12/2015.  Marland Kitchen DEXA  06/30/2016   T score 2.5.  Repeat 3 yrs.  Marland Kitchen VAGINAL HYSTERECTOMY  1975   For Fibroids (no BSO)    Family History  Problem Relation Age of Onset  . Heart attack Mother 11  . Hypertension Mother   . Tuberculosis Father   . Diabetes Brother   . Stroke Brother 40  . Prostate cancer Brother   . Breast cancer Neg Hx     Social History   Socioeconomic History  . Marital status: Married    Spouse name: Not on file  . Number of children: Not on file  . Years of education: Not on file  . Highest education level: Not on file  Occupational History  . Not on file  Social Needs  . Financial resource strain: Not on file  . Food insecurity:    Worry: Not on file    Inability: Not on file  . Transportation needs:    Medical: Not on file    Non-medical: Not on file  Tobacco Use  . Smoking status: Never Smoker  . Smokeless tobacco: Never Used  Substance and Sexual Activity  . Alcohol use: No   . Drug use: No  . Sexual activity: Not on file  Lifestyle  . Physical activity:    Days per week: 0 days    Minutes per session: 0 min  . Stress: Not on file  Relationships  . Social connections:    Talks on phone: Not on file    Gets together: Not on file    Attends religious service: Not on file    Active member of club or organization: Not on file    Attends meetings of clubs or organizations: Not on file    Relationship status: Not on file  . Intimate partner violence:    Fear of current or ex partner: Not on file    Emotionally abused: Not on file    Physically abused: Not on file    Forced sexual activity: Not on file  Other Topics Concern  . Not on file  Social History Narrative   Married, 2 daughters.   Occupation: retired Facilities manager).   Silver sneakers.   No T/A/Ds.    Outpatient Medications Prior to Visit  Medication Sig Dispense Refill  . albuterol (VENTOLIN HFA) 108 (90 Base) MCG/ACT inhaler  Inhale 2 puffs into the lungs every 4 (four) hours as needed for wheezing or shortness of breath. 1 Inhaler 0  . atorvastatin (LIPITOR) 40 MG tablet TAKE 1 TABLET (40 MG TOTAL) BY MOUTH DAILY. 90 tablet 1  . cycloSPORINE (RESTASIS) 0.05 % ophthalmic emulsion Place 1 drop into both eyes 2 (two) times daily.      . hydrochlorothiazide (HYDRODIURIL) 12.5 MG tablet TAKE 1 TABLET EVERY DAY IN PLACE OF METHYCLOTHIAZIDE 90 tablet 1  . levothyroxine (SYNTHROID, LEVOTHROID) 50 MCG tablet TAKE 1 TABLET EVERY DAY  EXCEPT TAKE 1/2 TABLET ON TUESDAY AND SATURDAY 78 tablet 1  . ramipril (ALTACE) 5 MG capsule TAKE 1 CAPSULE EVERY DAY 90 capsule 0   No facility-administered medications prior to visit.     Allergies  Allergen Reactions  . Doxycycline Hyclate Diarrhea  . Penicillins     Rash Because of a history of documented adverse serious drug reaction;Medi Alert bracelet  is recommended  . Tetanus Toxoid     REACTION: Arm swelling-severe Because of a history of  documented adverse serious drug reaction;Medi Alert bracelet  is recommended  . Metformin And Related Nausea And Vomiting and Other (See Comments)    Abdominal pain    ROS Review of Systems  Constitutional: Negative for appetite change, chills, fatigue and fever.  HENT: Negative for congestion, dental problem, ear pain and sore throat.   Eyes: Negative for discharge, redness and visual disturbance.  Respiratory: Negative for cough, chest tightness, shortness of breath and wheezing.   Cardiovascular: Negative for chest pain, palpitations and leg swelling.  Gastrointestinal: Negative for abdominal pain, blood in stool, diarrhea, nausea and vomiting.  Genitourinary: Negative for difficulty urinating, dysuria, flank pain, frequency, hematuria and urgency.  Musculoskeletal: Negative for arthralgias, back pain, joint swelling, myalgias and neck stiffness.  Skin: Negative for pallor and rash.  Neurological: Negative for dizziness, speech difficulty, weakness and headaches.  Hematological: Negative for adenopathy. Does not bruise/bleed easily.  Psychiatric/Behavioral: Negative for confusion and sleep disturbance. The patient is not nervous/anxious.     PE; Blood pressure (!) 147/84, pulse 78, temperature 97.8 F (36.6 C), temperature source Oral, resp. rate 16, height 5\' 6"  (1.676 m), weight 159 lb 6 oz (72.3 kg), SpO2 96 %. Body mass index is 25.72 kg/m. Pt examined with Helayne Seminole, CMA, as chaperone.  Gen: Alert, well appearing.  Patient is oriented to person, place, time, and situation. AFFECT: pleasant, lucid thought and speech. ENT: Ears: EACs clear, normal epithelium.  TMs with good light reflex and landmarks bilaterally.  Eyes: no injection, icteris, swelling, or exudate.  EOMI, PERRLA. Nose: no drainage or turbinate edema/swelling.  No injection or focal lesion.  Mouth: lips without lesion/swelling.  Oral mucosa pink and moist.  Dentition intact and without obvious caries or  gingival swelling.  Oropharynx without erythema, exudate, or swelling.  Neck: supple/nontender.  No LAD, mass, or TM.  Carotid pulses 2+ bilaterally, without bruits. CV: RRR, no m/r/g.   LUNGS: CTA bilat, nonlabored resps, good aeration in all lung fields. ABD: soft, NT, ND, BS normal.  No hepatospenomegaly or mass.  No bruits. EXT: no clubbing, cyanosis, or edema.  Musculoskeletal: no joint swelling, erythema, warmth, or tenderness.  ROM of all joints intact. Skin - no sores or suspicious lesions or rashes or color changes   Pertinent labs:  Lab Results  Component Value Date   TSH 0.59 12/26/2017   Lab Results  Component Value Date   WBC 5.1 12/24/2016   HGB  14.7 12/24/2016   HCT 44.0 12/24/2016   MCV 92.8 12/24/2016   PLT 208.0 12/24/2016   Lab Results  Component Value Date   CREATININE 0.80 07/04/2018   BUN 23 07/04/2018   NA 140 07/04/2018   K 4.1 07/04/2018   CL 99 07/04/2018   CO2 36 (H) 07/04/2018   Lab Results  Component Value Date   ALT 21 12/26/2017   AST 18 12/26/2017   ALKPHOS 76 12/26/2017   BILITOT 0.8 12/26/2017   Lab Results  Component Value Date   CHOL 169 12/26/2017   Lab Results  Component Value Date   HDL 58.80 12/26/2017   Lab Results  Component Value Date   LDLCALC 90 12/26/2017   Lab Results  Component Value Date   TRIG 100.0 12/26/2017   Lab Results  Component Value Date   CHOLHDL 3 12/26/2017   Lab Results  Component Value Date   HGBA1C 6.7 (A) 10/16/2018     ASSESSMENT AND PLAN:   Health maintenance exam: Reviewed age and gender appropriate health maintenance issues (prudent diet, regular exercise, health risks of tobacco and excessive alcohol, use of seatbelts, fire alarms in home, use of sunscreen).  Also reviewed age and gender appropriate health screening as well as vaccine recommendations. Vaccines: Vaccines all UTD. Labs: CMET, FLP, TSH, Hba1c. Cervical ca screening: no longer indicated. Breast ca screening:  12/26/2018= last mammogram (UTD) Colon ca screening: no further colonoscopies per pt preference. DEXA: due for repeat--->ordered today.  An After Visit Summary was printed and given to the patient.  FOLLOW UP:  Return in about 6 months (around 07/16/2019) for routine chronic illness f/u.  Signed:  Crissie Sickles, MD           01/13/2019

## 2019-01-14 ENCOUNTER — Telehealth: Payer: Self-pay | Admitting: Family Medicine

## 2019-01-14 NOTE — Telephone Encounter (Signed)
Copied from [place] [id number]. Topic: Quick Communication - Lab Results (Clinic Use ONLY) >> Jan 14, 2019 11:35 AM Onalee Hua, CMA wrote: See lab result note dated 01/13/19. Result note has been sent to [facility] NR Triage.  Okay for [facility] to discuss results/PCP recommendations.  Pt is calling back needing lab results

## 2019-01-15 DIAGNOSIS — H1850 Unspecified hereditary corneal dystrophies: Secondary | ICD-10-CM | POA: Diagnosis not present

## 2019-01-15 DIAGNOSIS — H353131 Nonexudative age-related macular degeneration, bilateral, early dry stage: Secondary | ICD-10-CM | POA: Diagnosis not present

## 2019-01-15 DIAGNOSIS — H40013 Open angle with borderline findings, low risk, bilateral: Secondary | ICD-10-CM | POA: Diagnosis not present

## 2019-01-15 DIAGNOSIS — H04123 Dry eye syndrome of bilateral lacrimal glands: Secondary | ICD-10-CM | POA: Diagnosis not present

## 2019-01-15 LAB — HM DIABETES EYE EXAM

## 2019-02-05 DIAGNOSIS — H353132 Nonexudative age-related macular degeneration, bilateral, intermediate dry stage: Secondary | ICD-10-CM | POA: Diagnosis not present

## 2019-02-25 DIAGNOSIS — H1850 Unspecified hereditary corneal dystrophies: Secondary | ICD-10-CM | POA: Diagnosis not present

## 2019-02-25 DIAGNOSIS — H353131 Nonexudative age-related macular degeneration, bilateral, early dry stage: Secondary | ICD-10-CM | POA: Diagnosis not present

## 2019-02-25 DIAGNOSIS — H40013 Open angle with borderline findings, low risk, bilateral: Secondary | ICD-10-CM | POA: Diagnosis not present

## 2019-02-25 DIAGNOSIS — E119 Type 2 diabetes mellitus without complications: Secondary | ICD-10-CM | POA: Diagnosis not present

## 2019-02-25 DIAGNOSIS — H04123 Dry eye syndrome of bilateral lacrimal glands: Secondary | ICD-10-CM | POA: Diagnosis not present

## 2019-03-06 ENCOUNTER — Other Ambulatory Visit: Payer: Self-pay | Admitting: Family Medicine

## 2019-03-06 DIAGNOSIS — H353132 Nonexudative age-related macular degeneration, bilateral, intermediate dry stage: Secondary | ICD-10-CM | POA: Diagnosis not present

## 2019-03-18 ENCOUNTER — Other Ambulatory Visit: Payer: Self-pay | Admitting: Family Medicine

## 2019-04-21 ENCOUNTER — Encounter: Payer: Self-pay | Admitting: Family Medicine

## 2019-04-21 ENCOUNTER — Other Ambulatory Visit: Payer: Self-pay

## 2019-04-21 ENCOUNTER — Ambulatory Visit (INDEPENDENT_AMBULATORY_CARE_PROVIDER_SITE_OTHER): Payer: Medicare HMO | Admitting: Family Medicine

## 2019-04-21 VITALS — BP 122/72 | HR 75 | Temp 96.9°F | Resp 16 | Ht 66.0 in | Wt 160.8 lb

## 2019-04-21 DIAGNOSIS — E785 Hyperlipidemia, unspecified: Secondary | ICD-10-CM

## 2019-04-21 DIAGNOSIS — E119 Type 2 diabetes mellitus without complications: Secondary | ICD-10-CM | POA: Diagnosis not present

## 2019-04-21 DIAGNOSIS — M810 Age-related osteoporosis without current pathological fracture: Secondary | ICD-10-CM | POA: Diagnosis not present

## 2019-04-21 DIAGNOSIS — I10 Essential (primary) hypertension: Secondary | ICD-10-CM | POA: Diagnosis not present

## 2019-04-21 LAB — HEMOGLOBIN A1C: Hgb A1c MFr Bld: 7.4 % — ABNORMAL HIGH (ref 4.6–6.5)

## 2019-04-21 NOTE — Progress Notes (Signed)
OFFICE VISIT  04/21/2019   CC:  Chief Complaint  Patient presents with  . Follow-up    diabetes, 3 month   HPI:    Patient is a 85 y.o. Caucasian female who presents for 3 mo f/u DM 2, HTN, HLD. Feeling well, no complaints.  DM: no home monitoring.  Diabetic diet ---good.  I recommended metformin after last a1c came back 7.7% but she declined. HTN: home bp's 125/70s, 75. HLD: tolerating statin, compliant daily.  Activity level is still very good, very big "honey do" list.  ROS: no CP, no SOB, no wheezing, no cough, no dizziness, no HAs, no rashes, no melena/hematochezia.  No polyuria or polydipsia.  No myalgias or arthralgias.   Past Medical History:  Diagnosis Date  . Corneal dystrophies, hereditary   . Diabetes mellitus 10/2010   A1c 6.7%   . Diverticulosis   . Hyperlipemia   . Hypertension   . Hypothyroidism   . Lumbar spondylosis 05/2016   x-ray  . Osteoarthritis   . Transfusion history 1959   post partum    Past Surgical History:  Procedure Laterality Date  . APPENDECTOMY  1959  . CATARACT EXTRACTION, BILATERAL     Dr Bing Plume  . CESAREAN SECTION  1957; 39  . COLECTOMY     18 inches removed 2002-2003 for Diverticulitis  . COLONOSCOPY     Tics,Dr Sam --pt declines any further screening as of 12/2015.  Marland Kitchen DEXA  06/30/2016   T score 2.5.  Repeat 3 yrs.  Marland Kitchen VAGINAL HYSTERECTOMY  1975   For Fibroids (no BSO)    Outpatient Medications Prior to Visit  Medication Sig Dispense Refill  . atorvastatin (LIPITOR) 40 MG tablet TAKE 1 TABLET (40 MG TOTAL) BY MOUTH DAILY. 90 tablet 1  . cycloSPORINE (RESTASIS) 0.05 % ophthalmic emulsion Place 1 drop into both eyes 2 (two) times daily.      . hydrochlorothiazide (HYDRODIURIL) 12.5 MG tablet TAKE 1 TABLET EVERY DAY IN PLACE OF METHYCLOTHIAZIDE 90 tablet 1  . levothyroxine (SYNTHROID, LEVOTHROID) 50 MCG tablet TAKE 1 TABLET EVERY DAY  EXCEPT TAKE 1/2 TABLET ON TUESDAY AND SATURDAY 78 tablet 1  . ramipril (ALTACE) 5  MG capsule TAKE 1 CAPSULE EVERY DAY 90 capsule 0  . albuterol (VENTOLIN HFA) 108 (90 Base) MCG/ACT inhaler Inhale 2 puffs into the lungs every 4 (four) hours as needed for wheezing or shortness of breath. (Patient not taking: Reported on 04/21/2019) 1 Inhaler 0   No facility-administered medications prior to visit.     Allergies  Allergen Reactions  . Doxycycline Hyclate Diarrhea  . Penicillins     Rash Because of a history of documented adverse serious drug reaction;Medi Alert bracelet  is recommended  . Tetanus Toxoid     REACTION: Arm swelling-severe Because of a history of documented adverse serious drug reaction;Medi Alert bracelet  is recommended  . Metformin And Related Nausea And Vomiting and Other (See Comments)    Abdominal pain    ROS As per HPI  PE: Blood pressure 122/72, pulse 75, temperature (!) 96.9 F (36.1 C), temperature source Temporal, resp. rate 16, height 5\' 6"  (1.676 m), weight 160 lb 12.8 oz (72.9 kg), SpO2 98 %. Body mass index is 25.95 kg/m.  Gen: Alert, well appearing.  Patient is oriented to person, place, time, and situation. AFFECT: pleasant, lucid thought and speech. No further exam today.  LABS:    Chemistry      Component Value Date/Time   NA 141  01/13/2019 0931   K 4.1 01/13/2019 0931   CL 100 01/13/2019 0931   CO2 33 (H) 01/13/2019 0931   BUN 24 (H) 01/13/2019 0931   CREATININE 0.74 01/13/2019 0931      Component Value Date/Time   CALCIUM 9.7 01/13/2019 0931   ALKPHOS 68 01/13/2019 0931   AST 19 01/13/2019 0931   ALT 19 01/13/2019 0931   BILITOT 0.9 01/13/2019 0931     Lab Results  Component Value Date   CHOL 167 01/13/2019   HDL 60.80 01/13/2019   LDLCALC 86 01/13/2019   TRIG 100.0 01/13/2019   CHOLHDL 3 01/13/2019   Lab Results  Component Value Date   HGBA1C 7.7 (H) 01/13/2019   Lab Results  Component Value Date   TSH 0.24 (L) 01/13/2019    IMPRESSION AND PLAN:  1) HTN: The current medical regimen is effective;   continue present plan and medications. Lytes/cr excellent 3 mo ago.  2) DM 2, A1c up last visit and pt declined my recommendation of getting on metformin.  3) HLD: tolerating statin.  FLP and hepatic panel excellent 3 mo ago.  4) Osteoporosis/penia: last T score was -2.5.  On Ca and Vit D only. Next DEXA due 3 mo, and we'll check Ca and Vit D level at that time as well.  An After Visit Summary was printed and given to the patient.  FOLLOW UP: Return in about 3 months (around 07/22/2019) for routine chronic illness f/u.  Signed:  Crissie Sickles, MD           04/21/2019

## 2019-04-23 ENCOUNTER — Telehealth: Payer: Self-pay

## 2019-04-23 NOTE — Telephone Encounter (Signed)
My Chart message sent

## 2019-04-30 ENCOUNTER — Other Ambulatory Visit: Payer: Self-pay | Admitting: Family Medicine

## 2019-05-05 DIAGNOSIS — H353132 Nonexudative age-related macular degeneration, bilateral, intermediate dry stage: Secondary | ICD-10-CM | POA: Diagnosis not present

## 2019-05-15 ENCOUNTER — Other Ambulatory Visit: Payer: Self-pay | Admitting: Family Medicine

## 2019-06-04 ENCOUNTER — Other Ambulatory Visit: Payer: Self-pay | Admitting: Family Medicine

## 2019-06-04 DIAGNOSIS — H353132 Nonexudative age-related macular degeneration, bilateral, intermediate dry stage: Secondary | ICD-10-CM | POA: Diagnosis not present

## 2019-07-04 DIAGNOSIS — H353132 Nonexudative age-related macular degeneration, bilateral, intermediate dry stage: Secondary | ICD-10-CM | POA: Diagnosis not present

## 2019-07-14 ENCOUNTER — Ambulatory Visit (INDEPENDENT_AMBULATORY_CARE_PROVIDER_SITE_OTHER): Payer: Medicare HMO | Admitting: Family Medicine

## 2019-07-14 ENCOUNTER — Ambulatory Visit: Payer: Medicare HMO

## 2019-07-14 ENCOUNTER — Encounter: Payer: Self-pay | Admitting: Family Medicine

## 2019-07-14 ENCOUNTER — Other Ambulatory Visit (INDEPENDENT_AMBULATORY_CARE_PROVIDER_SITE_OTHER): Payer: Medicare HMO

## 2019-07-14 ENCOUNTER — Other Ambulatory Visit: Payer: Self-pay

## 2019-07-14 VITALS — BP 122/75 | HR 70 | Temp 97.7°F | Resp 16 | Ht 66.0 in | Wt 160.0 lb

## 2019-07-14 DIAGNOSIS — E119 Type 2 diabetes mellitus without complications: Secondary | ICD-10-CM

## 2019-07-14 DIAGNOSIS — E663 Overweight: Secondary | ICD-10-CM | POA: Diagnosis not present

## 2019-07-14 DIAGNOSIS — Z23 Encounter for immunization: Secondary | ICD-10-CM

## 2019-07-14 DIAGNOSIS — E2839 Other primary ovarian failure: Secondary | ICD-10-CM | POA: Diagnosis not present

## 2019-07-14 DIAGNOSIS — Z13820 Encounter for screening for osteoporosis: Secondary | ICD-10-CM

## 2019-07-14 DIAGNOSIS — I10 Essential (primary) hypertension: Secondary | ICD-10-CM | POA: Diagnosis not present

## 2019-07-14 DIAGNOSIS — E7800 Pure hypercholesterolemia, unspecified: Secondary | ICD-10-CM

## 2019-07-14 LAB — BASIC METABOLIC PANEL
BUN: 20 mg/dL (ref 6–23)
CO2: 32 mEq/L (ref 19–32)
Calcium: 9.3 mg/dL (ref 8.4–10.5)
Chloride: 100 mEq/L (ref 96–112)
Creatinine, Ser: 0.69 mg/dL (ref 0.40–1.20)
GFR: 81.22 mL/min (ref >60.00)
Glucose, Bld: 112 mg/dL — ABNORMAL HIGH (ref 70–99)
Potassium: 4.1 mEq/L (ref 3.5–5.1)
Sodium: 141 mEq/L (ref 135–145)

## 2019-07-14 NOTE — Patient Instructions (Signed)
To schedule your screening test for osteoporosis (bone density test), call [phone].

## 2019-07-14 NOTE — Progress Notes (Signed)
OFFICE VISIT  07/14/2019   CC:  Chief Complaint  Patient presents with  . Follow-up    [name], pt is fasting    HPI:    Patient is a 85 y.o. Caucasian female who presents for 3 mo f/u DM 2, HTN, and hypothyroidism.  DM: I recommended starting metformin 6 mo ago when her A1c was 7.7%, but she declined and has been working more on diet/exercise. F/u a1c 3 mo ago was improved to 7.4%. No home gluc monitoring. No burning, tingling, or numbness in feet.  HTN: home bp consistently 120s/70s.   Hypothyroidism:  TSH at [facility] 6 mo ago, no change in dose was made at that time.  We have been following TSH annually and they have all been stable at same level.  HLD: compliant with statin w/out problems.   Past Medical History:  Diagnosis Date  . Corneal dystrophies, hereditary   . Diabetes mellitus 10/2010   A1c 6.7%   . Diverticulosis   . Hyperlipemia   . Hypertension   . Hypothyroidism   . Lumbar spondylosis 05/2016   x-ray  . Osteoarthritis   . Transfusion history 1959   post partum    Past Surgical History:  Procedure Laterality Date  . APPENDECTOMY  1959  . CATARACT EXTRACTION, BILATERAL     Dr Bing Plume  . CESAREAN SECTION  1957; 73  . COLECTOMY     18 inches removed 2002-2003 for Diverticulitis  . COLONOSCOPY     Tics,Dr Sam [place]--pt declines any further screening as of 12/2015.  Marland Kitchen DEXA  06/30/2016   T score 2.5.  Repeat 3 yrs.  Marland Kitchen VAGINAL HYSTERECTOMY  1975   For Fibroids (no BSO)    Outpatient Medications Prior to Visit  Medication Sig Dispense Refill  . atorvastatin (LIPITOR) 40 MG tablet TAKE 1 TABLET EVERY DAY 90 tablet 1  . cycloSPORINE (RESTASIS) 0.05 % ophthalmic emulsion Place 1 drop into both eyes 2 (two) times daily.      . hydrochlorothiazide (HYDRODIURIL) 12.5 MG tablet TAKE 1 TABLET EVERY DAY IN PLACE OF METHYCLOTHIAZIDE 90 tablet 1  . levothyroxine (SYNTHROID, LEVOTHROID) 50 MCG tablet TAKE 1 TABLET EVERY DAY  EXCEPT TAKE 1/2 TABLET ON TUESDAY AND  SATURDAY 78 tablet 1  . ramipril (ALTACE) 5 MG capsule TAKE 1 CAPSULE EVERY DAY 90 capsule 0  . albuterol (VENTOLIN HFA) 108 (90 Base) MCG/ACT inhaler Inhale 2 puffs into the lungs every 4 (four) hours as needed for wheezing or shortness of breath. (Patient not taking: Reported on 04/21/2019) 1 Inhaler 0   No facility-administered medications prior to visit.     Allergies  Allergen Reactions  . Doxycycline Hyclate Diarrhea  . Penicillins     Rash Because of a history of documented adverse serious drug reaction;Medi Alert bracelet  is recommended  . Tetanus Toxoid     REACTION: Arm swelling-severe Because of a history of documented adverse serious drug reaction;Medi Alert bracelet  is recommended  . Metformin And Related Nausea And Vomiting and Other (See Comments)    Abdominal pain    ROS As per HPI  PE: Blood pressure 122/75, pulse 70, temperature 97.7 F (36.5 C), temperature source Temporal, resp. rate 16, height 5\' 6"  (1.676 m), weight 160 lb (72.6 kg), SpO2 97 %. Body mass index is 25.82 kg/m.  Gen: Alert, well appearing.  Patient is oriented to person, place, time, and situation. AFFECT: pleasant, lucid thought and speech. Foot exam - both sides normal; no swelling, tenderness or  skin or vascular lesions. Color and temperature is normal. Sensation is intact. Peripheral pulses are palpable. Toenails are normal.   LABS:  Lab Results  Component Value Date   TSH 0.24 (L) 01/13/2019   Lab Results  Component Value Date   WBC 5.1 12/24/2016   HGB 14.7 12/24/2016   HCT 44.0 12/24/2016   MCV 92.8 12/24/2016   PLT 208.0 12/24/2016   Lab Results  Component Value Date   CREATININE 0.74 01/13/2019   BUN 24 (H) 01/13/2019   NA 141 01/13/2019   K 4.1 01/13/2019   CL 100 01/13/2019   CO2 33 (H) 01/13/2019   Lab Results  Component Value Date   ALT 19 01/13/2019   AST 19 01/13/2019   ALKPHOS 68 01/13/2019   BILITOT 0.9 01/13/2019   Lab Results  Component Value Date    CHOL 167 01/13/2019   Lab Results  Component Value Date   HDL 60.80 01/13/2019   Lab Results  Component Value Date   LDLCALC 86 01/13/2019   Lab Results  Component Value Date   TRIG 100.0 01/13/2019   Lab Results  Component Value Date   CHOLHDL 3 01/13/2019   Lab Results  Component Value Date   HGBA1C 7.4 (H) 04/21/2019    IMPRESSION AND PLAN:  1) HTN: The current medical regimen is effective;  continue present plan and medications. Lytes/cr today.  2) DM 2, control has been fair.  She has declined my recommendation of starting metformin (6 mo ago). Recheck A1c today. Feet exam normal today.  3) Hypothyroidism: has been stable.  Plan recheck TSH 6 mo.  4) HLD: tolerating statin. Next FLP and AST/ALT in 6 mo.  5) Preventative health: flu vaccine today. She will call to schedule the DEXA I ordered back in March this year. [phone].  An After Visit Summary was printed and given to the patient.  FOLLOW UP: Return in about 3 months (around 10/13/2019) for routine chronic illness f/u. 6 mo for next CPE  Signed:  Crissie Sickles, MD           07/14/2019

## 2019-07-16 NOTE — Addendum Note (Signed)
Addended by: Marlene Bast A on: 07/16/2019 08:45 AM   Modules accepted: Orders

## 2019-07-23 DIAGNOSIS — H524 Presbyopia: Secondary | ICD-10-CM | POA: Diagnosis not present

## 2019-07-23 DIAGNOSIS — H40013 Open angle with borderline findings, low risk, bilateral: Secondary | ICD-10-CM | POA: Diagnosis not present

## 2019-07-23 DIAGNOSIS — H04123 Dry eye syndrome of bilateral lacrimal glands: Secondary | ICD-10-CM | POA: Diagnosis not present

## 2019-07-23 DIAGNOSIS — H1850 Unspecified hereditary corneal dystrophies: Secondary | ICD-10-CM | POA: Diagnosis not present

## 2019-07-23 DIAGNOSIS — H353131 Nonexudative age-related macular degeneration, bilateral, early dry stage: Secondary | ICD-10-CM | POA: Diagnosis not present

## 2019-07-29 NOTE — Progress Notes (Signed)
Subjective:   Shannon Greene is a 85 y.o. female who presents for Medicare Annual (Subsequent) preventive examination.  Review of Systems:  No ROS.  Medicare Wellness Visit.  See social history for additional risk factors.    Sleep patterns:  Home Safety/Smoke Alarms: Feels safe in home. Smoke alarms in place.  Living environment; residence and Firearm Safety: Lives with husband in 1 story home. Seat Belt Safety/Bike Helmet: Wears seat belt.   Female:   Pap-N/A      Mammo-12/26/2018, BI-RADS CATEGORY  1: Negative.     Dexa scan-06/28/2016, normal.         CCS-Colonoscopy > 10 years.      Objective:     Vitals: There were no vitals taken for this visit.  There is no height or weight on file to calculate BMI.  Advanced Directives 07/04/2018 06/24/2017  Does Patient Have a Medical Advance Directive? Yes Yes  Type of Paramedic of [place];Living will Living will;Healthcare Power of [place] in Chart? No - copy requested No - copy requested    Tobacco Social History   Tobacco Use  Smoking Status Never Smoker  Smokeless Tobacco Never Used     Counseling given: Not Answered    Past Medical History:  Diagnosis Date  . Corneal dystrophies, hereditary   . Diabetes mellitus 10/2010   A1c 6.7%   . Diverticulosis   . Hyperlipemia   . Hypertension   . Hypothyroidism   . Lumbar spondylosis 05/2016   x-ray  . Osteoarthritis   . Transfusion history 1959   post partum   Past Surgical History:  Procedure Laterality Date  . APPENDECTOMY  1959  . CATARACT EXTRACTION, BILATERAL     Dr Bing Plume  . CESAREAN SECTION  1957; 84  . COLECTOMY     18 inches removed 2002-2003 for Diverticulitis  . COLONOSCOPY     Tics,Dr Sam [place]--pt declines any further screening as of 12/2015.  Marland Kitchen DEXA  06/30/2016   T score 2.5.  Repeat 3 yrs.  Marland Kitchen VAGINAL HYSTERECTOMY  1975   For Fibroids (no BSO)   Family History   Problem Relation Age of Onset  . Heart attack Mother 81  . Hypertension Mother   . Tuberculosis Father   . Diabetes Brother   . Stroke Brother 40  . Prostate cancer Brother   . Breast cancer Neg Hx    Social History   Socioeconomic History  . Marital status: Married    Spouse name: Not on file  . Number of children: Not on file  . Years of education: Not on file  . Highest education level: Not on file  Occupational History  . Not on file  Social Needs  . Financial resource strain: Not on file  . Food insecurity    Worry: Not on file    Inability: Not on file  . Transportation needs    Medical: Not on file    Non-medical: Not on file  Tobacco Use  . Smoking status: Never Smoker  . Smokeless tobacco: Never Used  Substance and Sexual Activity  . Alcohol use: No  . Drug use: No  . Sexual activity: Not on file  Lifestyle  . Physical activity    Days per week: 0 days    Minutes per session: 0 min  . Stress: Not on file  Relationships  . Social connections    Talks on phone: Not on file  Gets together: Not on file    Attends religious service: Not on file    Active member of club or organization: Not on file    Attends meetings of clubs or organizations: Not on file    Relationship status: Not on file  Other Topics Concern  . Not on file  Social History Narrative   Married, 2 daughters.   Occupation: retired Facilities manager).   Silver sneakers.   No T/A/Ds.    Outpatient Encounter Medications as of 07/30/2019  Medication Sig  . albuterol (VENTOLIN HFA) 108 (90 Base) MCG/ACT inhaler Inhale 2 puffs into the lungs every 4 (four) hours as needed for wheezing or shortness of breath. (Patient not taking: Reported on 04/21/2019)  . atorvastatin (LIPITOR) 40 MG tablet TAKE 1 TABLET EVERY DAY  . cycloSPORINE (RESTASIS) 0.05 % ophthalmic emulsion Place 1 drop into both eyes 2 (two) times daily.    . hydrochlorothiazide (HYDRODIURIL) 12.5 MG tablet TAKE 1 TABLET  EVERY DAY IN PLACE OF METHYCLOTHIAZIDE  . levothyroxine (SYNTHROID, LEVOTHROID) 50 MCG tablet TAKE 1 TABLET EVERY DAY  EXCEPT TAKE 1/2 TABLET ON TUESDAY AND SATURDAY  . ramipril (ALTACE) 5 MG capsule TAKE 1 CAPSULE EVERY DAY   No facility-administered encounter medications on file as of 07/30/2019.     Activities of Daily Living No flowsheet data found.  Patient Care Team: Tammi Sou, MD as PCP - General (Family Medicine) Calvert Cantor, MD as Consulting Physician (Ophthalmology)    Assessment:   This is a routine wellness examination for Shannon Greene.  Exercise Activities and Dietary recommendations   Diet (meal preparation, eat out, water intake, caffeinated beverages, dairy products, fruits and vegetables):   Breakfast: Lunch:  Dinner:      Goals      Patient Stated   . <enter goal here> (pt-stated)     Maintain current health.       Other   . Patient Stated     Maintain weight by staying active and controlling carb intake.        Fall Risk Fall Risk  01/13/2019 10/16/2018 07/04/2018 06/24/2017 06/19/2016  Falls in the past year? 0 0 No No No    Depression Screen PHQ 2/9 Scores 01/13/2019 07/04/2018 06/24/2017 06/19/2016  PHQ - 2 Score 0 0 0 0  Exception Documentation - - - -     Cognitive Function MMSE - Mini Mental State Exam 07/04/2018  Orientation to time 5  Orientation to Place 5  Registration 3  Attention/ Calculation 5  Recall 3  Language- name 2 objects 2  Language- repeat 1  Language- follow 3 step command 3  Language- read & follow direction 1  Write a sentence 1  Copy design 1  Total score 30        Immunization History  Administered Date(s) Administered  . Fluad Quad(high Dose 65+) 07/14/2019  . Influenza Whole 07/06/2012  . Influenza, High Dose Seasonal PF 07/19/2014, 07/28/2015, 08/05/2017, 07/15/2018  . Influenza,inj,Quad PF,6+ Mos 07/31/2016  . Pneumococcal Conjugate-13 12/16/2015  . Pneumococcal-Unspecified 11/05/2001  . Td  12/24/2016  . Zoster 06/21/2016  . Zoster Recombinat (Shingrix) 03/29/2017, 08/12/2017     Screening Tests Health Maintenance  Topic Date Due  . OPHTHALMOLOGY EXAM  01/10/2019  . DEXA SCAN  06/29/2019  . HEMOGLOBIN A1C  10/21/2019  . FOOT EXAM  07/13/2020  . INFLUENZA VACCINE  Completed  . PNA vac Low Risk Adult  Completed        Plan:  I have personally reviewed and noted the following in the patient's chart:   . Medical and social history . Use of alcohol, tobacco or illicit drugs  . Current medications and supplements . Functional ability and status . Nutritional status . Physical activity . Advanced directives . List of other physicians . Hospitalizations, surgeries, and ER visits in previous 12 months . Vitals . Screenings to include cognitive, depression, and falls . Referrals and appointments  In addition, I have reviewed and discussed with patient certain preventive protocols, quality metrics, and best practice recommendations. A written personalized care plan for preventive services as well as general preventive health recommendations were provided to patient.     Gerilyn Nestle, RN  07/29/2019

## 2019-07-30 ENCOUNTER — Ambulatory Visit (INDEPENDENT_AMBULATORY_CARE_PROVIDER_SITE_OTHER): Payer: Medicare HMO

## 2019-07-30 ENCOUNTER — Other Ambulatory Visit: Payer: Self-pay

## 2019-07-30 VITALS — BP 118/62 | HR 77 | Ht 66.0 in | Wt 163.1 lb

## 2019-07-30 DIAGNOSIS — E119 Type 2 diabetes mellitus without complications: Secondary | ICD-10-CM | POA: Diagnosis not present

## 2019-07-30 DIAGNOSIS — Z0000 Encounter for general adult medical examination without abnormal findings: Secondary | ICD-10-CM | POA: Diagnosis not present

## 2019-07-30 LAB — POCT GLYCOSYLATED HEMOGLOBIN (HGB A1C)
HbA1c POC (<> result, manual entry): 6.8 % (ref 4.0–5.6)
HbA1c, POC (controlled diabetic range): 6.8 % (ref 0.0–7.0)
HbA1c, POC (prediabetic range): 6.8 % — AB (ref 5.7–6.4)
Hemoglobin A1C: 6.8 % — AB (ref 4.0–5.6)

## 2019-07-30 NOTE — Progress Notes (Signed)
Subjective:   Shannon Greene is a 85 y.o. female who presents for Medicare Annual (Subsequent) preventive examination.  Review of Systems:  No ROS.  Medicare Wellness Visit.  See social history for additional risk factors.  Cardiac Risk Factors include: advanced age (>59men, >10 women);diabetes mellitus;dyslipidemia   Sleep patterns: Sleeps 7-8 hours.  Home Safety/Smoke Alarms: Feels safe in home. Smoke alarms in place.  Living environment; residence and Firearm Safety: Lives with husband in 1 story home. Seat Belt Safety/Bike Helmet: Wears seat belt.   Female:   Pap-N/A      Mammo-12/26/2018, BI-RADS CATEGORY  1: Negative.     Dexa scan-06/28/2016, normal.         CCS-Colonoscopy > 10 years.      Objective:     Vitals: BP 118/62 (BP Location: Left Arm, Patient Position: Sitting, Cuff Size: Normal)   Pulse 77   Ht 5\' 6"  (1.676 m)   Wt 163 lb 2 oz (74 kg)   SpO2 95%   BMI 26.33 kg/m   Body mass index is 26.33 kg/m.  Advanced Directives 07/30/2019 07/04/2018 06/24/2017  Does Patient Have a Medical Advance Directive? Yes Yes Yes  Type of Paramedic of [place];Living will [place];Living will Living will;Healthcare Power of Shannon Greene in Chart? No - copy requested No - copy requested No - copy requested    Tobacco Social History   Tobacco Use  Smoking Status Never Smoker  Smokeless Tobacco Never Used     Counseling given: Not Answered    Past Medical History:  Diagnosis Date  . Corneal dystrophies, hereditary   . Diabetes mellitus 10/2010   A1c 6.7%   . Diverticulosis   . Hyperlipemia   . Hypertension   . Hypothyroidism   . Lumbar spondylosis 05/2016   x-ray  . Osteoarthritis   . Transfusion history 1959   post partum   Past Surgical History:  Procedure Laterality Date  . APPENDECTOMY  1959  . CATARACT EXTRACTION, BILATERAL     Dr Shannon Greene  . CESAREAN SECTION   1957; 78  . COLECTOMY     18 inches removed 2002-2003 for Diverticulitis  . COLONOSCOPY     Tics,Dr Shannon Greene--pt declines any further screening as of 12/2015.  Marland Kitchen DEXA  06/30/2016   T score 2.5.  Repeat 3 yrs.  Marland Kitchen VAGINAL HYSTERECTOMY  1975   For Fibroids (no BSO)   Family History  Problem Relation Age of Onset  . Heart attack Mother 55  . Hypertension Mother   . Tuberculosis Father   . Diabetes Brother   . Stroke Brother 40  . Prostate cancer Brother   . Breast cancer Neg Hx    Social History   Socioeconomic History  . Marital status: Married    Spouse name: Not on file  . Number of children: Not on file  . Years of education: Not on file  . Highest education level: Not on file  Occupational History  . Not on file  Social Needs  . Financial resource strain: Not on file  . Food insecurity    Worry: Not on file    Inability: Not on file  . Transportation needs    Medical: Not on file    Non-medical: Not on file  Tobacco Use  . Smoking status: Never Smoker  . Smokeless tobacco: Never Used  Substance and Sexual Activity  . Alcohol use: No  . Drug  use: No  . Sexual activity: Not on file  Lifestyle  . Physical activity    Days per week: 0 days    Minutes per session: 0 min  . Stress: Not on file  Relationships  . Social Herbalist on phone: Not on file    Gets together: Not on file    Attends religious service: Not on file    Active member of club or organization: Not on file    Attends meetings of clubs or organizations: Not on file    Relationship status: Not on file  Other Topics Concern  . Not on file  Social History Narrative   Married, 2 daughters.   Occupation: retired Facilities manager).   Silver sneakers.   No T/A/Ds.    Outpatient Encounter Medications as of 07/30/2019  Medication Sig  . atorvastatin (LIPITOR) 40 MG tablet TAKE 1 TABLET EVERY DAY  . cycloSPORINE (RESTASIS) 0.05 % ophthalmic emulsion Place 1 drop into both  eyes 2 (two) times daily.    . hydrochlorothiazide (HYDRODIURIL) 12.5 MG tablet TAKE 1 TABLET EVERY DAY IN PLACE OF METHYCLOTHIAZIDE  . levothyroxine (SYNTHROID, LEVOTHROID) 50 MCG tablet TAKE 1 TABLET EVERY DAY  EXCEPT TAKE 1/2 TABLET ON TUESDAY AND SATURDAY  . ramipril (ALTACE) 5 MG capsule TAKE 1 CAPSULE EVERY DAY  . [DISCONTINUED] albuterol (VENTOLIN HFA) 108 (90 Base) MCG/ACT inhaler Inhale 2 puffs into the lungs every 4 (four) hours as needed for wheezing or shortness of breath. (Patient not taking: Reported on 04/21/2019)   No facility-administered encounter medications on file as of 07/30/2019.     Activities of Daily Living In your present state of health, do you have any difficulty performing the following activities: 07/30/2019  Hearing? N  Vision? N  Difficulty concentrating or making decisions? N  Walking or climbing stairs? N  Dressing or bathing? N  Doing errands, shopping? N  Preparing Food and eating ? N  Using the Toilet? N  In the past six months, have you accidently leaked urine? N  Do you have problems with loss of bowel control? N  Managing your Medications? N  Managing your Finances? N  Housekeeping or managing your Housekeeping? N  Some recent data might be hidden    Patient Care Team: Shannon Sou, MD as PCP - General (Family Medicine) Shannon Cantor, MD as Consulting Physician (Ophthalmology)    Assessment:   This is a routine wellness examination for Shannon Greene.  Exercise Activities and Dietary recommendations Current Exercise Habits: The patient does not participate in regular exercise at present, Exercise limited by: None identified   Diet (meal preparation, eat out, water intake, caffeinated beverages, dairy products, fruits and vegetables): Drinks water, tea and gatorade.   Breakfast: toast and tea Lunch: tomato sandwich, salad Dinner: protein and veggies     Goals      Patient Stated   . <enter goal here> (pt-stated)     Maintain current  health.       Other   . Patient Stated     Maintain weight by staying active and controlling carb intake.     . Patient Stated     Maintain current health.        Fall Risk Fall Risk  07/30/2019 01/13/2019 10/16/2018 07/04/2018 06/24/2017  Falls in the past year? 0 0 0 No No  Number falls in past yr: 0 - - - -  Injury with Fall? 0 - - - -  Follow up Falls  prevention discussed - - - -    Depression Screen PHQ 2/9 Scores 07/30/2019 01/13/2019 07/04/2018 06/24/2017  PHQ - 2 Score 0 0 0 0  Exception Documentation - - - -     Cognitive Function MMSE - Mini Mental State Exam 07/30/2019 07/04/2018  Orientation to time 5 5  Orientation to Place 5 5  Registration 3 3  Attention/ Calculation 5 5  Recall 3 3  Language- name 2 objects 2 2  Language- repeat 1 1  Language- follow 3 step command 3 3  Language- read & follow direction 1 1  Write a sentence 1 1  Copy design 1 1  Total score 30 30        Immunization History  Administered Date(s) Administered  . Fluad Quad(high Dose 65+) 07/14/2019  . Influenza Whole 07/06/2012  . Influenza, High Dose Seasonal PF 07/19/2014, 07/28/2015, 08/05/2017, 07/15/2018  . Influenza,inj,Quad PF,6+ Mos 07/31/2016  . Pneumococcal Conjugate-13 12/16/2015  . Pneumococcal-Unspecified 11/05/2001  . Td 12/24/2016  . Zoster 06/21/2016  . Zoster Recombinat (Shingrix) 03/29/2017, 08/12/2017     Screening Tests Health Maintenance  Topic Date Due  . OPHTHALMOLOGY EXAM  01/10/2019  . DEXA SCAN  06/29/2019  . HEMOGLOBIN A1C  10/21/2019  . FOOT EXAM  07/13/2020  . INFLUENZA VACCINE  Completed  . PNA vac Low Risk Adult  Completed        Plan:     Schedule bone scan with mammogram in February 2021.   Continue doing brain stimulating activities (puzzles, reading, adult coloring books, staying active) to keep memory sharp.   I have personally reviewed and noted the following in the patient's chart:   . Medical and social history . Use of  alcohol, tobacco or illicit drugs  . Current medications and supplements . Functional ability and status . Nutritional status . Physical activity . Advanced directives . List of other physicians . Hospitalizations, surgeries, and ER visits in previous 12 months . Vitals . Screenings to include cognitive, depression, and falls . Referrals and appointments  In addition, I have reviewed and discussed with patient certain preventive protocols, quality metrics, and best practice recommendations. A written personalized care plan for preventive services as well as general preventive health recommendations were provided to patient.     Gerilyn Nestle, RN  07/30/2019  F/U with PCP 10/2019

## 2019-07-30 NOTE — Progress Notes (Signed)
Patient came in for a A1C check.

## 2019-07-30 NOTE — Patient Instructions (Addendum)
Schedule bone scan with mammogram in February 2021.   Continue doing brain stimulating activities (puzzles, reading, adult coloring books, staying active) to keep memory sharp.    Fall Prevention in the Home, Adult Falls can cause injuries. They can happen to people of all ages. There are many things you can do to make your home safe and to help prevent falls. Ask for help when making these changes, if needed. What actions can I take to prevent falls? General Instructions  Use good lighting in all rooms. Replace any light bulbs that burn out.  Turn on the lights when you go into a dark area. Use night-lights.  Keep items that you use often in easy-to-reach places. Lower the shelves around your home if necessary.  Set up your furniture so you have a clear path. Avoid moving your furniture around.  Do not have throw rugs and other things on the floor that can make you trip.  Avoid walking on wet floors.  If any of your floors are uneven, fix them.  Add color or contrast paint or tape to clearly mark and help you see: ? Any grab bars or handrails. ? First and last steps of stairways. ? Where the edge of each step is.  If you use a stepladder: ? Make sure that it is fully opened. Do not climb a closed stepladder. ? Make sure that both sides of the stepladder are locked into place. ? Ask someone to hold the stepladder for you while you use it.  If there are any pets around you, be aware of where they are. What can I do in the bathroom?      Keep the floor dry. Clean up any water that spills onto the floor as soon as it happens.  Remove soap buildup in the tub or shower regularly.  Use non-skid mats or decals on the floor of the tub or shower.  Attach bath mats securely with double-sided, non-slip rug tape.  If you need to sit down in the shower, use a plastic, non-slip stool.  Install grab bars by the toilet and in the tub and shower. Do not use towel bars as grab bars.  What can I do in the bedroom?  Make sure that you have a light by your bed that is easy to reach.  Do not use any sheets or blankets that are too big for your bed. They should not hang down onto the floor.  Have a firm chair that has side arms. You can use this for support while you get dressed. What can I do in the kitchen?  Clean up any spills right away.  If you need to reach something above you, use a strong step stool that has a grab bar.  Keep electrical cords out of the way.  Do not use floor polish or wax that makes floors slippery. If you must use wax, use non-skid floor wax. What can I do with my stairs?  Do not leave any items on the stairs.  Make sure that you have a light switch at the top of the stairs and the bottom of the stairs. If you do not have them, ask someone to add them for you.  Make sure that there are handrails on both sides of the stairs, and use them. Fix handrails that are broken or loose. Make sure that handrails are as long as the stairways.  Install non-slip stair treads on all stairs in your home.  Avoid having throw  rugs at the top or bottom of the stairs. If you do have throw rugs, attach them to the floor with carpet tape.  Choose a carpet that does not hide the edge of the steps on the stairway.  Check any carpeting to make sure that it is firmly attached to the stairs. Fix any carpet that is loose or worn. What can I do on the outside of my home?  Use bright outdoor lighting.  Regularly fix the edges of walkways and driveways and fix any cracks.  Remove anything that might make you trip as you walk through a door, such as a raised step or threshold.  Trim any bushes or trees on the path to your home.  Regularly check to see if handrails are loose or broken. Make sure that both sides of any steps have handrails.  Install guardrails along the edges of any raised decks and porches.  Clear walking paths of anything that might make  someone trip, such as tools or rocks.  Have any leaves, snow, or ice cleared regularly.  Use sand or salt on walking paths during winter.  Clean up any spills in your garage right away. This includes grease or oil spills. What other actions can I take?  Wear shoes that: ? Have a low heel. Do not wear high heels. ? Have rubber bottoms. ? Are comfortable and fit you well. ? Are closed at the toe. Do not wear open-toe sandals.  Use tools that help you move around (mobility aids) if they are needed. These include: ? Canes. ? Walkers. ? Scooters. ? Crutches.  Review your medicines with your doctor. Some medicines can make you feel dizzy. This can increase your chance of falling. Ask your doctor what other things you can do to help prevent falls. Where to find more information  Centers for Disease Control and Prevention, STEADI: https://garcia.biz/  [employer] on Aging: BrainJudge.co.uk Contact a doctor if:  You are afraid of falling at home.  You feel weak, drowsy, or dizzy at home.  You fall at home. Summary  There are many simple things that you can do to make your home safe and to help prevent falls.  Ways to make your home safe include removing tripping hazards and installing grab bars in the bathroom.  Ask for help when making these changes in your home. This information is not intended to replace advice given to you by your health care provider. Make sure you discuss any questions you have with your health care provider. Document Released: 08/18/2009 Document Revised: 02/12/2019 Document Reviewed: 06/06/2017 Elsevier Patient Education  2020 [place] Maintenance, Female Adopting a healthy lifestyle and getting preventive care are important in promoting health and wellness. Ask your health care provider about:  The right schedule for you to have regular tests and exams.  Things you can do on your own to prevent diseases and keep  yourself healthy. What should I know about diet, weight, and exercise? Eat a healthy diet   Eat a diet that includes plenty of vegetables, fruits, low-fat dairy products, and lean protein.  Do not eat a lot of foods that are high in solid fats, added sugars, or sodium. Maintain a healthy weight Body mass index (BMI) is used to identify weight problems. It estimates body fat based on height and weight. Your health care provider can help determine your BMI and help you achieve or maintain a healthy weight. Get regular exercise Get regular exercise. This is  one of the most important things you can do for your health. Most adults should:  Exercise for at least 150 minutes each week. The exercise should increase your heart rate and make you sweat (moderate-intensity exercise).  Do strengthening exercises at least twice a week. This is in addition to the moderate-intensity exercise.  Spend less time sitting. Even light physical activity can be beneficial. Watch cholesterol and blood lipids Have your blood tested for lipids and cholesterol at 83 years of age, then have this test every 5 years. Have your cholesterol levels checked more often if:  Your lipid or cholesterol levels are high.  You are older than 83 years of age.  You are at high risk for heart disease. What should I know about cancer screening? Depending on your health history and family history, you may need to have cancer screening at various ages. This may include screening for:  Breast cancer.  Cervical cancer.  Colorectal cancer.  Skin cancer.  Lung cancer. What should I know about heart disease, diabetes, and high blood pressure? Blood pressure and heart disease  High blood pressure causes heart disease and increases the risk of stroke. This is more likely to develop in people who have high blood pressure readings, are of African descent, or are overweight.  Have your blood pressure checked: ? Every 3-5 years  if you are 17-47 years of age. ? Every year if you are 15 years old or older. Diabetes Have regular diabetes screenings. This checks your fasting blood sugar level. Have the screening done:  Once every three years after age 64 if you are at a normal weight and have a low risk for diabetes.  More often and at a younger age if you are overweight or have a high risk for diabetes. What should I know about preventing infection? Hepatitis B If you have a higher risk for hepatitis B, you should be screened for this virus. Talk with your health care provider to find out if you are at risk for hepatitis B infection. Hepatitis C Testing is recommended for:  Everyone born from [birth year] through 1963.  Anyone with known risk factors for hepatitis C. Sexually transmitted infections (STIs)  Get screened for STIs, including gonorrhea and chlamydia, if: ? You are sexually active and are younger than 83 years of age. ? You are older than 83 years of age and your health care provider tells you that you are at risk for this type of infection. ? Your sexual activity has changed since you were last screened, and you are at increased risk for chlamydia or gonorrhea. Ask your health care provider if you are at risk.  Ask your health care provider about whether you are at high risk for HIV. Your health care provider may recommend a prescription medicine to help prevent HIV infection. If you choose to take medicine to prevent HIV, you should first get tested for HIV. You should then be tested every 3 months for as long as you are taking the medicine. Pregnancy  If you are about to stop having your period (premenopausal) and you may become pregnant, seek counseling before you get pregnant.  Take 400 to 800 micrograms (mcg) of folic acid every day if you become pregnant.  Ask for birth control (contraception) if you want to prevent pregnancy. Osteoporosis and menopause Osteoporosis is a disease in which the bones  lose minerals and strength with aging. This can result in bone fractures. If you are 69 years old or  older, or if you are at risk for osteoporosis and fractures, ask your health care provider if you should:  Be screened for bone loss.  Take a calcium or vitamin D supplement to lower your risk of fractures.  Be given hormone replacement therapy (HRT) to treat symptoms of menopause. Follow these instructions at home: Lifestyle  Do not use any products that contain nicotine or tobacco, such as cigarettes, e-cigarettes, and chewing tobacco. If you need help quitting, ask your health care provider.  Do not use street drugs.  Do not share needles.  Ask your health care provider for help if you need support or information about quitting drugs. Alcohol use  Do not drink alcohol if: ? Your health care provider tells you not to drink. ? You are pregnant, may be pregnant, or are planning to become pregnant.  If you drink alcohol: ? Limit how much you use to 0-1 drink a day. ? Limit intake if you are breastfeeding.  Be aware of how much alcohol is in your drink. In the [place] one drink equals one 12 oz bottle of beer (355 mL), one 5 oz glass of wine (148 mL), or one 1 oz glass of hard liquor (44 mL). General instructions  Schedule regular health, dental, and eye exams.  Stay current with your vaccines.  Tell your health care provider if: ? You often feel depressed. ? You have ever been abused or do not feel safe at home. Summary  Adopting a healthy lifestyle and getting preventive care are important in promoting health and wellness.  Follow your health care provider's instructions about healthy diet, exercising, and getting tested or screened for diseases.  Follow your health care provider's instructions on monitoring your cholesterol and blood pressure. This information is not intended to replace advice given to you by your health care provider. Make sure you discuss any questions  you have with your health care provider. Document Released: 05/07/2011 Document Revised: 10/15/2018 Document Reviewed: 10/15/2018 Elsevier Patient Education  2020 [employer].

## 2019-07-31 ENCOUNTER — Other Ambulatory Visit: Payer: Self-pay | Admitting: Family Medicine

## 2019-08-03 DIAGNOSIS — H353132 Nonexudative age-related macular degeneration, bilateral, intermediate dry stage: Secondary | ICD-10-CM | POA: Diagnosis not present

## 2019-08-05 NOTE — Progress Notes (Signed)
AWV reviewed and agree. Signed:  Phil Shunda Rabadi, MD           08/05/2019  

## 2019-08-08 NOTE — Progress Notes (Signed)
AWV reviewed and agree. Signed:  Phil McGowen, MD           08/08/2019  

## 2019-08-12 ENCOUNTER — Other Ambulatory Visit: Payer: Self-pay | Admitting: Family Medicine

## 2019-09-01 ENCOUNTER — Encounter: Payer: Self-pay | Admitting: Family Medicine

## 2019-09-02 DIAGNOSIS — H353132 Nonexudative age-related macular degeneration, bilateral, intermediate dry stage: Secondary | ICD-10-CM | POA: Diagnosis not present

## 2019-10-02 DIAGNOSIS — H353132 Nonexudative age-related macular degeneration, bilateral, intermediate dry stage: Secondary | ICD-10-CM | POA: Diagnosis not present

## 2019-10-08 ENCOUNTER — Other Ambulatory Visit: Payer: Self-pay | Admitting: Family Medicine

## 2019-10-08 DIAGNOSIS — Z1231 Encounter for screening mammogram for malignant neoplasm of breast: Secondary | ICD-10-CM

## 2019-10-12 ENCOUNTER — Other Ambulatory Visit: Payer: Self-pay

## 2019-10-12 ENCOUNTER — Ambulatory Visit (INDEPENDENT_AMBULATORY_CARE_PROVIDER_SITE_OTHER)
Admission: RE | Admit: 2019-10-12 | Discharge: 2019-10-12 | Disposition: A | Discharge status: Home / Self Care | Payer: Medicare HMO | Source: Ambulatory Visit | Attending: Family Medicine | Admitting: Family Medicine

## 2019-10-12 DIAGNOSIS — Z13820 Encounter for screening for osteoporosis: Secondary | ICD-10-CM | POA: Diagnosis not present

## 2019-10-13 ENCOUNTER — Encounter: Payer: Self-pay | Admitting: Family Medicine

## 2019-10-13 ENCOUNTER — Ambulatory Visit (INDEPENDENT_AMBULATORY_CARE_PROVIDER_SITE_OTHER): Payer: Medicare HMO | Admitting: Family Medicine

## 2019-10-13 VITALS — BP 127/69 | HR 81 | Temp 98.0°F | Resp 16 | Ht 66.0 in | Wt 165.0 lb

## 2019-10-13 DIAGNOSIS — E7800 Pure hypercholesterolemia, unspecified: Secondary | ICD-10-CM

## 2019-10-13 DIAGNOSIS — Z13820 Encounter for screening for osteoporosis: Secondary | ICD-10-CM

## 2019-10-13 DIAGNOSIS — I10 Essential (primary) hypertension: Secondary | ICD-10-CM | POA: Diagnosis not present

## 2019-10-13 DIAGNOSIS — E119 Type 2 diabetes mellitus without complications: Secondary | ICD-10-CM | POA: Diagnosis not present

## 2019-10-13 DIAGNOSIS — E039 Hypothyroidism, unspecified: Secondary | ICD-10-CM | POA: Diagnosis not present

## 2019-10-13 NOTE — Patient Instructions (Signed)
Buy Vit D3 tabs->1000 Unit tabs->take one every day. Take 3 tums every day.

## 2019-10-13 NOTE — Progress Notes (Addendum)
OFFICE VISIT  10/13/2019   CC:  Chief Complaint  Patient presents with  . Follow-up    [name], pt is not fasting   HPI:    Patient is a 85 y.o. Caucasian female who presents for 3 mo f/u DM 2, HTN, and hypothyroidism. Reviewed labs from 3 mo ago again in detail. Feeling well. Very active, eating well.  DM: no meds/diet only.  A1c 3 mo age improved to 6.8%.  HTN: home bp's 120s/60s-70s.  HLD: taking atorva daily, no side effects.  Hypothyroidism: takes thyroid med on empty stomach.  ROS: no CP, no SOB, no wheezing, no cough, no dizziness, no HAs, no rashes, no melena/hematochezia.  No polyuria or polydipsia.  No myalgias or arthralgias.   Past Medical History:  Diagnosis Date  . Corneal dystrophies, hereditary   . Diabetes mellitus 10/2010   A1c 6.7%   . Diverticulosis   . Dry eye syndrome   . Hyperlipemia   . Hypertension   . Hypothyroidism   . Lumbar spondylosis 05/2016   x-ray  . Macular degeneration, bilateral   . Osteoarthritis   . Presbyopia   . Transfusion history 1959   post partum    Past Surgical History:  Procedure Laterality Date  . APPENDECTOMY  1959  . CATARACT EXTRACTION, BILATERAL     Dr Bing Plume  . CESAREAN SECTION  1957; 61  . COLECTOMY     18 inches removed 2002-2003 for Diverticulitis  . COLONOSCOPY     Tics,Dr Sam [place]--pt declines any further screening as of 12/2015.  Marland Kitchen DEXA  06/30/2016   T score 2.5.  Repeat 3 yrs.  Marland Kitchen VAGINAL HYSTERECTOMY  1975   For Fibroids (no BSO)    Outpatient Medications Prior to Visit  Medication Sig Dispense Refill  . atorvastatin (LIPITOR) 40 MG tablet TAKE 1 TABLET EVERY DAY 90 tablet 1  . cycloSPORINE (RESTASIS) 0.05 % ophthalmic emulsion Place 1 drop into both eyes 2 (two) times daily.      . hydrochlorothiazide (HYDRODIURIL) 12.5 MG tablet TAKE 1 TABLET EVERY DAY IN PLACE OF METHYCLOTHIAZIDE 90 tablet 1  . levothyroxine (SYNTHROID, LEVOTHROID) 50 MCG tablet TAKE 1 TABLET EVERY DAY  EXCEPT TAKE 1/2  TABLET ON TUESDAY AND SATURDAY 78 tablet 1  . ramipril (ALTACE) 5 MG capsule TAKE 1 CAPSULE EVERY DAY 90 capsule 1   No facility-administered medications prior to visit.     Allergies  Allergen Reactions  . Doxycycline Hyclate Diarrhea  . Penicillins     Rash Because of a history of documented adverse serious drug reaction;Medi Alert bracelet  is recommended  . Tetanus Toxoid     REACTION: Arm swelling-severe Because of a history of documented adverse serious drug reaction;Medi Alert bracelet  is recommended  . Metformin And Related Nausea And Vomiting and Other (See Comments)    Abdominal pain    ROS As per HPI  PE: Blood pressure 127/69, pulse 81, temperature 98 F (36.7 C), temperature source Temporal, resp. rate 16, height 5\' 6"  (1.676 m), weight 165 lb (74.8 kg), SpO2 95 %. Body mass index is 26.63 kg/m.  Gen: Alert, well appearing.  Patient is oriented to person, place, time, and situation. AFFECT: pleasant, lucid thought and speech. CV: RRR, no m/r/g.   LUNGS: CTA bilat, nonlabored resps, good aeration in all lung fields. EXT: no clubbing or cyanosis.  no edema.    LABS:  Lab Results  Component Value Date   TSH 0.24 (L) 01/13/2019   Lab Results  Component Value Date   WBC 5.1 12/24/2016   HGB 14.7 12/24/2016   HCT 44.0 12/24/2016   MCV 92.8 12/24/2016   PLT 208.0 12/24/2016   Lab Results  Component Value Date   CREATININE 0.69 07/14/2019   BUN 20 07/14/2019   NA 141 07/14/2019   K 4.1 07/14/2019   CL 100 07/14/2019   CO2 32 07/14/2019   Lab Results  Component Value Date   ALT 19 01/13/2019   AST 19 01/13/2019   ALKPHOS 68 01/13/2019   BILITOT 0.9 01/13/2019   Lab Results  Component Value Date   CHOL 167 01/13/2019   Lab Results  Component Value Date   HDL 60.80 01/13/2019   Lab Results  Component Value Date   LDLCALC 86 01/13/2019   Lab Results  Component Value Date   TRIG 100.0 01/13/2019   Lab Results  Component Value Date    CHOLHDL 3 01/13/2019   Lab Results  Component Value Date   HGBA1C 6.8 (A) 07/30/2019   HGBA1C 6.8 07/30/2019   HGBA1C 6.8 (A) 07/30/2019   HGBA1C 6.8 07/30/2019    IMPRESSION AND PLAN:  1) HTN; The current medical regimen is effective;  continue present plan and medications. Repeat BMET 3 mo.  2) Hyperlipidemia: tolerating statin. Repeat FLP and hepatic panel in 3 mo.  3) DM 2, diet controlled. Plan repeat A1c and urine microalb/cr at next f/u.  4) Osteoporosis screening: DEXA was done yesterday, result pending. She needs to start 1500 mg calcium qd and [phone] U vit D daily.  5) Hypothyroidism: taking T4 correctly. Plan for repeat TSH 3 mo.  An After Visit Summary was printed and given to the patient.  FOLLOW UP: Return in about 3 months (around 01/11/2020) for annual CPE (fasting).  Signed:  Crissie Sickles, MD           10/13/2019

## 2019-10-15 ENCOUNTER — Encounter: Payer: Self-pay | Admitting: Family Medicine

## 2019-10-19 ENCOUNTER — Encounter: Payer: Self-pay | Admitting: Family Medicine

## 2019-10-19 ENCOUNTER — Ambulatory Visit (INDEPENDENT_AMBULATORY_CARE_PROVIDER_SITE_OTHER): Payer: Medicare HMO | Admitting: Family Medicine

## 2019-10-19 ENCOUNTER — Other Ambulatory Visit: Payer: Self-pay

## 2019-10-19 ENCOUNTER — Telehealth: Payer: Self-pay | Admitting: Family Medicine

## 2019-10-19 VITALS — BP 172/86 | HR 80 | Temp 98.3°F

## 2019-10-19 DIAGNOSIS — N3000 Acute cystitis without hematuria: Secondary | ICD-10-CM

## 2019-10-19 DIAGNOSIS — R309 Painful micturition, unspecified: Secondary | ICD-10-CM

## 2019-10-19 LAB — POCT URINALYSIS DIPSTICK
Glucose, UA: NEGATIVE
Ketones, UA: NEGATIVE
Nitrite, UA: POSITIVE
Protein, UA: NEGATIVE
Spec Grav, UA: 1.015 (ref 1.010–1.025)
Urobilinogen, UA: 2 E.U./dL — AB
pH, UA: 5.5 (ref 5.0–8.0)

## 2019-10-19 MED ORDER — CIPROFLOXACIN HCL 500 MG PO TABS: 500.0000 mg | ORAL_TABLET | Freq: Two times a day (BID) | ORAL | 0 refills | Status: AC

## 2019-10-19 NOTE — Telephone Encounter (Signed)
Patient has been scheduled

## 2019-10-19 NOTE — Telephone Encounter (Signed)
PCP given verbal okay for work in. Pt notified, currently working on scheduling.

## 2019-10-19 NOTE — Progress Notes (Signed)
OFFICE VISIT  10/19/2019   CC:  Chief Complaint  Patient presents with  . Painful urination    pressure, since yesterday   HPI:    Patient is a 85 y.o. Caucasian female who presents for urinary complaints. Onset yesterday, burning with urination, pressure over bladder, urgency, frequency. No nausea, fever, or blood in urine.  No fever.  No flank or CVA pain. Cipro always helps, although fortunately UTI's have not been a frequent problem for her.   Past Medical History:  Diagnosis Date  . Corneal dystrophies, hereditary   . Diabetes mellitus 10/2010   A1c 6.7%   . Diverticulosis   . Dry eye syndrome   . Hyperlipemia   . Hypertension   . Hypothyroidism   . Lumbar spondylosis 05/2016   x-ray  . Macular degeneration, bilateral   . Osteoarthritis   . Presbyopia   . Transfusion history 1959   post partum    Past Surgical History:  Procedure Laterality Date  . APPENDECTOMY  1959  . CATARACT EXTRACTION, BILATERAL     Dr Bing Plume  . CESAREAN SECTION  1957; 38  . COLECTOMY     18 inches removed 2002-2003 for Diverticulitis  . COLONOSCOPY     Tics,Dr Sam [place]--pt declines any further screening as of 12/2015.  Marland Kitchen DEXA  06/30/2016;10/13/19   2017 T score 2.5.  Rpt 2020 NORMAL. Rpt 2 yrs  . VAGINAL HYSTERECTOMY  1975   For Fibroids (no BSO)    Outpatient Medications Prior to Visit  Medication Sig Dispense Refill  . atorvastatin (LIPITOR) 40 MG tablet TAKE 1 TABLET EVERY DAY 90 tablet 1  . cycloSPORINE (RESTASIS) 0.05 % ophthalmic emulsion Place 1 drop into both eyes 2 (two) times daily.      . hydrochlorothiazide (HYDRODIURIL) 12.5 MG tablet TAKE 1 TABLET EVERY DAY IN PLACE OF METHYCLOTHIAZIDE 90 tablet 1  . levothyroxine (SYNTHROID, LEVOTHROID) 50 MCG tablet TAKE 1 TABLET EVERY DAY  EXCEPT TAKE 1/2 TABLET ON TUESDAY AND SATURDAY 78 tablet 1  . ramipril (ALTACE) 5 MG capsule TAKE 1 CAPSULE EVERY DAY 90 capsule 1   No facility-administered medications prior to visit.     Allergies  Allergen Reactions  . Doxycycline Hyclate Diarrhea  . Penicillins     Rash Because of a history of documented adverse serious drug reaction;Medi Alert bracelet  is recommended  . Tetanus Toxoid     REACTION: Arm swelling-severe Because of a history of documented adverse serious drug reaction;Medi Alert bracelet  is recommended  . Metformin And Related Nausea And Vomiting and Other (See Comments)    Abdominal pain    ROS As per HPI  PE: Blood pressure (!) 172/86, pulse 80, temperature 98.3 F (36.8 C), temperature source Temporal, SpO2 96 %. Gen: Alert, well appearing.  Patient is oriented to person, place, time, and situation. AFFECT: pleasant, lucid thought and speech. No further exam today.  LABS:   UA today: AZO use--color orange->uninterpretable.  IMPRESSION AND PLAN:  Acute UTI: cipro 500 bid x 3-5d.  Sent urine for c/s.  An After Visit Summary was printed and given to the patient.  FOLLOW UP: Return if symptoms worsen or fail to improve.  Signed:  Crissie Sickles, MD           10/19/2019

## 2019-10-19 NOTE — Telephone Encounter (Signed)
Patient called in with painful urgent urination. Patient request same day appt. Please call.

## 2019-10-22 ENCOUNTER — Other Ambulatory Visit: Payer: Self-pay | Admitting: Family Medicine

## 2019-10-22 LAB — URINE CULTURE
MICRO NUMBER:: 1194914
SPECIMEN QUALITY:: ADEQUATE

## 2019-10-22 MED ORDER — NITROFURANTOIN MONOHYD MACRO 100 MG PO CAPS: 100.0000 mg | ORAL_CAPSULE | Freq: Two times a day (BID) | ORAL | 0 refills | Status: AC

## 2019-10-29 ENCOUNTER — Other Ambulatory Visit: Payer: Self-pay | Admitting: Family Medicine

## 2019-11-01 DIAGNOSIS — H353132 Nonexudative age-related macular degeneration, bilateral, intermediate dry stage: Secondary | ICD-10-CM | POA: Diagnosis not present

## 2019-12-01 DIAGNOSIS — H353132 Nonexudative age-related macular degeneration, bilateral, intermediate dry stage: Secondary | ICD-10-CM | POA: Diagnosis not present

## 2019-12-16 ENCOUNTER — Other Ambulatory Visit: Payer: Self-pay | Admitting: Family Medicine

## 2019-12-17 ENCOUNTER — Ambulatory Visit: Payer: Medicare HMO | Attending: Internal Medicine

## 2019-12-17 DIAGNOSIS — Z23 Encounter for immunization: Secondary | ICD-10-CM

## 2019-12-17 NOTE — Progress Notes (Signed)
   Covid-19 Vaccination Clinic  Name:  Shannon Greene    MRN: [id number] DOB: June 21, 1934  12/17/2019  [name] was observed post Covid-19 immunization for 15 minutes without incidence. She was provided with Vaccine Information Sheet and instruction to access the V-Safe system.   Ms. Stump was instructed to call 911 with any severe reactions post vaccine: Marland Kitchen Difficulty breathing  . Swelling of your face and throat  . A fast heartbeat  . A bad rash all over your body  . Dizziness and weakness    Immunizations Administered    Name Date Dose VIS Date Route   Pfizer COVID-19 Vaccine 12/17/2019  2:07 PM 0.3 mL 10/16/2019 Intramuscular   Manufacturer: [place]   Lot: [id number]   [place]: [id number]

## 2019-12-28 ENCOUNTER — Ambulatory Visit
Admission: RE | Admit: 2019-12-28 | Discharge: 2019-12-28 | Disposition: A | Discharge status: Home / Self Care | Payer: Medicare HMO | Source: Ambulatory Visit | Attending: Family Medicine | Admitting: Family Medicine

## 2019-12-28 ENCOUNTER — Other Ambulatory Visit: Payer: Self-pay

## 2019-12-28 DIAGNOSIS — Z1231 Encounter for screening mammogram for malignant neoplasm of breast: Secondary | ICD-10-CM | POA: Diagnosis not present

## 2019-12-31 DIAGNOSIS — H353132 Nonexudative age-related macular degeneration, bilateral, intermediate dry stage: Secondary | ICD-10-CM | POA: Diagnosis not present

## 2020-01-05 ENCOUNTER — Other Ambulatory Visit: Payer: Self-pay | Admitting: Family Medicine

## 2020-01-09 ENCOUNTER — Ambulatory Visit: Payer: Medicare HMO | Attending: Internal Medicine

## 2020-01-09 DIAGNOSIS — Z23 Encounter for immunization: Secondary | ICD-10-CM | POA: Insufficient documentation

## 2020-01-09 NOTE — Progress Notes (Signed)
   Covid-19 Vaccination Clinic  Name:  Selam[place]it Ebel    MRN: [id number] DOB: November 17, 1933  01/09/2020  Ms. Rabin was observed post Covid-19 immunization for 15 minutes without incident. She was provided with Vaccine Information Sheet and instruction to access the V-Safe system.   Ms. Kuyper was instructed to call 911 with any severe reactions post vaccine: Marland Kitchen Difficulty breathing  . Swelling of face and throat  . A fast heartbeat  . A bad rash all over body  . Dizziness and weakness   Immunizations Administered    Name Date Dose VIS Date Route   Pfizer COVID-19 Vaccine 01/09/2020 12:43 PM 0.3 mL 10/16/2019 Intramuscular   Manufacturer: [place]   Lot: [id number]   [place]: [id number]

## 2020-01-19 ENCOUNTER — Encounter: Payer: Self-pay | Admitting: Family Medicine

## 2020-01-19 ENCOUNTER — Other Ambulatory Visit: Payer: Self-pay

## 2020-01-19 ENCOUNTER — Ambulatory Visit (INDEPENDENT_AMBULATORY_CARE_PROVIDER_SITE_OTHER): Payer: Medicare HMO | Admitting: Family Medicine

## 2020-01-19 VITALS — BP 144/77 | HR 73 | Temp 97.6°F | Resp 16 | Ht 66.0 in | Wt 166.6 lb

## 2020-01-19 DIAGNOSIS — I10 Essential (primary) hypertension: Secondary | ICD-10-CM | POA: Diagnosis not present

## 2020-01-19 DIAGNOSIS — E119 Type 2 diabetes mellitus without complications: Secondary | ICD-10-CM | POA: Diagnosis not present

## 2020-01-19 DIAGNOSIS — E7800 Pure hypercholesterolemia, unspecified: Secondary | ICD-10-CM

## 2020-01-19 DIAGNOSIS — Z0000 Encounter for general adult medical examination without abnormal findings: Secondary | ICD-10-CM | POA: Diagnosis not present

## 2020-01-19 DIAGNOSIS — E039 Hypothyroidism, unspecified: Secondary | ICD-10-CM

## 2020-01-19 LAB — CBC WITH DIFFERENTIAL/PLATELET
Basophils Absolute: 0 10*3/uL (ref 0.0–0.1)
Basophils Relative: 0.8 % (ref 0.0–3.0)
Eosinophils Absolute: 0.1 10*3/uL (ref 0.0–0.7)
Eosinophils Relative: 1.5 % (ref 0.0–5.0)
HCT: 42.6 % (ref 36.0–46.0)
Hemoglobin: 14.2 g/dL (ref 12.0–15.0)
Lymphocytes Relative: 32.2 % (ref 12.0–46.0)
Lymphs Abs: 1.8 10*3/uL (ref 0.7–4.0)
MCHC: 33.4 g/dL (ref 30.0–36.0)
MCV: 91.9 fl (ref 78.0–100.0)
Monocytes Absolute: 0.4 10*3/uL (ref 0.1–1.0)
Monocytes Relative: 7.3 % (ref 3.0–12.0)
Neutro Abs: 3.3 10*3/uL (ref 1.4–7.7)
Neutrophils Relative %: 58.2 % (ref 43.0–77.0)
Platelets: 214 10*3/uL (ref 150.0–400.0)
RBC: 4.63 Mil/uL (ref 3.87–5.11)
RDW: 13 % (ref 11.5–15.5)
WBC: 5.6 10*3/uL (ref 4.0–10.5)

## 2020-01-19 LAB — COMPREHENSIVE METABOLIC PANEL
ALT: 20 U/L (ref 0–35)
AST: 18 U/L (ref 0–37)
Albumin: 3.6 g/dL (ref 3.5–5.2)
Alkaline Phosphatase: 72 U/L (ref 39–117)
BUN: 18 mg/dL (ref 6–23)
CO2: 33 mEq/L — ABNORMAL HIGH (ref 19–32)
Calcium: 9.1 mg/dL (ref 8.4–10.5)
Chloride: 103 mEq/L (ref 96–112)
Creatinine, Ser: 0.72 mg/dL (ref 0.40–1.20)
GFR: 77.23 mL/min (ref >60.00)
Glucose, Bld: 133 mg/dL — ABNORMAL HIGH (ref 70–99)
Potassium: 4 mEq/L (ref 3.5–5.1)
Sodium: 142 mEq/L (ref 135–145)
Total Bilirubin: 0.7 mg/dL (ref 0.2–1.2)
Total Protein: 6.2 g/dL (ref 6.0–8.3)

## 2020-01-19 LAB — LIPID PANEL
Cholesterol: 159 mg/dL (ref 0–200)
HDL: 49.6 mg/dL (ref >39.00)
LDL Cholesterol: 87 mg/dL (ref 0–99)
NonHDL: 109.17
Total CHOL/HDL Ratio: 3
Triglycerides: 112 mg/dL (ref 0.0–149.0)
VLDL: 22.4 mg/dL (ref 0.0–40.0)

## 2020-01-19 LAB — TSH: TSH: 0.8 u[IU]/mL (ref 0.35–4.50)

## 2020-01-19 LAB — HEMOGLOBIN A1C: Hgb A1c MFr Bld: 7.4 % — ABNORMAL HIGH (ref 4.6–6.5)

## 2020-01-19 NOTE — Patient Instructions (Signed)
Health Maintenance, Female Adopting a healthy lifestyle and getting preventive care are important in promoting health and wellness. Ask your health care provider about:  The right schedule for you to have regular tests and exams.  Things you can do on your own to prevent diseases and keep yourself healthy. What should I know about diet, weight, and exercise? Eat a healthy diet   Eat a diet that includes plenty of vegetables, fruits, low-fat dairy products, and lean protein.  Do not eat a lot of foods that are high in solid fats, added sugars, or sodium. Maintain a healthy weight Body mass index (BMI) is used to identify weight problems. It estimates body fat based on height and weight. Your health care provider can help determine your BMI and help you achieve or maintain a healthy weight. Get regular exercise Get regular exercise. This is one of the most important things you can do for your health. Most adults should:  Exercise for at least 150 minutes each week. The exercise should increase your heart rate and make you sweat (moderate-intensity exercise).  Do strengthening exercises at least twice a week. This is in addition to the moderate-intensity exercise.  Spend less time sitting. Even light physical activity can be beneficial. Watch cholesterol and blood lipids Have your blood tested for lipids and cholesterol at 84 years of age, then have this test every 5 years. Have your cholesterol levels checked more often if:  Your lipid or cholesterol levels are high.  You are older than 84 years of age.  You are at high risk for heart disease. What should I know about cancer screening? Depending on your health history and family history, you may need to have cancer screening at various ages. This may include screening for:  Breast cancer.  Cervical cancer.  Colorectal cancer.  Skin cancer.  Lung cancer. What should I know about heart disease, diabetes, and high blood  pressure? Blood pressure and heart disease  High blood pressure causes heart disease and increases the risk of stroke. This is more likely to develop in people who have high blood pressure readings, are of African descent, or are overweight.  Have your blood pressure checked: ? Every 3-5 years if you are 18-39 years of age. ? Every year if you are 40 years old or older. Diabetes Have regular diabetes screenings. This checks your fasting blood sugar level. Have the screening done:  Once every three years after age 40 if you are at a normal weight and have a low risk for diabetes.  More often and at a younger age if you are overweight or have a high risk for diabetes. What should I know about preventing infection? Hepatitis B If you have a higher risk for hepatitis B, you should be screened for this virus. Talk with your health care provider to find out if you are at risk for hepatitis B infection. Hepatitis C Testing is recommended for:  Everyone born from 1945 through 1965.  Anyone with known risk factors for hepatitis C. Sexually transmitted infections (STIs)  Get screened for STIs, including gonorrhea and chlamydia, if: ? You are sexually active and are younger than 84 years of age. ? You are older than 84 years of age and your health care provider tells you that you are at risk for this type of infection. ? Your sexual activity has changed since you were last screened, and you are at increased risk for chlamydia or gonorrhea. Ask your health care provider if   you are at risk.  Ask your health care provider about whether you are at high risk for HIV. Your health care provider may recommend a prescription medicine to help prevent HIV infection. If you choose to take medicine to prevent HIV, you should first get tested for HIV. You should then be tested every 3 months for as long as you are taking the medicine. Pregnancy  If you are about to stop having your period (premenopausal) and  you may become pregnant, seek counseling before you get pregnant.  Take 400 to 800 micrograms (mcg) of folic acid every day if you become pregnant.  Ask for birth control (contraception) if you want to prevent pregnancy. Osteoporosis and menopause Osteoporosis is a disease in which the bones lose minerals and strength with aging. This can result in bone fractures. If you are 65 years old or older, or if you are at risk for osteoporosis and fractures, ask your health care provider if you should:  Be screened for bone loss.  Take a calcium or vitamin D supplement to lower your risk of fractures.  Be given hormone replacement therapy (HRT) to treat symptoms of menopause. Follow these instructions at home: Lifestyle  Do not use any products that contain nicotine or tobacco, such as cigarettes, e-cigarettes, and chewing tobacco. If you need help quitting, ask your health care provider.  Do not use street drugs.  Do not share needles.  Ask your health care provider for help if you need support or information about quitting drugs. Alcohol use  Do not drink alcohol if: ? Your health care provider tells you not to drink. ? You are pregnant, may be pregnant, or are planning to become pregnant.  If you drink alcohol: ? Limit how much you use to 0-1 drink a day. ? Limit intake if you are breastfeeding.  Be aware of how much alcohol is in your drink. In the [place] one drink equals one 12 oz bottle of beer (355 mL), one 5 oz glass of wine (148 mL), or one 1 oz glass of hard liquor (44 mL). General instructions  Schedule regular health, dental, and eye exams.  Stay current with your vaccines.  Tell your health care provider if: ? You often feel depressed. ? You have ever been abused or do not feel safe at home. Summary  Adopting a healthy lifestyle and getting preventive care are important in promoting health and wellness.  Follow your health care provider's instructions about healthy  diet, exercising, and getting tested or screened for diseases.  Follow your health care provider's instructions on monitoring your cholesterol and blood pressure. This information is not intended to replace advice given to you by your health care provider. Make sure you discuss any questions you have with your health care provider. Document Revised: 10/15/2018 Document Reviewed: 10/15/2018 Elsevier Patient Education  2020 [employer]  

## 2020-01-19 NOTE — Progress Notes (Signed)
Office Note 01/19/2020  CC:  Chief Complaint  Patient presents with  . Annual Exam    pt is fasting    HPI:  Shannon Greene is a 86 y.o. White female who is here for annual health maintenance exam. Got covid #2 01/09/20 and has felt signif fatigue after, letting up gradually.  No fever, no n/v/d, no myalgias.  No cough, SOB, or CP.  No rash.  Home bp's consistently 120s/60s. Thyroid med: takes on empty stomach by itself. Tolerating statin. Compliant with bp med.  She is very Animator. Diet pretty healthy.  Fasting gluc check about 1-2 times q 2 wks or so, 115-140 range.  Past Medical History:  Diagnosis Date  . Corneal dystrophies, hereditary   . Diabetes mellitus 10/2010   A1c 6.7%   . Diverticulosis   . Dry eye syndrome   . Hyperlipemia   . Hypertension   . Hypothyroidism   . Lumbar spondylosis 05/2016   x-ray  . Macular degeneration, bilateral   . Osteoarthritis   . Presbyopia   . Transfusion history 1959   post partum    Past Surgical History:  Procedure Laterality Date  . APPENDECTOMY  1959  . CATARACT EXTRACTION, BILATERAL     Dr Bing Plume  . CESAREAN SECTION  1957; 68  . COLECTOMY     18 inches removed 2002-2003 for Diverticulitis  . COLONOSCOPY     Tics,Dr Sam [place]--pt declines any further screening as of 12/2015.  Marland Kitchen DEXA  06/30/2016;10/13/19   2017 T score 2.5.  Rpt 2020 NORMAL. Rpt 2 yrs  . VAGINAL HYSTERECTOMY  1975   For Fibroids (no BSO)    Family History  Problem Relation Age of Onset  . Heart attack Mother 72  . Hypertension Mother   . Tuberculosis Father   . Diabetes Brother   . Stroke Brother 40  . Prostate cancer Brother   . Breast cancer Neg Hx     Social History   Socioeconomic History  . Marital status: Married    Spouse name: Not on file  . Number of children: Not on file  . Years of education: Not on file  . Highest education level: Not on file  Occupational History  . Not on file  Tobacco Use  .  Smoking status: Never Smoker  . Smokeless tobacco: Never Used  Substance and Sexual Activity  . Alcohol use: No  . Drug use: No  . Sexual activity: Not on file  Other Topics Concern  . Not on file  Social History Narrative   Married, 2 daughters.   Occupation: retired Facilities manager).   Silver sneakers.   No T/A/Ds.   Social Determinants of Health   Financial Resource Strain:   . Difficulty of Paying Living Expenses:   Food Insecurity:   . Worried About Charity fundraiser in the Last Year:   . Arboriculturist in the Last Year:   Transportation Needs:   . Film/video editor (Medical):   Marland Kitchen Lack of Transportation (Non-Medical):   Physical Activity:   . Days of Exercise per Week:   . Minutes of Exercise per Session:   Stress:   . Feeling of Stress :   Social Connections:   . Frequency of Communication with Friends and Family:   . Frequency of Social Gatherings with Friends and Family:   . Attends Religious Services:   . Active Member of Clubs or Organizations:   . Attends Archivist Meetings:   .  Marital Status:   Intimate Partner Violence:   . Fear of Current or Ex-Partner:   . Emotionally Abused:   Marland Kitchen Physically Abused:   . Sexually Abused:     Outpatient Medications Prior to Visit  Medication Sig Dispense Refill  . atorvastatin (LIPITOR) 40 MG tablet TAKE 1 TABLET EVERY DAY 90 tablet 1  . cycloSPORINE (RESTASIS) 0.05 % ophthalmic emulsion Place 1 drop into both eyes 2 (two) times daily.      . hydrochlorothiazide (HYDRODIURIL) 12.5 MG tablet TAKE 1 TABLET EVERY DAY IN PLACE OF METHYCLOTHIAZIDE 90 tablet 1  . levothyroxine (SYNTHROID) 50 MCG tablet TAKE 1 TABLET EVERY DAY  EXCEPT TAKE 1/2 TABLET ON TUESDAY AND SATURDAY 78 tablet 1  . ramipril (ALTACE) 5 MG capsule TAKE 1 CAPSULE EVERY DAY 90 capsule 1   No facility-administered medications prior to visit.    Allergies  Allergen Reactions  . Doxycycline Hyclate Diarrhea  . Penicillins      Rash Because of a history of documented adverse serious drug reaction;Medi Alert bracelet  is recommended  . Tetanus Toxoid     REACTION: Arm swelling-severe Because of a history of documented adverse serious drug reaction;Medi Alert bracelet  is recommended  . Metformin And Related Nausea And Vomiting and Other (See Comments)    Abdominal pain    Review of Systems  Constitutional: Positive for fatigue (since getting covid shot #2--gradually resolving). Negative for appetite change, chills and fever.  HENT: Negative for congestion, dental problem, ear pain and sore throat.   Eyes: Negative for discharge, redness and visual disturbance.  Respiratory: Negative for cough, chest tightness, shortness of breath and wheezing.   Cardiovascular: Negative for chest pain, palpitations and leg swelling.  Gastrointestinal: Negative for abdominal pain, blood in stool, diarrhea, nausea and vomiting.  Genitourinary: Negative for difficulty urinating, dysuria, flank pain, frequency, hematuria and urgency.  Musculoskeletal: Negative for arthralgias, back pain, joint swelling, myalgias and neck stiffness.  Skin: Negative for pallor and rash.  Neurological: Negative for dizziness, speech difficulty, weakness and headaches.  Hematological: Negative for adenopathy. Does not bruise/bleed easily.  Psychiatric/Behavioral: Negative for confusion and sleep disturbance. The patient is not nervous/anxious.     PE; Blood pressure (!) 144/77, pulse 73, temperature 97.6 F (36.4 C), temperature source Temporal, resp. rate 16, height 5\' 6"  (1.676 m), weight 166 lb 9.6 oz (75.6 kg), SpO2 95 %. Body mass index is 26.89 kg/m. Exam chaperoned by Deveron Furlong, CMA.  Gen: Alert, well appearing.  Patient is oriented to person, place, time, and situation. AFFECT: pleasant, lucid thought and speech. ENT: Ears: EACs clear, normal epithelium.  TMs with good light reflex and landmarks bilaterally.  Eyes: no injection,  icteris, swelling, or exudate.  EOMI, PERRLA. Nose: no drainage or turbinate edema/swelling.  No injection or focal lesion.  Mouth: lips without lesion/swelling.  Oral mucosa pink and moist.  Dentition intact and without obvious caries or gingival swelling.  Oropharynx without erythema, exudate, or swelling.  Neck: supple/nontender.  No LAD, mass, or TM.  Carotid pulses 2+ bilaterally, without bruits. CV: RRR, no m/r/g.   LUNGS: CTA bilat, nonlabored resps, good aeration in all lung fields. ABD: soft, NT, ND, BS normal.  No hepatospenomegaly or mass.  No bruits. EXT: no clubbing, cyanosis, or edema.  Musculoskeletal: no joint swelling, erythema, warmth, or tenderness.  ROM of all joints intact. Skin - no sores or suspicious lesions or rashes or color changes   Pertinent labs:  Lab Results  Component Value  Date   TSH 0.24 (L) 01/13/2019   Lab Results  Component Value Date   WBC 5.1 12/24/2016   HGB 14.7 12/24/2016   HCT 44.0 12/24/2016   MCV 92.8 12/24/2016   PLT 208.0 12/24/2016   Lab Results  Component Value Date   CREATININE 0.69 07/14/2019   BUN 20 07/14/2019   NA 141 07/14/2019   K 4.1 07/14/2019   CL 100 07/14/2019   CO2 32 07/14/2019   Lab Results  Component Value Date   ALT 19 01/13/2019   AST 19 01/13/2019   ALKPHOS 68 01/13/2019   BILITOT 0.9 01/13/2019   Lab Results  Component Value Date   CHOL 167 01/13/2019   Lab Results  Component Value Date   HDL 60.80 01/13/2019   Lab Results  Component Value Date   LDLCALC 86 01/13/2019   Lab Results  Component Value Date   TRIG 100.0 01/13/2019   Lab Results  Component Value Date   CHOLHDL 3 01/13/2019   Lab Results  Component Value Date   HGBA1C 6.8 (A) 07/30/2019   HGBA1C 6.8 07/30/2019   HGBA1C 6.8 (A) 07/30/2019   HGBA1C 6.8 07/30/2019    ASSESSMENT AND PLAN:   Health maintenance exam: Reviewed age and gender appropriate health maintenance issues (prudent diet, regular exercise, health risks  of tobacco and excessive alcohol, use of seatbelts, fire alarms in home, use of sunscreen).  Also reviewed age and gender appropriate health screening as well as vaccine recommendations. Vaccines: all UTD, including shingrix as well as covid vaccines. Labs: CBC, CMET,FLP, A1c, TSH (DM 2, HTN, HLD, hypoth) Cervical ca screening: no further screening indicated. Breast ca screening: mammogram NEG 12/2019.  Plan rpt 1 yr. Colon ca screening:  No further screening per pt preference.   An After Visit Summary was printed and given to the patient.  FOLLOW UP:  No follow-ups on file.  Signed:  Crissie Sickles, MD           01/19/2020

## 2020-01-27 ENCOUNTER — Other Ambulatory Visit: Payer: Self-pay | Admitting: Family Medicine

## 2020-01-28 DIAGNOSIS — H40013 Open angle with borderline findings, low risk, bilateral: Secondary | ICD-10-CM | POA: Diagnosis not present

## 2020-01-28 DIAGNOSIS — H18503 Unspecified hereditary corneal dystrophies, bilateral: Secondary | ICD-10-CM | POA: Diagnosis not present

## 2020-01-28 DIAGNOSIS — H04123 Dry eye syndrome of bilateral lacrimal glands: Secondary | ICD-10-CM | POA: Diagnosis not present

## 2020-01-28 DIAGNOSIS — H353131 Nonexudative age-related macular degeneration, bilateral, early dry stage: Secondary | ICD-10-CM | POA: Diagnosis not present

## 2020-01-30 DIAGNOSIS — H353132 Nonexudative age-related macular degeneration, bilateral, intermediate dry stage: Secondary | ICD-10-CM | POA: Diagnosis not present

## 2020-02-29 DIAGNOSIS — H353132 Nonexudative age-related macular degeneration, bilateral, intermediate dry stage: Secondary | ICD-10-CM | POA: Diagnosis not present

## 2020-03-30 DIAGNOSIS — H353132 Nonexudative age-related macular degeneration, bilateral, intermediate dry stage: Secondary | ICD-10-CM | POA: Diagnosis not present

## 2020-04-15 ENCOUNTER — Other Ambulatory Visit: Payer: Self-pay | Admitting: Family Medicine

## 2020-04-29 DIAGNOSIS — H353132 Nonexudative age-related macular degeneration, bilateral, intermediate dry stage: Secondary | ICD-10-CM | POA: Diagnosis not present

## 2020-05-29 DIAGNOSIS — H353132 Nonexudative age-related macular degeneration, bilateral, intermediate dry stage: Secondary | ICD-10-CM | POA: Diagnosis not present

## 2020-06-11 ENCOUNTER — Other Ambulatory Visit: Payer: Self-pay | Admitting: Family Medicine

## 2020-06-13 NOTE — Telephone Encounter (Signed)
Rx refill sent for mail delivery

## 2020-06-28 DIAGNOSIS — H353132 Nonexudative age-related macular degeneration, bilateral, intermediate dry stage: Secondary | ICD-10-CM | POA: Diagnosis not present

## 2020-07-21 ENCOUNTER — Encounter: Payer: Self-pay | Admitting: Family Medicine

## 2020-07-21 ENCOUNTER — Ambulatory Visit (INDEPENDENT_AMBULATORY_CARE_PROVIDER_SITE_OTHER): Payer: Medicare HMO | Admitting: Family Medicine

## 2020-07-21 VITALS — BP 183/80 | HR 78 | Temp 97.4°F | Resp 16 | Ht 66.0 in | Wt 165.6 lb

## 2020-07-21 DIAGNOSIS — E7800 Pure hypercholesterolemia, unspecified: Secondary | ICD-10-CM

## 2020-07-21 DIAGNOSIS — I10 Essential (primary) hypertension: Secondary | ICD-10-CM

## 2020-07-21 DIAGNOSIS — Z23 Encounter for immunization: Secondary | ICD-10-CM | POA: Diagnosis not present

## 2020-07-21 DIAGNOSIS — Z0000 Encounter for general adult medical examination without abnormal findings: Secondary | ICD-10-CM | POA: Diagnosis not present

## 2020-07-21 DIAGNOSIS — E119 Type 2 diabetes mellitus without complications: Secondary | ICD-10-CM

## 2020-07-21 DIAGNOSIS — E039 Hypothyroidism, unspecified: Secondary | ICD-10-CM | POA: Diagnosis not present

## 2020-07-21 LAB — TSH: TSH: 0.24 u[IU]/mL — ABNORMAL LOW (ref 0.35–4.50)

## 2020-07-21 LAB — LIPID PANEL
Cholesterol: 158 mg/dL (ref 0–200)
HDL: 62.4 mg/dL (ref >39.00)
LDL Cholesterol: 78 mg/dL (ref 0–99)
NonHDL: 95.58
Total CHOL/HDL Ratio: 3
Triglycerides: 86 mg/dL (ref 0.0–149.0)
VLDL: 17.2 mg/dL (ref 0.0–40.0)

## 2020-07-21 LAB — BASIC METABOLIC PANEL
BUN: 21 mg/dL (ref 6–23)
CO2: 35 mEq/L — ABNORMAL HIGH (ref 19–32)
Calcium: 9.5 mg/dL (ref 8.4–10.5)
Chloride: 100 mEq/L (ref 96–112)
Creatinine, Ser: 0.75 mg/dL (ref 0.40–1.20)
GFR: 73.58 mL/min (ref >60.00)
Glucose, Bld: 129 mg/dL — ABNORMAL HIGH (ref 70–99)
Potassium: 4.5 mEq/L (ref 3.5–5.1)
Sodium: 141 mEq/L (ref 135–145)

## 2020-07-21 LAB — HEMOGLOBIN A1C: Hgb A1c MFr Bld: 7.5 % — ABNORMAL HIGH (ref 4.6–6.5)

## 2020-07-21 LAB — MICROALBUMIN / CREATININE URINE RATIO
Creatinine,U: 218 mg/dL
Microalb Creat Ratio: 0.6 mg/g (ref 0.0–30.0)
Microalb, Ur: 1.3 mg/dL (ref 0.0–1.9)

## 2020-07-21 NOTE — Progress Notes (Signed)
OFFICE VISIT  07/21/2020  CC:  Chief Complaint  Patient presents with  . Follow-up    [name], pt is fasting    HPI:    Patient is a 86 y.o. Caucasian female who presents for 6 mo f/u DM 2, HTN, HLD, hypothyroidism.  DM:  Last A1c rose from 6.8% to 7.4%-->she chose TLC approach rather than start diabetes med. Fasting glucose checks 3 times a week range 110-140.  She is trying to be more choosy about what she chooses to eat.  Avoids starches and simple sugars. Lots of vegetables and fruits. No formal exercise.   No burning, tingling, or numbness in feet.  HTN: checks bp 2-3 times a week, always 120s/70s.  HLD: tolerating statin fine.  Hypoth: Takes T4 on empty stomach w/out any other meds.  ROS: no fevers, no CP, no SOB, no wheezing, no cough, no dizziness, no HAs, no rashes, no melena/hematochezia.  No polyuria or polydipsia.  No myalgias or arthralgias.  No focal weakness, paresthesias, or tremors.  No acute vision or hearing abnormalities. No n/v/d or abd pain.  No palpitations.   Feeling tired with activity since getting covid vaccine.  She felt no sign of this prior to getting covid vaccine.  Past Medical History:  Diagnosis Date  . Corneal dystrophies, hereditary   . Diabetes mellitus 10/2010   A1c 6.7%   . Diverticulosis   . Dry eye syndrome   . Hyperlipemia   . Hypertension   . Hypothyroidism   . Lumbar spondylosis 05/2016   x-ray  . Macular degeneration, bilateral   . Osteoarthritis   . Presbyopia   . Transfusion history 1959   post partum    Past Surgical History:  Procedure Laterality Date  . APPENDECTOMY  1959  . CATARACT EXTRACTION, BILATERAL     Dr Bing Plume  . CESAREAN SECTION  1957; 47  . COLECTOMY     18 inches removed 2002-2003 for Diverticulitis  . COLONOSCOPY     Tics,Dr Sam [place]--pt declines any further screening as of 12/2015.  Marland Kitchen DEXA  06/30/2016;10/13/19   2017 T score 2.5.  Rpt 2020 NORMAL. Rpt 2 yrs  . VAGINAL HYSTERECTOMY  1975   For  Fibroids (no BSO)    Outpatient Medications Prior to Visit  Medication Sig Dispense Refill  . atorvastatin (LIPITOR) 40 MG tablet TAKE 1 TABLET EVERY DAY 90 tablet 1  . cycloSPORINE (RESTASIS) 0.05 % ophthalmic emulsion Place 1 drop into both eyes 2 (two) times daily.      . hydrochlorothiazide (HYDRODIURIL) 12.5 MG tablet TAKE 1 TABLET EVERY DAY IN PLACE OF METHYCLOTHIAZIDE 90 tablet 1  . levothyroxine (SYNTHROID) 50 MCG tablet TAKE 1 TABLET EVERY DAY  EXCEPT TAKE 1/2 TABLET ON TUESDAY AND SATURDAY 78 tablet 1  . ramipril (ALTACE) 5 MG capsule TAKE 1 CAPSULE EVERY DAY 90 capsule 1   No facility-administered medications prior to visit.    Allergies  Allergen Reactions  . Doxycycline Hyclate Diarrhea  . Penicillins     Rash Because of a history of documented adverse serious drug reaction;Medi Alert bracelet  is recommended  . Tetanus Toxoid     REACTION: Arm swelling-severe Because of a history of documented adverse serious drug reaction;Medi Alert bracelet  is recommended  . Metformin And Related Nausea And Vomiting and Other (See Comments)    Abdominal pain    ROS As per HPI  PE: Vitals with BMI 07/21/2020 01/19/2020 10/19/2019  Height 5\' 6"  5\' 6"  -  Weight  165 lbs 10 oz 166 lbs 10 oz -  BMI 71.69 67.8 -  Systolic 938 101 751  Diastolic 80 77 86  Pulse 78 73 80  Manual bp recheck at end of o/v today was 122/82   Gen: Alert, well appearing.  Patient is oriented to person, place, time, and situation. AFFECT: pleasant, lucid thought and speech. CV: RRR, no m/r/g.   LUNGS: CTA bilat, nonlabored resps, good aeration in all lung fields. EXT: no clubbing or cyanosis.  no edema.  Foot exam -  no swelling, tenderness or skin or vascular lesions. Color and temperature is normal. Sensation is intact. Peripheral pulses are palpable. Toenails are normal.   LABS:  Lab Results  Component Value Date   TSH 0.80 01/19/2020   Lab Results  Component Value Date   WBC 5.6 01/19/2020    HGB 14.2 01/19/2020   HCT 42.6 01/19/2020   MCV 91.9 01/19/2020   PLT 214.0 01/19/2020   Lab Results  Component Value Date   CREATININE 0.72 01/19/2020   BUN 18 01/19/2020   NA 142 01/19/2020   K 4.0 01/19/2020   CL 103 01/19/2020   CO2 33 (H) 01/19/2020   Lab Results  Component Value Date   ALT 20 01/19/2020   AST 18 01/19/2020   ALKPHOS 72 01/19/2020   BILITOT 0.7 01/19/2020   Lab Results  Component Value Date   CHOL 159 01/19/2020   Lab Results  Component Value Date   HDL 49.60 01/19/2020   Lab Results  Component Value Date   LDLCALC 87 01/19/2020   Lab Results  Component Value Date   TRIG 112.0 01/19/2020   Lab Results  Component Value Date   CHOLHDL 3 01/19/2020   Lab Results  Component Value Date   HGBA1C 7.4 (H) 01/19/2020    IMPRESSION AND PLAN:  1) DM 2, control improved. HbA1c and lytes/cr and urine microalb/cr today. Feet exam normal today.  2) HTN: home measurements great. No changes.  Lytes/cr today.  3) Hypothyroidism: takes T4 with all her other meds but no vitamins, iron, or PPI/H2 blocker. She has consistently taken it this was over the years. TSH today.  4) HLD: tolerating statin. Diet better. Needs to start riding her exercise bike. FLP today.  5) Preventative health care:  Flu vaccine today.  Covid vaccine UTD.  An After Visit Summary was printed and given to the patient.  FOLLOW UP: Return in about 6 months (around 01/18/2021) for annual CPE (fasting).  Signed:  Crissie Sickles, MD           07/21/2020

## 2020-07-21 NOTE — Addendum Note (Signed)
Addended by: Octaviano Glow on: 07/21/2020 10:39 AM   Modules accepted: Orders

## 2020-07-25 ENCOUNTER — Other Ambulatory Visit: Payer: Self-pay

## 2020-07-25 ENCOUNTER — Telehealth: Payer: Self-pay | Admitting: Family Medicine

## 2020-07-25 DIAGNOSIS — E119 Type 2 diabetes mellitus without complications: Secondary | ICD-10-CM

## 2020-07-25 DIAGNOSIS — E039 Hypothyroidism, unspecified: Secondary | ICD-10-CM

## 2020-07-25 NOTE — Telephone Encounter (Signed)
Patient would like clarification for recommended dose of levothyroxine. Prior to bloodwork, she taking a whole pill Monday-Friday and nothing on Saturdays or Sundays. She understands now was taking medication incorrectly. Advised of new recommendations 3 days a week, 1/2 tab and whole pill all other days.   Please advise if she still needs to continue with that recommendation.

## 2020-07-25 NOTE — Telephone Encounter (Signed)
Patient is calling wanting to speak with Shannon Greene about a medication.

## 2020-07-25 NOTE — Telephone Encounter (Signed)
Patient is returning Briitanae's call about lab results,

## 2020-07-25 NOTE — Telephone Encounter (Signed)
Patient advised of lab results

## 2020-07-26 NOTE — Telephone Encounter (Signed)
OK. Take 1 full 50 mcg tab on Mondays and Fridays, then take 1/2 tab every other day of the week.-thx

## 2020-07-26 NOTE — Telephone Encounter (Signed)
Patient advised and voiced understanding. Written instructions given as well

## 2020-07-28 DIAGNOSIS — H353132 Nonexudative age-related macular degeneration, bilateral, intermediate dry stage: Secondary | ICD-10-CM | POA: Diagnosis not present

## 2020-08-01 DIAGNOSIS — H353132 Nonexudative age-related macular degeneration, bilateral, intermediate dry stage: Secondary | ICD-10-CM | POA: Diagnosis not present

## 2020-08-01 DIAGNOSIS — H04123 Dry eye syndrome of bilateral lacrimal glands: Secondary | ICD-10-CM | POA: Diagnosis not present

## 2020-08-01 DIAGNOSIS — H40013 Open angle with borderline findings, low risk, bilateral: Secondary | ICD-10-CM | POA: Diagnosis not present

## 2020-08-01 DIAGNOSIS — E119 Type 2 diabetes mellitus without complications: Secondary | ICD-10-CM | POA: Diagnosis not present

## 2020-08-01 DIAGNOSIS — H18593 Other hereditary corneal dystrophies, bilateral: Secondary | ICD-10-CM | POA: Diagnosis not present

## 2020-08-02 ENCOUNTER — Ambulatory Visit: Payer: Medicare HMO

## 2020-08-02 NOTE — Progress Notes (Signed)
Subjective:   Shannon Greene is a 86 y.o. female who presents for Medicare Annual (Subsequent) preventive examination.  I connected with Shannon Greene today by telephone and verified that I am speaking with the correct person using two identifiers. Location patient: home Location provider: work Persons participating in the virtual visit: patient, Marine scientist.    I discussed the limitations, risks, security and privacy concerns of performing an evaluation and management service by telephone and the availability of in person appointments. I also discussed with the patient that there may be a patient responsible charge related to this service. The patient expressed understanding and verbally consented to this telephonic visit.    Interactive audio and video telecommunications were attempted between this provider and patient, however failed, due to patient having technical difficulties OR patient did not have access to video capability.  We continued and completed visit with audio only.  Some vital signs may be absent or patient reported.   Time Spent with patient on telephone encounter: 20 minutes  Review of Systems     Cardiac Risk Factors include: advanced age (>73men, >92 women);diabetes mellitus;dyslipidemia;hypertension;sedentary lifestyle     Objective:    Today's Vitals   08/03/20 1332  Weight: 165 lb (74.8 kg)  Height: 5\' 6"  (1.676 m)   Body mass index is 26.63 kg/m.  Advanced Directives 08/03/2020 07/30/2019 07/04/2018 06/24/2017  Does Patient Have a Medical Advance Directive? Yes Yes Yes Yes  Type of Paramedic of [place];Living will [place];Living will [place];Living will Living will;Healthcare Power of [place] in Chart? No - copy requested No - copy requested No - copy requested No - copy requested    Current Medications (verified) Outpatient Encounter Medications as  of 08/03/2020  Medication Sig  . atorvastatin (LIPITOR) 40 MG tablet TAKE 1 TABLET EVERY DAY  . cycloSPORINE (RESTASIS) 0.05 % ophthalmic emulsion Place 1 drop into both eyes 2 (two) times daily.    . hydrochlorothiazide (HYDRODIURIL) 12.5 MG tablet TAKE 1 TABLET EVERY DAY IN PLACE OF METHYCLOTHIAZIDE  . levothyroxine (SYNTHROID) 50 MCG tablet TAKE 1 TABLET EVERY DAY  EXCEPT TAKE 1/2 TABLET ON TUESDAY AND SATURDAY  . ramipril (ALTACE) 5 MG capsule TAKE 1 CAPSULE EVERY DAY   No facility-administered encounter medications on file as of 08/03/2020.    Allergies (verified) Doxycycline hyclate, Penicillins, Tetanus toxoid, and Metformin and related   History: Past Medical History:  Diagnosis Date  . Corneal dystrophies, hereditary   . Diabetes mellitus 10/2010   A1c 6.7%   . Diverticulosis   . Dry eye syndrome   . Hyperlipemia   . Hypertension   . Hypothyroidism   . Lumbar spondylosis 05/2016   x-ray  . Macular degeneration, bilateral   . Osteoarthritis   . Presbyopia   . Transfusion history 1959   post partum   Past Surgical History:  Procedure Laterality Date  . APPENDECTOMY  1959  . CATARACT EXTRACTION, BILATERAL     Dr Bing Plume  . CESAREAN SECTION  1957; 17  . COLECTOMY     18 inches removed 2002-2003 for Diverticulitis  . COLONOSCOPY     Tics,Dr Sam [place]--pt declines any further screening as of 12/2015.  Marland Kitchen DEXA  06/30/2016;10/13/19   2017 T score 2.5.  Rpt 2020 NORMAL. Rpt 2 yrs  . VAGINAL HYSTERECTOMY  1975   For Fibroids (no BSO)   Family History  Problem Relation Age of Onset  .  Heart attack Mother 63  . Hypertension Mother   . Tuberculosis Father   . Diabetes Brother   . Stroke Brother 40  . Prostate cancer Brother   . Breast cancer Neg Hx    Social History   Socioeconomic History  . Marital status: Married    Spouse name: Not on file  . Number of children: Not on file  . Years of education: Not on file  . Highest education level: Not on file    Occupational History  . Not on file  Tobacco Use  . Smoking status: Never Smoker  . Smokeless tobacco: Never Used  Vaping Use  . Vaping Use: Never used  Substance and Sexual Activity  . Alcohol use: No  . Drug use: No  . Sexual activity: Not on file  Other Topics Concern  . Not on file  Social History Narrative   Married, 2 daughters.   Occupation: retired Facilities manager).   Silver sneakers.   No T/A/Ds.   Social Determinants of Health   Financial Resource Strain: Low Risk   . Difficulty of Paying Living Expenses: Not hard at all  Food Insecurity: No Food Insecurity  . Worried About Charity fundraiser in the Last Year: Never true  . Ran Out of Food in the Last Year: Never true  Transportation Needs: No Transportation Needs  . Lack of Transportation (Medical): No  . Lack of Transportation (Non-Medical): No  Physical Activity: Inactive  . Days of Exercise per Week: 0 days  . Minutes of Exercise per Session: 0 min  Stress: No Stress Concern Present  . Feeling of Stress : Not at all  Social Connections: Moderately Isolated  . Frequency of Communication with Friends and Family: More than three times a week  . Frequency of Social Gatherings with Friends and Family: Once a week  . Attends Religious Services: Never  . Active Member of Clubs or Organizations: No  . Attends Archivist Meetings: Never  . Marital Status: Married    Tobacco Counseling Counseling given: Not Answered   Clinical Intake:  Pre-visit preparation completed: Yes  Pain : No/denies pain     Nutritional Status: BMI 25 -29 Overweight Nutritional Risks: None Diabetes: Yes CBG done?: No Did pt. bring in CBG monitor from home?: No (phone visit)  How often do you need to have someone help you when you read instructions, pamphlets, or other written materials from your doctor or pharmacy?: 1 - Never What is the last grade level you completed in school?: 12th  grade  Diabetic?Yes  Diabetes:  Is the patient diabetic?  Yes  If diabetic, was a CBG obtained today?  No  Did the patient bring in their glucometer from home?  No phone visit How often do you monitor your CBG's? 2 x per week.   Financial Strains and Diabetes Management:  Are you having any financial strains with the device, your supplies or your medication? No .  Does the patient want to be seen by Chronic Care Management for management of their diabetes?  No  Would the patient like to be referred to a Nutritionist or for Diabetic Management?  No   Diabetic Exams:  Diabetic Eye Exam:  Overdue for diabetic eye exam. Pt has been advised about the importance in completing this exam. A referral has been placed today. Patient states she had an eye exam in March. No report in chart.  Diabetic Foot Exam: Completed 07/21/2020.    Interpreter Needed?: No  Information entered by :: Caroleen Hamman LPn   Activities of Daily Living In your present state of health, do you have any difficulty performing the following activities: 08/03/2020  Hearing? N  Vision? N  Difficulty concentrating or making decisions? N  Walking or climbing stairs? N  Dressing or bathing? N  Doing errands, shopping? N  Preparing Food and eating ? N  Using the Toilet? N  In the past six months, have you accidently leaked urine? N  Do you have problems with loss of bowel control? N  Managing your Medications? N  Managing your Finances? N  Housekeeping or managing your Housekeeping? N  Some recent data might be hidden    Patient Care Team: Tammi Sou, MD as PCP - General (Family Medicine) Calvert Cantor, MD as Consulting Physician (Ophthalmology)  Indicate any recent Medical Services you may have received from other than [facility] providers in the past year (date may be approximate).     Assessment:   This is a routine wellness examination for Shannon Greene.  Hearing/Vision screen  Hearing Screening   125Hz   250Hz  500Hz  1000Hz  2000Hz  3000Hz  4000Hz  6000Hz  8000Hz   Right ear:           Left ear:           Comments: No issues  Vision Screening Comments: Wears glasses Last eye exam-07/2020-Dr. Bing Plume  Dietary issues and exercise activities discussed: Current Exercise Habits: The patient does not participate in regular exercise at present, Exercise limited by: None identified  Goals      Patient Stated   .  <enter goal here> (pt-stated)      Maintain current health.       Depression Screen PHQ 2/9 Scores 08/03/2020 07/30/2019 01/13/2019 07/04/2018 06/24/2017 06/19/2016 12/16/2015  PHQ - 2 Score 0 0 0 0 0 0 0  Exception Documentation - - - - - - -    Fall Risk Fall Risk  08/03/2020 07/30/2019 01/13/2019 10/16/2018 07/04/2018  Falls in the past year? 0 0 0 0 No  Number falls in past yr: 0 0 - - -  Injury with Fall? 0 0 - - -  Follow up Falls prevention discussed Falls prevention discussed - - -    Any stairs in or around the home? No  Home free of loose throw rugs in walkways, pet beds, electrical cords, etc? Yes  Adequate lighting in your home to reduce risk of falls? Yes   ASSISTIVE DEVICES UTILIZED TO PREVENT FALLS:  Life alert? No  Use of a cane, walker or w/c? No  Grab bars in the bathroom? Yes  Shower chair or bench in shower? No  Elevated toilet seat or a handicapped toilet? No   TIMED UP AND GO:  Was the test performed? No . Phone visit   Cognitive Function:No cognitive impairment noted. MMSE - Mini Mental State Exam 07/30/2019 07/04/2018  Orientation to time 5 5  Orientation to Place 5 5  Registration 3 3  Attention/ Calculation 5 5  Recall 3 3  Language- name 2 objects 2 2  Language- repeat 1 1  Language- follow 3 step command 3 3  Language- read & follow direction 1 1  Write a sentence 1 1  Copy design 1 1  Total score 30 30        Immunizations Immunization History  Administered Date(s) Administered  . Fluad Quad(high Dose 65+) 07/14/2019, 07/21/2020  .  Influenza Whole 07/06/2012  . Influenza, High Dose Seasonal PF 07/19/2014, 07/28/2015, 08/05/2017,  07/15/2018  . Influenza,inj,Quad PF,6+ Mos 07/31/2016  . PFIZER SARS-COV-2 Vaccination 12/17/2019, 01/09/2020  . Pneumococcal Conjugate-13 12/16/2015  . Pneumococcal-Unspecified 11/05/2001  . Td 12/24/2016  . Zoster 06/21/2016  . Zoster Recombinat (Shingrix) 03/29/2017, 08/12/2017    TDAP status: Up to date   Flu Vaccine status: Up to date   Pneumococcal vaccine status: Up to date   Covid-19 vaccine status: Completed vaccines  Qualifies for Shingles Vaccine? No   Zostavax completed Yes   Shingrix Completed?: Yes  Screening Tests Health Maintenance  Topic Date Due  . OPHTHALMOLOGY EXAM  01/15/2020  . HEMOGLOBIN A1C  01/18/2021  . FOOT EXAM  07/21/2021  . DEXA SCAN  10/11/2022  . INFLUENZA VACCINE  Completed  . COVID-19 Vaccine  Completed  . PNA vac Low Risk Adult  Completed    Health Maintenance  Health Maintenance Due  Topic Date Due  . OPHTHALMOLOGY EXAM  01/15/2020    Colorectal cancer screening: No longer required.    Mammogram status: Completed 12/28/19. Repeat every year   Bone Density status: Completed 10/12/2019. Results reflect: Bone density results: NORMAL. Repeat every 2 years.  Lung Cancer Screening: (Low Dose CT Chest recommended if Age 42-80 years, 30 pack-year currently smoking OR have quit w/in 15years.) does not qualify.     Additional Screening:  Hepatitis C Screening: does not qualify  Vision Screening: Recommended annual ophthalmology exams for early detection of glaucoma and other disorders of the eye. Is the patient up to date with their annual eye exam?  Yes  Who is the provider or what is the name of the office in which the patient attends annual eye exams? Dr. Bing Plume   Dental Screening: Recommended annual dental exams for proper oral hygiene  Community Resource Referral / Chronic Care Management: CRR required this visit?  No   CCM  required this visit?  No      Plan:     I have personally reviewed and noted the following in the patient's chart:   . Medical and social history . Use of alcohol, tobacco or illicit drugs  . Current medications and supplements . Functional ability and status . Nutritional status . Physical activity . Advanced directives . List of other physicians . Hospitalizations, surgeries, and ER visits in previous 12 months . Vitals . Screenings to include cognitive, depression, and falls . Referrals and appointments  In addition, I have reviewed and discussed with patient certain preventive protocols, quality metrics, and best practice recommendations. A written personalized care plan for preventive services as well as general preventive health recommendations were provided to patient.    Due to this being a telephonic visit, the after visit summary with patients personalized plan was offered to patient via mail or my-chart.  Patient would like to access on my-chart.  Marta Antu, LPN   [birth year]  Nurse Health Advisor  Nurse Notes: None

## 2020-08-03 ENCOUNTER — Ambulatory Visit (INDEPENDENT_AMBULATORY_CARE_PROVIDER_SITE_OTHER): Payer: Medicare HMO

## 2020-08-03 VITALS — Ht 66.0 in | Wt 165.0 lb

## 2020-08-03 DIAGNOSIS — Z0000 Encounter for general adult medical examination without abnormal findings: Secondary | ICD-10-CM

## 2020-08-03 NOTE — Patient Instructions (Signed)
Shannon Greene , Thank you for taking time to complete Medicare Wellness Visit. I appreciate your ongoing commitment to your health goals. Please review the following plan we discussed and let me know if I can assist you in the future.   Screening recommendations/referrals: Colonoscopy: No longer indicated Mammogram: Completed 12/28/2019- Due 12/27/2020 Bone Density: Completed 10/12/2019-Due-10/11/2021 Recommended yearly ophthalmology/optometry visit for glaucoma screening and checkup Recommended yearly dental visit for hygiene and checkup  Vaccinations: Influenza vaccine: Up to Date Pneumococcal vaccine: Completed vaccines Tdap vaccine: Up to Date- Due-12/24/2026 Shingles vaccine: Completed vaccines Covid-19:Completed vaccines  Advanced directives: Please bring a copy for your chart  Conditions/risks identified: See problem list  Next appointment: Follow up in one year for your annual wellness visit  08/09/2021 @ 1:30pm   Preventive Care 65 Years and Older, Female Preventive care refers to lifestyle choices and visits with your health care provider that can promote health and wellness. What does preventive care include?  A yearly physical exam. This is also called an annual well check.  Dental exams once or twice a year.  Routine eye exams. Ask your health care provider how often you should have your eyes checked.  Personal lifestyle choices, including:  Daily care of your teeth and gums.  Regular physical activity.  Eating a healthy diet.  Avoiding tobacco and drug use.  Limiting alcohol use.  Practicing safe sex.  Taking low-dose aspirin every day.  Taking vitamin and mineral supplements as recommended by your health care provider. What happens during an annual well check? The services and screenings done by your health care provider during your annual well check will depend on your age, overall health, lifestyle risk factors, and family history of disease. Counseling    Your health care provider may ask you questions about your:  Alcohol use.  Tobacco use.  Drug use.  Emotional well-being.  Home and relationship well-being.  Sexual activity.  Eating habits.  History of falls.  Memory and ability to understand (cognition).  Work and work Statistician.  Reproductive health. Screening  You may have the following tests or measurements:  Height, weight, and BMI.  Blood pressure.  Lipid and cholesterol levels. These may be checked every 5 years, or more frequently if you are over 23 years old.  Skin check.  Lung cancer screening. You may have this screening every year starting at age 22 if you have a 30-pack-year history of smoking and currently smoke or have quit within the past 15 years.  Fecal occult blood test (FOBT) of the stool. You may have this test every year starting at age 39.  Flexible sigmoidoscopy or colonoscopy. You may have a sigmoidoscopy every 5 years or a colonoscopy every 10 years starting at age 50.  Hepatitis C blood test.  Hepatitis B blood test.  Sexually transmitted disease (STD) testing.  Diabetes screening. This is done by checking your blood sugar (glucose) after you have not eaten for a while (fasting). You may have this done every 1-3 years.  Bone density scan. This is done to screen for osteoporosis. You may have this done starting at age 4.  Mammogram. This may be done every 1-2 years. Talk to your health care provider about how often you should have regular mammograms. Talk with your health care provider about your test results, treatment options, and if necessary, the need for more tests. Vaccines  Your health care provider may recommend certain vaccines, such as:  Influenza vaccine. This is recommended every year.  Tetanus,  diphtheria, and acellular pertussis (Tdap, Td) vaccine. You may need a Td booster every 10 years.  Zoster vaccine. You may need this after age 75.  Pneumococcal 13-valent  conjugate (PCV13) vaccine. One dose is recommended after age 67.  Pneumococcal polysaccharide (PPSV23) vaccine. One dose is recommended after age 58. Talk to your health care provider about which screenings and vaccines you need and how often you need them. This information is not intended to replace advice given to you by your health care provider. Make sure you discuss any questions you have with your health care provider. Document Released: 11/18/2015 Document Revised: 07/11/2016 Document Reviewed: 08/23/2015 Elsevier Interactive Patient Education  2017 [place] Prevention in the Home Falls can cause injuries. They can happen to people of all ages. There are many things you can do to make your home safe and to help prevent falls. What can I do on the outside of my home?  Regularly fix the edges of walkways and driveways and fix any cracks.  Remove anything that might make you trip as you walk through a door, such as a raised step or threshold.  Trim any bushes or trees on the path to your home.  Use bright outdoor lighting.  Clear any walking paths of anything that might make someone trip, such as rocks or tools.  Regularly check to see if handrails are loose or broken. Make sure that both sides of any steps have handrails.  Any raised decks and porches should have guardrails on the edges.  Have any leaves, snow, or ice cleared regularly.  Use sand or salt on walking paths during winter.  Clean up any spills in your garage right away. This includes oil or grease spills. What can I do in the bathroom?  Use night lights.  Install grab bars by the toilet and in the tub and shower. Do not use towel bars as grab bars.  Use non-skid mats or decals in the tub or shower.  If you need to sit down in the shower, use a plastic, non-slip stool.  Keep the floor dry. Clean up any water that spills on the floor as soon as it happens.  Remove soap buildup in the tub or  shower regularly.  Attach bath mats securely with double-sided non-slip rug tape.  Do not have throw rugs and other things on the floor that can make you trip. What can I do in the bedroom?  Use night lights.  Make sure that you have a light by your bed that is easy to reach.  Do not use any sheets or blankets that are too big for your bed. They should not hang down onto the floor.  Have a firm chair that has side arms. You can use this for support while you get dressed.  Do not have throw rugs and other things on the floor that can make you trip. What can I do in the kitchen?  Clean up any spills right away.  Avoid walking on wet floors.  Keep items that you use a lot in easy-to-reach places.  If you need to reach something above you, use a strong step stool that has a grab bar.  Keep electrical cords out of the way.  Do not use floor polish or wax that makes floors slippery. If you must use wax, use non-skid floor wax.  Do not have throw rugs and other things on the floor that can make you trip. What can I do with  my stairs?  Do not leave any items on the stairs.  Make sure that there are handrails on both sides of the stairs and use them. Fix handrails that are broken or loose. Make sure that handrails are as long as the stairways.  Check any carpeting to make sure that it is firmly attached to the stairs. Fix any carpet that is loose or worn.  Avoid having throw rugs at the top or bottom of the stairs. If you do have throw rugs, attach them to the floor with carpet tape.  Make sure that you have a light switch at the top of the stairs and the bottom of the stairs. If you do not have them, ask someone to add them for you. What else can I do to help prevent falls?  Wear shoes that:  Do not have high heels.  Have rubber bottoms.  Are comfortable and fit you well.  Are closed at the toe. Do not wear sandals.  If you use a stepladder:  Make sure that it is fully  opened. Do not climb a closed stepladder.  Make sure that both sides of the stepladder are locked into place.  Ask someone to hold it for you, if possible.  Clearly mark and make sure that you can see:  Any grab bars or handrails.  First and last steps.  Where the edge of each step is.  Use tools that help you move around (mobility aids) if they are needed. These include:  Canes.  Walkers.  Scooters.  Crutches.  Turn on the lights when you go into a dark area. Replace any light bulbs as soon as they burn out.  Set up your furniture so you have a clear path. Avoid moving your furniture around.  If any of your floors are uneven, fix them.  If there are any pets around you, be aware of where they are.  Review your medicines with your doctor. Some medicines can make you feel dizzy. This can increase your chance of falling. Ask your doctor what other things that you can do to help prevent falls. This information is not intended to replace advice given to you by your health care provider. Make sure you discuss any questions you have with your health care provider. Document Released: 08/18/2009 Document Revised: 03/29/2016 Document Reviewed: 11/26/2014 Elsevier Interactive Patient Education  2017 [employer].

## 2020-08-23 ENCOUNTER — Other Ambulatory Visit: Payer: Self-pay | Admitting: Family Medicine

## 2020-08-26 ENCOUNTER — Telehealth: Payer: Self-pay

## 2020-08-26 NOTE — Telephone Encounter (Signed)
Patient calling about a bill for her medicare well visit on 09/01/2023.  It was coded as an office visit.  Patient is requesting to resubmit claim to insurance.

## 2020-08-27 DIAGNOSIS — H353132 Nonexudative age-related macular degeneration, bilateral, intermediate dry stage: Secondary | ICD-10-CM | POA: Diagnosis not present

## 2020-09-01 NOTE — Telephone Encounter (Signed)
I have forwarded this information on to [employer].  They will be in touch with the patient.

## 2020-09-26 DIAGNOSIS — H353132 Nonexudative age-related macular degeneration, bilateral, intermediate dry stage: Secondary | ICD-10-CM | POA: Diagnosis not present

## 2020-10-03 NOTE — Telephone Encounter (Signed)
Patient calling back regarding bill for medicare well visit on 08/03/20.Marland Kitchen Patient states she has received another bill for $295.00 and does not owe this amount.  She has not received a call froom anyone regarding her bill. (see telephone note dated 09/01/20)  Nicie can be reached at [phone] (home).

## 2020-10-11 NOTE — Telephone Encounter (Signed)
I sent an email to the Charge Correction team on behalf of patient asking them to review for recoding purposes.

## 2020-10-18 ENCOUNTER — Other Ambulatory Visit: Payer: Self-pay | Admitting: Family Medicine

## 2020-10-19 NOTE — Telephone Encounter (Signed)
Pt scheduled for repeat TSH on 10/24/20.

## 2020-10-21 ENCOUNTER — Other Ambulatory Visit: Payer: Self-pay | Admitting: Family Medicine

## 2020-10-24 ENCOUNTER — Other Ambulatory Visit: Payer: Self-pay

## 2020-10-24 ENCOUNTER — Other Ambulatory Visit: Payer: Self-pay | Admitting: Family Medicine

## 2020-10-24 ENCOUNTER — Ambulatory Visit (INDEPENDENT_AMBULATORY_CARE_PROVIDER_SITE_OTHER): Payer: Medicare HMO

## 2020-10-24 DIAGNOSIS — E039 Hypothyroidism, unspecified: Secondary | ICD-10-CM

## 2020-10-24 DIAGNOSIS — E119 Type 2 diabetes mellitus without complications: Secondary | ICD-10-CM

## 2020-10-24 LAB — BASIC METABOLIC PANEL WITH GFR
BUN: 18 mg/dL (ref 6–23)
CO2: 35 meq/L — ABNORMAL HIGH (ref 19–32)
Calcium: 9.6 mg/dL (ref 8.4–10.5)
Chloride: 100 meq/L (ref 96–112)
Creatinine, Ser: 0.77 mg/dL (ref 0.40–1.20)
GFR: 70.81 mL/min (ref >60.00)
Glucose, Bld: 138 mg/dL — ABNORMAL HIGH (ref 70–99)
Potassium: 3.9 meq/L (ref 3.5–5.1)
Sodium: 141 meq/L (ref 135–145)

## 2020-10-24 LAB — HEMOGLOBIN A1C: Hgb A1c MFr Bld: 7.5 % — ABNORMAL HIGH (ref 4.6–6.5)

## 2020-10-24 LAB — TSH: TSH: 0.86 u[IU]/mL (ref 0.35–4.50)

## 2020-10-25 ENCOUNTER — Ambulatory Visit: Payer: Medicare HMO

## 2020-10-26 DIAGNOSIS — H353132 Nonexudative age-related macular degeneration, bilateral, intermediate dry stage: Secondary | ICD-10-CM | POA: Diagnosis not present

## 2020-10-31 ENCOUNTER — Other Ambulatory Visit: Payer: Self-pay | Admitting: Family Medicine

## 2020-11-14 ENCOUNTER — Other Ambulatory Visit: Payer: Self-pay | Admitting: Family Medicine

## 2020-11-14 DIAGNOSIS — Z1231 Encounter for screening mammogram for malignant neoplasm of breast: Secondary | ICD-10-CM

## 2020-11-25 DIAGNOSIS — H353132 Nonexudative age-related macular degeneration, bilateral, intermediate dry stage: Secondary | ICD-10-CM | POA: Diagnosis not present

## 2020-12-25 DIAGNOSIS — H353132 Nonexudative age-related macular degeneration, bilateral, intermediate dry stage: Secondary | ICD-10-CM | POA: Diagnosis not present

## 2020-12-29 ENCOUNTER — Ambulatory Visit
Admission: RE | Admit: 2020-12-29 | Discharge: 2020-12-29 | Disposition: A | Discharge status: Home / Self Care | Payer: Medicare HMO | Source: Ambulatory Visit | Attending: Family Medicine | Admitting: Family Medicine

## 2020-12-29 ENCOUNTER — Other Ambulatory Visit: Payer: Self-pay

## 2020-12-29 DIAGNOSIS — Z1231 Encounter for screening mammogram for malignant neoplasm of breast: Secondary | ICD-10-CM | POA: Diagnosis not present

## 2021-01-18 ENCOUNTER — Other Ambulatory Visit: Payer: Self-pay

## 2021-01-19 ENCOUNTER — Encounter: Payer: Self-pay | Admitting: Family Medicine

## 2021-01-19 ENCOUNTER — Ambulatory Visit (INDEPENDENT_AMBULATORY_CARE_PROVIDER_SITE_OTHER): Payer: Medicare HMO | Admitting: Family Medicine

## 2021-01-19 VITALS — BP 132/80 | HR 74 | Temp 97.7°F | Resp 16 | Ht 65.0 in | Wt 168.4 lb

## 2021-01-19 DIAGNOSIS — E039 Hypothyroidism, unspecified: Secondary | ICD-10-CM | POA: Diagnosis not present

## 2021-01-19 DIAGNOSIS — E119 Type 2 diabetes mellitus without complications: Secondary | ICD-10-CM | POA: Diagnosis not present

## 2021-01-19 DIAGNOSIS — Z0000 Encounter for general adult medical examination without abnormal findings: Secondary | ICD-10-CM | POA: Diagnosis not present

## 2021-01-19 DIAGNOSIS — I10 Essential (primary) hypertension: Secondary | ICD-10-CM | POA: Diagnosis not present

## 2021-01-19 DIAGNOSIS — E7800 Pure hypercholesterolemia, unspecified: Secondary | ICD-10-CM | POA: Diagnosis not present

## 2021-01-19 LAB — LIPID PANEL
Cholesterol: 160 mg/dL (ref 0–200)
HDL: 59.1 mg/dL (ref >39.00)
LDL Cholesterol: 80 mg/dL (ref 0–99)
NonHDL: 100.76
Total CHOL/HDL Ratio: 3
Triglycerides: 102 mg/dL (ref 0.0–149.0)
VLDL: 20.4 mg/dL (ref 0.0–40.0)

## 2021-01-19 LAB — CBC WITH DIFFERENTIAL/PLATELET
Basophils Absolute: 0.1 10*3/uL (ref 0.0–0.1)
Basophils Relative: 1.2 % (ref 0.0–3.0)
Eosinophils Absolute: 0.1 10*3/uL (ref 0.0–0.7)
Eosinophils Relative: 1.9 % (ref 0.0–5.0)
HCT: 42.3 % (ref 36.0–46.0)
Hemoglobin: 14.3 g/dL (ref 12.0–15.0)
Lymphocytes Relative: 33.5 % (ref 12.0–46.0)
Lymphs Abs: 1.8 10*3/uL (ref 0.7–4.0)
MCHC: 33.9 g/dL (ref 30.0–36.0)
MCV: 90.8 fl (ref 78.0–100.0)
Monocytes Absolute: 0.5 10*3/uL (ref 0.1–1.0)
Monocytes Relative: 8.9 % (ref 3.0–12.0)
Neutro Abs: 3 10*3/uL (ref 1.4–7.7)
Neutrophils Relative %: 54.5 % (ref 43.0–77.0)
Platelets: 199 10*3/uL (ref 150.0–400.0)
RBC: 4.66 Mil/uL (ref 3.87–5.11)
RDW: 13.1 % (ref 11.5–15.5)
WBC: 5.5 10*3/uL (ref 4.0–10.5)

## 2021-01-19 LAB — COMPREHENSIVE METABOLIC PANEL
ALT: 26 U/L (ref 0–35)
AST: 21 U/L (ref 0–37)
Albumin: 3.7 g/dL (ref 3.5–5.2)
Alkaline Phosphatase: 70 U/L (ref 39–117)
BUN: 17 mg/dL (ref 6–23)
CO2: 34 mEq/L — ABNORMAL HIGH (ref 19–32)
Calcium: 9.2 mg/dL (ref 8.4–10.5)
Chloride: 101 mEq/L (ref 96–112)
Creatinine, Ser: 0.72 mg/dL (ref 0.40–1.20)
GFR: 76.63 mL/min (ref >60.00)
Glucose, Bld: 141 mg/dL — ABNORMAL HIGH (ref 70–99)
Potassium: 4.2 mEq/L (ref 3.5–5.1)
Sodium: 141 mEq/L (ref 135–145)
Total Bilirubin: 0.8 mg/dL (ref 0.2–1.2)
Total Protein: 6.2 g/dL (ref 6.0–8.3)

## 2021-01-19 LAB — HEMOGLOBIN A1C: Hgb A1c MFr Bld: 7.6 % — ABNORMAL HIGH (ref 4.6–6.5)

## 2021-01-19 NOTE — Progress Notes (Signed)
Office Note 01/19/2021  CC:  Chief Complaint  Patient presents with  . Annual Exam    Pt is fasting    HPI:  Shannon Greene is a 87 y.o. White female who is here for annual health maintenance exam and 6 mo f/u DM 2, HTN, HLD, hypothyroidism. A/P as of last visit: ") DM 2, control improved. HbA1c and lytes/cr and urine microalb/cr today. Feet exam normal today.  2) HTN: home measurements great. No changes.  Lytes/cr today.  3) Hypothyroidism: takes T4 with all her other meds but no vitamins, iron, or PPI/H2 blocker. She has consistently taken it this was over the years. TSH today.  4) HLD: tolerating statin. Diet better. Needs to start riding her exercise bike. FLP today.  5) Preventative health care:  Flu vaccine today.  Covid vaccine UTD."  INTERIM HX: Feeling well. Just deals with lots of stress trying to take care of chronically ill husband.  Home bp's consistently 120s/60s.  Compliant with all meds.  Taking statin daily.    Past Medical History:  Diagnosis Date  . Corneal dystrophies, hereditary   . Diabetes mellitus 10/2010   A1c 6.7%   . Diverticulosis   . Dry eye syndrome   . Hyperlipemia   . Hypertension   . Hypothyroidism   . Lumbar spondylosis 05/2016   x-ray  . Macular degeneration, bilateral   . Osteoarthritis   . Presbyopia   . Transfusion history 1959   post partum    Past Surgical History:  Procedure Laterality Date  . APPENDECTOMY  1959  . CATARACT EXTRACTION, BILATERAL     Dr Bing Plume  . CESAREAN SECTION  1957; 78  . COLECTOMY     18 inches removed 2002-2003 for Diverticulitis  . COLONOSCOPY     Tics,Dr Sam [place]--pt declines any further screening as of 12/2015.  Marland Kitchen DEXA  06/30/2016;10/13/19   2017 T score 2.5.  Rpt 2020 NORMAL. Rpt 2 yrs  . VAGINAL HYSTERECTOMY  1975   For Fibroids (no BSO)    Family History  Problem Relation Age of Onset  . Heart attack Mother 26  . Hypertension Mother   . Tuberculosis  Father   . Diabetes Brother   . Stroke Brother 40  . Prostate cancer Brother   . Breast cancer Neg Hx     Social History   Socioeconomic History  . Marital status: Married    Spouse name: Not on file  . Number of children: Not on file  . Years of education: Not on file  . Highest education level: Not on file  Occupational History  . Not on file  Tobacco Use  . Smoking status: Never Smoker  . Smokeless tobacco: Never Used  Vaping Use  . Vaping Use: Never used  Substance and Sexual Activity  . Alcohol use: No  . Drug use: No  . Sexual activity: Not on file  Other Topics Concern  . Not on file  Social History Narrative   Married, 2 daughters.   Occupation: retired Facilities manager).   Silver sneakers.   No T/A/Ds.   Social Determinants of Health   Financial Resource Strain: Low Risk   . Difficulty of Paying Living Expenses: Not hard at all  Food Insecurity: No Food Insecurity  . Worried About Charity fundraiser in the Last Year: Never true  . Ran Out of Food in the Last Year: Never true  Transportation Needs: No Transportation Needs  . Lack of Transportation (Medical): No  .  Lack of Transportation (Non-Medical): No  Physical Activity: Inactive  . Days of Exercise per Week: 0 days  . Minutes of Exercise per Session: 0 min  Stress: No Stress Concern Present  . Feeling of Stress : Not at all  Social Connections: Moderately Isolated  . Frequency of Communication with Friends and Family: More than three times a week  . Frequency of Social Gatherings with Friends and Family: Once a week  . Attends Religious Services: Never  . Active Member of Clubs or Organizations: No  . Attends Archivist Meetings: Never  . Marital Status: Married  Human resources officer Violence: Not At Risk  . Fear of Current or Ex-Partner: No  . Emotionally Abused: No  . Physically Abused: No  . Sexually Abused: No    Outpatient Medications Prior to Visit  Medication Sig  Dispense Refill  . atorvastatin (LIPITOR) 40 MG tablet TAKE 1 TABLET EVERY DAY 90 tablet 1  . cycloSPORINE (RESTASIS) 0.05 % ophthalmic emulsion Place 1 drop into both eyes 2 (two) times daily.    . hydrochlorothiazide (HYDRODIURIL) 12.5 MG tablet TAKE 1 TABLET EVERY DAY IN PLACE OF METHYCLOTHIAZIDE 90 tablet 1  . levothyroxine (SYNTHROID) 50 MCG tablet TAKE 1 TABLET EVERY DAY  EXCEPT TAKE 1/2 TABLET ON TUESDAY, THURSDAY, AND SATURDAY 84 tablet 1  . ramipril (ALTACE) 5 MG capsule TAKE 1 CAPSULE EVERY DAY 90 capsule 1   No facility-administered medications prior to visit.    Allergies  Allergen Reactions  . Doxycycline Hyclate Diarrhea  . Penicillins     Rash Because of a history of documented adverse serious drug reaction;Medi Alert bracelet  is recommended  . Tetanus Toxoid     REACTION: Arm swelling-severe Because of a history of documented adverse serious drug reaction;Medi Alert bracelet  is recommended  . Metformin And Related Nausea And Vomiting and Other (See Comments)    Abdominal pain   ROS Review of Systems  Constitutional: Negative for appetite change, chills, fatigue and fever.  HENT: Negative for congestion, dental problem, ear pain and sore throat.   Eyes: Negative for discharge, redness and visual disturbance.  Respiratory: Negative for cough, chest tightness, shortness of breath and wheezing.   Cardiovascular: Negative for chest pain, palpitations and leg swelling.  Gastrointestinal: Negative for abdominal pain, blood in stool, diarrhea, nausea and vomiting.  Genitourinary: Negative for difficulty urinating, dysuria, flank pain, frequency, hematuria and urgency.  Musculoskeletal: Negative for arthralgias, back pain, joint swelling, myalgias and neck stiffness.  Skin: Negative for pallor and rash.  Neurological: Negative for dizziness, speech difficulty, weakness and headaches.  Hematological: Negative for adenopathy. Does not bruise/bleed easily.   Psychiatric/Behavioral: Negative for confusion and sleep disturbance. The patient is not nervous/anxious.     PE; Vitals with BMI 01/19/2021 08/03/2020 07/21/2020  Height 5\' 5"  5\' 6"  5\' 6"   Weight 168 lbs 6 oz 165 lbs 165 lbs 10 oz  BMI 28.02 64.33 29.51  Systolic 884 - 166  Diastolic 80 - 80  Pulse 74 - 78   Exam chaperoned by Deveron Furlong, CMA.  Gen: Alert, well appearing.  Patient is oriented to person, place, time, and situation. AFFECT: pleasant, lucid thought and speech. ENT: Ears: EACs clear, normal epithelium.  TMs with good light reflex and landmarks bilaterally.  Eyes: no injection, icteris, swelling, or exudate.  EOMI, PERRLA. Nose: no drainage or turbinate edema/swelling.  No injection or focal lesion.  Mouth: lips without lesion/swelling.  Oral mucosa pink and moist.  Dentition intact and  without obvious caries or gingival swelling.  Oropharynx without erythema, exudate, or swelling.  Neck: supple/nontender.  No LAD, mass, or TM.  Carotid pulses 2+ bilaterally, without bruits. CV: RRR, no m/r/g.   LUNGS: CTA bilat, nonlabored resps, good aeration in all lung fields. ABD: soft, NT, ND, BS normal.  No hepatospenomegaly or mass.  No bruits. EXT: no clubbing, cyanosis, or edema.  Musculoskeletal: no joint swelling, erythema, warmth, or tenderness.  ROM of all joints intact. Skin - no sores or suspicious lesions or rashes or color changes   Pertinent labs:  Lab Results  Component Value Date   TSH 0.86 10/24/2020   Lab Results  Component Value Date   WBC 5.6 01/19/2020   HGB 14.2 01/19/2020   HCT 42.6 01/19/2020   MCV 91.9 01/19/2020   PLT 214.0 01/19/2020   Lab Results  Component Value Date   CREATININE 0.77 10/24/2020   BUN 18 10/24/2020   NA 141 10/24/2020   K 3.9 10/24/2020   CL 100 10/24/2020   CO2 35 (H) 10/24/2020   Lab Results  Component Value Date   ALT 20 01/19/2020   AST 18 01/19/2020   ALKPHOS 72 01/19/2020   BILITOT 0.7 01/19/2020   Lab  Results  Component Value Date   CHOL 158 07/21/2020   Lab Results  Component Value Date   HDL 62.40 07/21/2020   Lab Results  Component Value Date   LDLCALC 78 07/21/2020   Lab Results  Component Value Date   TRIG 86.0 07/21/2020   Lab Results  Component Value Date   CHOLHDL 3 07/21/2020   Lab Results  Component Value Date   HGBA1C 7.5 (H) 10/24/2020   ASSESSMENT AND PLAN:   1) HTN: well controlled on hctz 12.5mg  qd and ramipril 5mg  qd. Lytes/cr today.  2) Hyperchol: tolerating atorva 40mg  qd. FLP and hepatic panel today.  3) DM 2: diet-controlled. Hba1c and lytes/cr today.  4) Hypothyroidism: taking T4 correctly. Last TSH 3 mo ago nl/stable.  Plan repeat 6-9 mo.  5) Health maintenance exam: Reviewed age and gender appropriate health maintenance issues (prudent diet, regular exercise, health risks of tobacco and excessive alcohol, use of seatbelts, fire alarms in home, use of sunscreen).  Also reviewed age and gender appropriate health screening as well as vaccine recommendations. Vaccines: ALL UTD. Labs: cbc,cmet,flp,a1c Cervical ca screening: no further screening indicated due to age. Breast ca screening: next mammogram due 12/2021. Colon ca screening: no further screening is indicated due to age. Screening for osteoporosis: normal bone density on DEXA's 2017 and 2020.  Plan rpt 1 yr.  An After Visit Summary was printed and given to the patient.  FOLLOW UP:  Return in about 3 months (around 04/21/2021).  Signed:  Crissie Sickles, MD           01/19/2021

## 2021-01-24 DIAGNOSIS — H353132 Nonexudative age-related macular degeneration, bilateral, intermediate dry stage: Secondary | ICD-10-CM | POA: Diagnosis not present

## 2021-02-08 DIAGNOSIS — E119 Type 2 diabetes mellitus without complications: Secondary | ICD-10-CM | POA: Diagnosis not present

## 2021-02-08 DIAGNOSIS — H04123 Dry eye syndrome of bilateral lacrimal glands: Secondary | ICD-10-CM | POA: Diagnosis not present

## 2021-02-08 DIAGNOSIS — H40013 Open angle with borderline findings, low risk, bilateral: Secondary | ICD-10-CM | POA: Diagnosis not present

## 2021-02-08 DIAGNOSIS — H353132 Nonexudative age-related macular degeneration, bilateral, intermediate dry stage: Secondary | ICD-10-CM | POA: Diagnosis not present

## 2021-02-15 ENCOUNTER — Other Ambulatory Visit: Payer: Self-pay | Admitting: Family Medicine

## 2021-02-23 DIAGNOSIS — H353132 Nonexudative age-related macular degeneration, bilateral, intermediate dry stage: Secondary | ICD-10-CM | POA: Diagnosis not present

## 2021-03-08 ENCOUNTER — Other Ambulatory Visit: Payer: Self-pay | Admitting: Family Medicine

## 2021-03-20 ENCOUNTER — Other Ambulatory Visit: Payer: Self-pay | Admitting: Family Medicine

## 2021-03-25 DIAGNOSIS — H353132 Nonexudative age-related macular degeneration, bilateral, intermediate dry stage: Secondary | ICD-10-CM | POA: Diagnosis not present

## 2021-04-19 ENCOUNTER — Other Ambulatory Visit: Payer: Self-pay | Admitting: Family Medicine

## 2021-04-21 ENCOUNTER — Encounter: Payer: Self-pay | Admitting: Family Medicine

## 2021-04-21 ENCOUNTER — Ambulatory Visit (INDEPENDENT_AMBULATORY_CARE_PROVIDER_SITE_OTHER): Payer: Medicare HMO | Admitting: Family Medicine

## 2021-04-21 ENCOUNTER — Other Ambulatory Visit: Payer: Self-pay

## 2021-04-21 VITALS — BP 131/80 | HR 71 | Temp 97.8°F | Resp 16 | Ht 65.0 in | Wt 161.2 lb

## 2021-04-21 DIAGNOSIS — E039 Hypothyroidism, unspecified: Secondary | ICD-10-CM | POA: Diagnosis not present

## 2021-04-21 DIAGNOSIS — I10 Essential (primary) hypertension: Secondary | ICD-10-CM | POA: Diagnosis not present

## 2021-04-21 DIAGNOSIS — E7800 Pure hypercholesterolemia, unspecified: Secondary | ICD-10-CM | POA: Diagnosis not present

## 2021-04-21 DIAGNOSIS — E119 Type 2 diabetes mellitus without complications: Secondary | ICD-10-CM

## 2021-04-21 LAB — POCT GLYCOSYLATED HEMOGLOBIN (HGB A1C)
HbA1c POC (<> result, manual entry): 6.5 % (ref 4.0–5.6)
HbA1c, POC (controlled diabetic range): 6.5 % (ref 0.0–7.0)
HbA1c, POC (prediabetic range): 6.5 % — AB (ref 5.7–6.4)
Hemoglobin A1C: 6.5 % — AB (ref 4.0–5.6)

## 2021-04-21 NOTE — Progress Notes (Signed)
OFFICE VISIT  04/21/2021  CC:  Chief Complaint  Patient presents with   Follow-up    [name], 3 mo. Pt is fasting. Eye exam scheduled for August.    HPI:    Patient is a 87 y.o. Caucasian female who presents for 3 mo f/u DM 2, HTN, HLD, hypothyroidism. A/P as of last visit: "1) HTN: well controlled on hctz 12.5mg  qd and ramipril 5mg  qd. Lytes/cr today.   2) Hyperchol: tolerating atorva 40mg  qd. FLP and hepatic panel today.   3) DM 2: diet-controlled. Hba1c and lytes/cr today.   4) Hypothyroidism: taking T4 correctly. Last TSH 3 mo ago nl/stable.  Plan repeat 6-9 mo.   5) Health maintenance exam: Reviewed age and gender appropriate health maintenance issues (prudent diet, regular exercise, health risks of tobacco and excessive alcohol, use of seatbelts, fire alarms in home, use of sunscreen).  Also reviewed age and gender appropriate health screening as well as vaccine recommendations. Vaccines: ALL UTD. Labs: cbc,cmet,flp,a1c Cervical ca screening: no further screening indicated due to age. Breast ca screening: next mammogram due 12/2021. Colon ca screening: no further screening is indicated due to age. Screening for osteoporosis: normal bone density on DEXA's 2017 and 2020.  Plan rpt 1 yr."  INTERIM HX: All labs stable last visit. Feels quite well.   DM 2: A1c stable at 7.6% last visit and pt chose to stay on diet mgmt alone. She is motivated---has lost 8 lbs purposefully by avoiding complex carbs a lot better over the last 3 mo. Fasting gluc's 95-115.  No postprandial checks.  Home bp checks consistently <130/80.  HLD: atorva 40 qd w/out problem. LDL 3 mo ago was 80.  No change in med recommended.  ROS as above, plus--> no fevers, no CP, no SOB, no wheezing, no cough, no dizziness, no HAs, no rashes, no melena/hematochezia.  No polyuria or polydipsia.  No myalgias or arthralgias.  No focal weakness, paresthesias, or tremors.  No acute vision or hearing abnormalities.  No  dysuria or unusual/new urinary urgency or frequency.  No recent changes in lower legs. No n/v/d or abd pain.  No palpitations.    Past Medical History:  Diagnosis Date   Corneal dystrophies, hereditary    Diabetes mellitus 10/2010   A1c 6.7%    Diverticulosis    Dry eye syndrome    Hyperlipemia    Hypertension    Hypothyroidism    Lumbar spondylosis 05/2016   x-ray   Macular degeneration, bilateral    Osteoarthritis    Presbyopia    Transfusion history 1959   post partum    Past Surgical History:  Procedure Laterality Date   APPENDECTOMY  1959   CATARACT EXTRACTION, BILATERAL     Dr Bing Plume   CESAREAN SECTION  [phone]; 1959   COLECTOMY     18 inches removed 2002-2003 for Diverticulitis   COLONOSCOPY     Tics,Dr Sam [place]--pt declines any further screening as of 12/2015.   DEXA  06/30/2016;10/13/19   2017 T score 2.5.  Rpt 2020 NORMAL. Rpt 3 yrs   VAGINAL HYSTERECTOMY  1975   For Fibroids (no BSO)    Outpatient Medications Prior to Visit  Medication Sig Dispense Refill   atorvastatin (LIPITOR) 40 MG tablet TAKE 1 TABLET EVERY DAY 90 tablet 1   cycloSPORINE (RESTASIS) 0.05 % ophthalmic emulsion Place 1 drop into both eyes 2 (two) times daily.     hydrochlorothiazide (HYDRODIURIL) 12.5 MG tablet TAKE 1 TABLET EVERY DAY 90 tablet 0  levothyroxine (SYNTHROID) 50 MCG tablet TAKE 1 TABLET EVERY DAY  EXCEPT TAKE 1/2 TABLET ON TUESDAY, THURSDAY, AND SATURDAY 84 tablet 1   ramipril (ALTACE) 5 MG capsule TAKE 1 CAPSULE EVERY DAY 90 capsule 1   No facility-administered medications prior to visit.    Allergies  Allergen Reactions   Doxycycline Hyclate Diarrhea   Penicillins     Rash Because of a history of documented adverse serious drug reaction;Medi Alert bracelet  is recommended   Tetanus Toxoid     REACTION: Arm swelling-severe Because of a history of documented adverse serious drug reaction;Medi Alert bracelet  is recommended   Metformin And Related Nausea And Vomiting  and Other (See Comments)    Abdominal pain    ROS As per HPI  PE: Vitals with BMI 04/21/2021 01/19/2021 08/03/2020  Height 5\' 5"  5\' 5"  5\' 6"   Weight 161 lbs 3 oz 168 lbs 6 oz 165 lbs  BMI 26.83 16.07 37.10  Systolic 626 948 -  Diastolic 80 80 -  Pulse 71 74 -     Gen: Alert, well appearing.  Patient is oriented to person, place, time, and situation. AFFECT: pleasant, lucid thought and speech. CV: RRR, no m/r/g.   LUNGS: CTA bilat, nonlabored resps, good aeration in all lung fields. EXT: no clubbing or cyanosis.  no edema.     LABS:  Lab Results  Component Value Date   TSH 0.86 10/24/2020   Lab Results  Component Value Date   WBC 5.5 01/19/2021   HGB 14.3 01/19/2021   HCT 42.3 01/19/2021   MCV 90.8 01/19/2021   PLT 199.0 01/19/2021   Lab Results  Component Value Date   CREATININE 0.72 01/19/2021   BUN 17 01/19/2021   NA 141 01/19/2021   K 4.2 01/19/2021   CL 101 01/19/2021   CO2 34 (H) 01/19/2021   Lab Results  Component Value Date   ALT 26 01/19/2021   AST 21 01/19/2021   ALKPHOS 70 01/19/2021   BILITOT 0.8 01/19/2021   Lab Results  Component Value Date   CHOL 160 01/19/2021   Lab Results  Component Value Date   HDL 59.10 01/19/2021   Lab Results  Component Value Date   LDLCALC 80 01/19/2021   Lab Results  Component Value Date   TRIG 102.0 01/19/2021   Lab Results  Component Value Date   CHOLHDL 3 01/19/2021   Lab Results  Component Value Date   HGBA1C 7.6 (H) 01/19/2021   POC Hba1c = 6.5%  IMPRESSION AND PLAN:  1) DM: diet controlled.  Diet signif improved and she has lost 8 lbs in the last 3 mo. POC Hba1c today 6.5%!  2) HTN: Electrolytes and sCr have been stable over the years so I feel comfortable today deferring these lab tests--we'll do them again at f/u in 3 mo.  3) HLD: cont atorvastatin 40 qd. LDL 3 mo ago 80, HDL 59. These levels are very reasonable in this pt w/out CAD who wishes to not change/start new med unless  absolutely necessary. Plan rpt FLP and hepatic panel 3 mo.  4) Hypothyroidism: taking T4 correctly. Last TSH 6 mo ago nl/stable.  Plan repeat 3-6 mo.  An After Visit Summary was printed and given to the patient.  FOLLOW UP: Return in about 3 months (around 07/22/2021) for routine chronic illness f/u. Next cpe 01/2022  Signed:  Crissie Sickles, MD           04/21/2021

## 2021-04-24 DIAGNOSIS — H353132 Nonexudative age-related macular degeneration, bilateral, intermediate dry stage: Secondary | ICD-10-CM | POA: Diagnosis not present

## 2021-05-11 ENCOUNTER — Other Ambulatory Visit: Payer: Self-pay

## 2021-05-11 MED ORDER — LEVOTHYROXINE SODIUM 50 MCG PO TABS: ORAL_TABLET | ORAL | 1 refills | Status: DC

## 2021-05-24 DIAGNOSIS — H353132 Nonexudative age-related macular degeneration, bilateral, intermediate dry stage: Secondary | ICD-10-CM | POA: Diagnosis not present

## 2021-06-23 DIAGNOSIS — H353132 Nonexudative age-related macular degeneration, bilateral, intermediate dry stage: Secondary | ICD-10-CM | POA: Diagnosis not present

## 2021-06-28 DIAGNOSIS — E1165 Type 2 diabetes mellitus with hyperglycemia: Secondary | ICD-10-CM | POA: Diagnosis not present

## 2021-06-28 DIAGNOSIS — H04123 Dry eye syndrome of bilateral lacrimal glands: Secondary | ICD-10-CM | POA: Diagnosis not present

## 2021-06-28 DIAGNOSIS — H353132 Nonexudative age-related macular degeneration, bilateral, intermediate dry stage: Secondary | ICD-10-CM | POA: Diagnosis not present

## 2021-06-28 DIAGNOSIS — H40003 Preglaucoma, unspecified, bilateral: Secondary | ICD-10-CM | POA: Diagnosis not present

## 2021-06-28 LAB — HM DIABETES EYE EXAM

## 2021-07-20 ENCOUNTER — Ambulatory Visit (INDEPENDENT_AMBULATORY_CARE_PROVIDER_SITE_OTHER): Payer: Medicare HMO | Admitting: Family Medicine

## 2021-07-20 ENCOUNTER — Other Ambulatory Visit: Payer: Self-pay

## 2021-07-20 ENCOUNTER — Encounter: Payer: Self-pay | Admitting: Family Medicine

## 2021-07-20 VITALS — BP 134/82 | HR 74 | Temp 97.5°F | Ht 65.0 in | Wt 158.0 lb

## 2021-07-20 DIAGNOSIS — E039 Hypothyroidism, unspecified: Secondary | ICD-10-CM

## 2021-07-20 DIAGNOSIS — I10 Essential (primary) hypertension: Secondary | ICD-10-CM | POA: Diagnosis not present

## 2021-07-20 DIAGNOSIS — Z23 Encounter for immunization: Secondary | ICD-10-CM | POA: Diagnosis not present

## 2021-07-20 DIAGNOSIS — E119 Type 2 diabetes mellitus without complications: Secondary | ICD-10-CM | POA: Diagnosis not present

## 2021-07-20 DIAGNOSIS — E7800 Pure hypercholesterolemia, unspecified: Secondary | ICD-10-CM | POA: Diagnosis not present

## 2021-07-20 LAB — HEMOGLOBIN A1C: Hgb A1c MFr Bld: 7.1 % — ABNORMAL HIGH (ref 4.6–6.5)

## 2021-07-20 LAB — COMPREHENSIVE METABOLIC PANEL
ALT: 21 U/L (ref 0–35)
AST: 19 U/L (ref 0–37)
Albumin: 3.8 g/dL (ref 3.5–5.2)
Alkaline Phosphatase: 67 U/L (ref 39–117)
BUN: 20 mg/dL (ref 6–23)
CO2: 33 mEq/L — ABNORMAL HIGH (ref 19–32)
Calcium: 9.2 mg/dL (ref 8.4–10.5)
Chloride: 101 mEq/L (ref 96–112)
Creatinine, Ser: 0.78 mg/dL (ref 0.40–1.20)
GFR: 69.36 mL/min (ref >60.00)
Glucose, Bld: 116 mg/dL — ABNORMAL HIGH (ref 70–99)
Potassium: 4 mEq/L (ref 3.5–5.1)
Sodium: 142 mEq/L (ref 135–145)
Total Bilirubin: 0.9 mg/dL (ref 0.2–1.2)
Total Protein: 6.2 g/dL (ref 6.0–8.3)

## 2021-07-20 LAB — LIPID PANEL
Cholesterol: 150 mg/dL (ref 0–200)
HDL: 59.1 mg/dL (ref >39.00)
LDL Cholesterol: 73 mg/dL (ref 0–99)
NonHDL: 90.63
Total CHOL/HDL Ratio: 3
Triglycerides: 87 mg/dL (ref 0.0–149.0)
VLDL: 17.4 mg/dL (ref 0.0–40.0)

## 2021-07-20 LAB — TSH: TSH: 0.33 u[IU]/mL — ABNORMAL LOW (ref 0.35–5.50)

## 2021-07-20 NOTE — Progress Notes (Signed)
OFFICE VISIT  07/20/2021  CC:  Chief Complaint  Patient presents with   Follow-up    [name], 3 mo. Pt is fasting   HPI:    Patient is a 87 y.o. Caucasian female who presents for 3 mo f/u diet controlled DM, HTN, HLD, and hypothyroidism. A/P as of last visit: "1) DM: diet controlled.  Diet signif improved and she has lost 8 lbs in the last 3 mo. POC Hba1c today 6.5%!   2) HTN: Electrolytes and sCr have been stable over the years so I feel comfortable today deferring these lab tests--we'll do them again at f/u in 3 mo.   3) HLD: cont atorvastatin 40 qd. LDL 3 mo ago 80, HDL 59. These levels are very reasonable in this pt w/out CAD who wishes to not change/start new med unless absolutely necessary. Plan rpt FLP and hepatic panel 3 mo.   4) Hypothyroidism: taking T4 correctly. Last TSH 6 mo ago nl/stable.  Plan repeat 3-6 mo."  INTERIM HX: Feeling well, remains active esp with taking care of chronically ill husband. Good diet.  DM: fasting 110-125.  No burning, tingling, or numbness in feet. QA:[id number] bp 120/60s HLD: taking atorva 40 qd Hypoth: Takes T4 on empty stomach w/out any other meds.  Past Medical History:  Diagnosis Date   Corneal dystrophies, hereditary    Diabetes mellitus 10/2010   A1c 6.7%    Diverticulosis    Dry eye syndrome    Hyperlipemia    Hypertension    Hypothyroidism    Lumbar spondylosis 05/2016   x-ray   Macular degeneration, bilateral    Osteoarthritis    Presbyopia    Transfusion history 1959   post partum    Past Surgical History:  Procedure Laterality Date   APPENDECTOMY  1959   CATARACT EXTRACTION, BILATERAL     Dr Bing Plume   CESAREAN SECTION  [phone]; 1959   COLECTOMY     18 inches removed 2002-2003 for Diverticulitis   COLONOSCOPY     Tics,Dr Sam [place]--pt declines any further screening as of 12/2015.   DEXA  06/30/2016;10/13/19   2017 T score 2.5.  Rpt 2020 NORMAL. Rpt 3 yrs   VAGINAL HYSTERECTOMY  1975   For Fibroids (no BSO)     Outpatient Medications Prior to Visit  Medication Sig Dispense Refill   atorvastatin (LIPITOR) 40 MG tablet TAKE 1 TABLET EVERY DAY 90 tablet 1   cycloSPORINE (RESTASIS) 0.05 % ophthalmic emulsion Place 1 drop into both eyes 2 (two) times daily.     hydrochlorothiazide (HYDRODIURIL) 12.5 MG tablet TAKE 1 TABLET EVERY DAY 90 tablet 0   levothyroxine (SYNTHROID) 50 MCG tablet TAKE 1 TABLET EVERY DAY  EXCEPT TAKE 1/2 TABLET ON TUESDAY, THURSDAY, AND SATURDAY 84 tablet 1   ramipril (ALTACE) 5 MG capsule TAKE 1 CAPSULE EVERY DAY 90 capsule 1   No facility-administered medications prior to visit.    Allergies  Allergen Reactions   Doxycycline Hyclate Diarrhea   Penicillins     Rash Because of a history of documented adverse serious drug reaction;Medi Alert bracelet  is recommended   Tetanus Toxoid     REACTION: Arm swelling-severe Because of a history of documented adverse serious drug reaction;Medi Alert bracelet  is recommended   Metformin And Related Nausea And Vomiting and Other (See Comments)    Abdominal pain    ROS As per HPI  PE: Vitals with BMI 07/20/2021 04/21/2021 01/19/2021  Height '5\' 5"'$  '5\' 5"'$  '5\' 5"'$   Weight 158 lbs 161 lbs 3 oz 168 lbs 6 oz  BMI 26.29 [id number]  Systolic [id number] [id number]  Diastolic 82 80 80  Pulse 74 71 74   Gen: Alert, well appearing.  Patient is oriented to person, place, time, and situation. AFFECT: pleasant, lucid thought and speech. CV: RRR, no m/r/g.   LUNGS: CTA bilat, nonlabored resps, good aeration in all lung fields. EXT: no clubbing or cyanosis.  no edema.  Foot exam - no swelling, tenderness or skin or vascular lesions. Color and temperature is normal. Sensation is intact. Peripheral pulses are palpable. Toenails are normal.  LABS:  Lab Results  Component Value Date   TSH 0.86 10/24/2020   Lab Results  Component Value Date   WBC 5.5 01/19/2021   HGB 14.3 01/19/2021   HCT 42.3 01/19/2021   MCV 90.8 01/19/2021   PLT 199.0  01/19/2021   Lab Results  Component Value Date   CREATININE 0.72 01/19/2021   BUN 17 01/19/2021   NA 141 01/19/2021   K 4.2 01/19/2021   CL 101 01/19/2021   CO2 34 (H) 01/19/2021   Lab Results  Component Value Date   ALT 26 01/19/2021   AST 21 01/19/2021   ALKPHOS 70 01/19/2021   BILITOT 0.8 01/19/2021   Lab Results  Component Value Date   CHOL 160 01/19/2021   Lab Results  Component Value Date   HDL 59.10 01/19/2021   Lab Results  Component Value Date   LDLCALC 80 01/19/2021   Lab Results  Component Value Date   TRIG 102.0 01/19/2021   Lab Results  Component Value Date   CHOLHDL 3 01/19/2021   Lab Results  Component Value Date   HGBA1C 6.5 (A) 04/21/2021   HGBA1C 6.5 04/21/2021   HGBA1C 6.5 (A) 04/21/2021   HGBA1C 6.5 04/21/2021   IMPRESSION AND PLAN:  1) DM, diet controlled, no complications. Feet exam normal today. Hba1c, microalb/cr, and lytes/cr today.  2) HTN: well controlled on ramipril '5mg'$  qd and hctz 12.'5mg'$  qd. Lytes/cr today.  3) HLD: tolerating atorva 40 qd. FLP and hepatic panel today.  4) Hypothyroidism: 50 mcg qd T4 except 1/2 tab T/R/sat TSH today.  An After Visit Summary was printed and given to the patient.  FOLLOW UP: Return in about 3 months (around 10/19/2021) for routine chronic illness f/u. Cpe 01/2022  Signed:  Crissie Sickles, MD           07/20/2021

## 2021-07-21 LAB — MICROALBUMIN / CREATININE URINE RATIO
Creatinine,U: 230.2 mg/dL
Microalb Creat Ratio: 0.8 mg/g (ref 0.0–30.0)
Microalb, Ur: 1.9 mg/dL (ref 0.0–1.9)

## 2021-08-09 ENCOUNTER — Ambulatory Visit: Payer: Medicare HMO

## 2021-08-10 ENCOUNTER — Ambulatory Visit (INDEPENDENT_AMBULATORY_CARE_PROVIDER_SITE_OTHER): Payer: Medicare HMO | Admitting: *Deleted

## 2021-08-10 DIAGNOSIS — Z0000 Encounter for general adult medical examination without abnormal findings: Secondary | ICD-10-CM | POA: Diagnosis not present

## 2021-08-10 NOTE — Progress Notes (Signed)
Subjective:   Shannon Greene is a 87 y.o. female who presents for Medicare Annual (Subsequent) preventive examination.  I connected with  Lira Stephen on 08/10/21 by a telephoneenabled telemedicine application and verified that I am speaking with the correct person using two identifiers.   I discussed the limitations of evaluation and management by telemedicine. The patient expressed understanding and agreed to proceed.   Review of Systems     Cardiac Risk Factors include: advanced age (>58men, >70 women);diabetes mellitus;hypertension;obesity (BMI >30kg/m2);sedentary lifestyle     Objective:    Today's Vitals   There is no height or weight on file to calculate BMI.  Advanced Directives 08/10/2021 08/03/2020 07/30/2019 07/04/2018 06/24/2017  Does Patient Have a Medical Advance Directive? Yes Yes Yes Yes Yes  Type of Paramedic of [place];Living will [place];Living will [place];Living will Living will;Healthcare Power of [place] in Chart? Yes - validated most recent copy scanned in chart (See row information) No - copy requested No - copy requested No - copy requested No - copy requested    Current Medications (verified) Outpatient Encounter Medications as of 08/10/2021  Medication Sig   atorvastatin (LIPITOR) 40 MG tablet TAKE 1 TABLET EVERY DAY   cycloSPORINE (RESTASIS) 0.05 % ophthalmic emulsion Place 1 drop into both eyes 2 (two) times daily.   hydrochlorothiazide (HYDRODIURIL) 12.5 MG tablet TAKE 1 TABLET EVERY DAY   levothyroxine (SYNTHROID) 50 MCG tablet TAKE 1 TABLET EVERY DAY  EXCEPT TAKE 1/2 TABLET ON TUESDAY, THURSDAY, AND SATURDAY   ramipril (ALTACE) 5 MG capsule TAKE 1 CAPSULE EVERY DAY   No facility-administered encounter medications on file as of 08/10/2021.    Allergies (verified) Doxycycline hyclate, Penicillins,  Tetanus toxoid, and Metformin and related   History: Past Medical History:  Diagnosis Date   Corneal dystrophies, hereditary    Diabetes mellitus 10/2010   A1c 6.7%    Diverticulosis    Dry eye syndrome    Hyperlipemia    Hypertension    Hypothyroidism    Lumbar spondylosis 05/2016   x-ray   Macular degeneration, bilateral    Osteoarthritis    Presbyopia    Transfusion history 1959   post partum   Past Surgical History:  Procedure Laterality Date   APPENDECTOMY  1959   CATARACT EXTRACTION, BILATERAL     Dr Bing Plume   CESAREAN SECTION  [id number]; 1959   COLECTOMY     18 inches removed 2002-2003 for Diverticulitis   COLONOSCOPY     Tics,Dr Sam Okolona--pt declines any further screening as of 12/2015.   DEXA  06/30/2016;10/13/19   2017 T score 2.5.  Rpt 2020 NORMAL. Rpt 3 yrs   VAGINAL HYSTERECTOMY  1975   For Fibroids (no BSO)   Family History  Problem Relation Age of Onset   Heart attack Mother 59   Hypertension Mother    Tuberculosis Father    Diabetes Brother    Stroke Brother 72   Prostate cancer Brother    Breast cancer Neg Hx    Social History   Socioeconomic History   Marital status: Married    Spouse name: Not on file   Number of children: Not on file   Years of education: Not on file   Highest education level: Not on file  Occupational History   Not on file  Tobacco Use   Smoking status: Never   Smokeless  tobacco: Never  Vaping Use   Vaping Use: Never used  Substance and Sexual Activity   Alcohol use: No   Drug use: No   Sexual activity: Not Currently  Other Topics Concern   Not on file  Social History Narrative   Married, 2 daughters.   Occupation: retired Facilities manager).   Silver sneakers.   No T/A/Ds.   Social Determinants of Health   Financial Resource Strain: Low Risk    Difficulty of Paying Living Expenses: Not hard at all  Food Insecurity: No Food Insecurity   Worried About Charity fundraiser in the Last Year: Never true    [place] in the Last Year: Never true  Transportation Needs: No Transportation Needs   Lack of Transportation (Medical): No   Lack of Transportation (Non-Medical): No  Physical Activity: Inactive   Days of Exercise per Week: 0 days   Minutes of Exercise per Session: 0 min  Stress: No Stress Concern Present   Feeling of Stress : Not at all  Social Connections: Socially Integrated   Frequency of Communication with Friends and Family: Three times a week   Frequency of Social Gatherings with Friends and Family: More than three times a week   Attends Religious Services: More than 4 times per year   Active Member of [employer] or Organizations: Yes   Attends Archivist Meetings: 1 to 4 times per year   Marital Status: Married    Tobacco Counseling Counseling given: Not Answered   Clinical Intake:  Pre-visit preparation completed: Yes  Pain : No/denies pain     Nutritional Risks: None Diabetes: Yes (controlled with diet) CBG done?: No Did pt. bring in CBG monitor from home?: No  How often do you need to have someone help you when you read instructions, pamphlets, or other written materials from your doctor or pharmacy?: 1 - Never  Diabetic?yes  Nutrition Risk Assessment:  Has the patient had any N/V/D within the last 2 months?  No  Does the patient have any non-healing wounds?  No  Has the patient had any unintentional weight loss or weight gain?  No   Diabetes:  Is the patient diabetic?  Yes  If diabetic, was a CBG obtained today?  No  Did the patient bring in their glucometer from home?  No  How often do you monitor your CBG's? 3x a week.   Financial Strains and Diabetes Management:  Are you having any financial strains with the device, your supplies or your medication? No .  Does the patient want to be seen by Chronic Care Management for management of their diabetes?  No  Would the patient like to be referred to a Nutritionist or for Diabetic  Management?  No   Diabetic Exams:  Diabetic Eye Exam: Completed . Overdue for diabetic eye exam. Pt has been advised about the importance in completing this exam. Diabetic Foot Exam: Completed . Pt has been advised about the importance in completing this exam.  Interpreter Needed?: No  Information entered by :: Leroy Kennedy LPN   Activities of Daily Living In your present state of health, do you have any difficulty performing the following activities: 08/10/2021  Hearing? N  Vision? N  Difficulty concentrating or making decisions? N  Walking or climbing stairs? N  Dressing or bathing? N  Doing errands, shopping? N  Preparing Food and eating ? N  Using the Toilet? N  In the past six months, have you accidently  leaked urine? N  Do you have problems with loss of bowel control? N  Managing your Medications? N  Managing your Finances? N  Housekeeping or managing your Housekeeping? N  Some recent data might be hidden    Patient Care Team: Tammi Sou, MD as PCP - General (Family Medicine) Calvert Cantor, MD as Consulting Physician (Ophthalmology)  Indicate any recent Medical Services you may have received from other than [facility] providers in the past year (date may be approximate).     Assessment:   This is a routine wellness examination for [place].  Hearing/Vision screen Hearing Screening - Comments:: No trouble hearing Vision Screening - Comments:: Dr. Bing Plume Up to date  Dietary issues and exercise activities discussed: Current Exercise Habits: The patient does not participate in regular exercise at present, Exercise limited by: None identified   Goals Addressed   None    Depression Screen PHQ 2/9 Scores 08/10/2021 01/19/2021 08/03/2020 07/30/2019 01/13/2019 07/04/2018 06/24/2017  PHQ - 2 Score 0 0 0 0 0 0 0  Exception Documentation - - - - - - -    Fall Risk Fall Risk  08/10/2021 01/19/2021 08/03/2020 07/30/2019 01/13/2019  Falls in the past year? 0 0 0 0 0  Number falls  in past yr: 0 0 0 0 -  Injury with Fall? 0 0 0 0 -  Follow up Falls evaluation completed;Falls prevention discussed Falls evaluation completed Falls prevention discussed Falls prevention discussed -    FALL RISK PREVENTION PERTAINING TO THE HOME:  Any stairs in or around the home? No  If so, are there any without handrails? No  Home free of loose throw rugs in walkways, pet beds, electrical cords, etc? Yes  Adequate lighting in your home to reduce risk of falls? Yes   ASSISTIVE DEVICES UTILIZED TO PREVENT FALLS:  Life alert? No  Use of a cane, walker or w/c? No  Grab bars in the bathroom? Yes  Shower chair or bench in shower? Yes  Elevated toilet seat or a handicapped toilet? Yes   TIMED UP AND GO:  Was the test performed? No .    Cognitive Function:  Normal cognitive status assessed by direct observation by this Nurse Health Advisor. No abnormalities found.   MMSE - Mini Mental State Exam 07/30/2019 07/04/2018  Orientation to time 5 5  Orientation to Place 5 5  Registration 3 3  Attention/ Calculation 5 5  Recall 3 3  Language- name 2 objects 2 2  Language- repeat 1 1  Language- follow 3 step command 3 3  Language- read & follow direction 1 1  Write a sentence 1 1  Copy design 1 1  Total score 30 30        Immunizations Immunization History  Administered Date(s) Administered   Fluad Quad(high Dose 65+) 07/14/2019, 07/21/2020, 07/20/2021   Influenza Whole 07/06/2012   Influenza, High Dose Seasonal PF 07/19/2014, 07/28/2015, 08/05/2017, 07/15/2018   Influenza,inj,Quad PF,6+ Mos 07/31/2016   PFIZER(Purple Top)SARS-COV-2 Vaccination 12/17/2019, 01/09/2020   Pneumococcal Conjugate-13 12/16/2015   Pneumococcal-Unspecified 11/05/2001   Td 12/24/2016   Zoster Recombinat (Shingrix) 03/29/2017, 08/12/2017   Zoster, Live 06/21/2016    TDAP status: Up to date  Flu Vaccine status: Up to date  Pneumococcal vaccine status: Up to date  Covid-19 vaccine status:  Information provided on how to obtain vaccines.   Qualifies for Shingles Vaccine? No   Zostavax completed Yes   Shingrix Completed?: Yes  Screening Tests Health Maintenance  Topic Date Due  COVID-19 Vaccine (3 - Booster for Pfizer series) 06/10/2020   HEMOGLOBIN A1C  01/17/2022   OPHTHALMOLOGY EXAM  06/28/2022   FOOT EXAM  07/20/2022   DEXA SCAN  10/11/2022   INFLUENZA VACCINE  Completed   Zoster Vaccines- Shingrix  Completed   HPV VACCINES  Aged Out    Health Maintenance  Health Maintenance Due  Topic Date Due   COVID-19 Vaccine (3 - Booster for Pfizer series) 06/10/2020    Colorectal cancer screening: No longer required.   Mammogram status: Completed  . Repeat every year  Bone Density status: Completed  . Results reflect: Bone density results: OSTEOPOROSIS. Repeat every 2 years.  Lung Cancer Screening: (Low Dose CT Chest recommended if Age 36-80 years, 30 pack-year currently smoking OR have quit w/in 15years.) does not qualify.   Lung Cancer Screening Referral:   Additional Screening:  Hepatitis C Screening: does not qualify;   Vision Screening: Recommended annual ophthalmology exams for early detection of glaucoma and other disorders of the eye. Is the patient up to date with their annual eye exam?  Yes  Who is the provider or what is the name of the office in which the patient attends annual eye exams? Dr. Bing Plume If pt is not established with a provider, would they like to be referred to a provider to establish care? Yes .   Dental Screening: Recommended annual dental exams for proper oral hygiene  Community Resource Referral / Chronic Care Management: CRR required this visit?  No   CCM required this visit?  No      Plan:     I have personally reviewed and noted the following in the patient's chart:   Medical and social history Use of alcohol, tobacco or illicit drugs  Current medications and supplements including opioid prescriptions.  Functional  ability and status Nutritional status Physical activity Advanced directives List of other physicians Hospitalizations, surgeries, and ER visits in previous 12 months Vitals Screenings to include cognitive, depression, and falls Referrals and appointments  In addition, I have reviewed and discussed with patient certain preventive protocols, quality metrics, and best practice recommendations. A written personalized care plan for preventive services as well as general preventive health recommendations were provided to patient.     Leroy Kennedy, LPN   [id number]   Nurse Notes:

## 2021-08-10 NOTE — Patient Instructions (Signed)
[name] , Thank you for taking time to come for your Medicare Wellness Visit. I appreciate your ongoing commitment to your health goals. Please review the following plan we discussed and let me know if I can assist you in the future.   Screening recommendations/referrals: Colonoscopy: not indicated Mammogram: up to date Bone Density: up to date Recommended yearly ophthalmology/optometry visit for glaucoma screening and checkup Recommended yearly dental visit for hygiene and checkup  Vaccinations: Influenza vaccine: up to date Pneumococcal vaccine: up to date Tdap vaccine: up to date Shingles vaccine: up to date    Advanced directives: Education provided  Conditions/risks identified:   Next appointment: [phone] @ 9:30  Dr. Anitra Lauth   Preventive Care 85 Years and Older, Female Preventive care refers to lifestyle choices and visits with your health care provider that can promote health and wellness. What does preventive care include? A yearly physical exam. This is also called an annual well check. Dental exams once or twice a year. Routine eye exams. Ask your health care provider how often you should have your eyes checked. Personal lifestyle choices, including: Daily care of your teeth and gums. Regular physical activity. Eating a healthy diet. Avoiding tobacco and drug use. Limiting alcohol use. Practicing safe sex. Taking low-dose aspirin every day. Taking vitamin and mineral supplements as recommended by your health care provider. What happens during an annual well check? The services and screenings done by your health care provider during your annual well check will depend on your age, overall health, lifestyle risk factors, and family history of disease. Counseling  Your health care provider may ask you questions about your: Alcohol use. Tobacco use. Drug use. Emotional well-being. Home and relationship well-being. Sexual activity. Eating habits. History of  falls. Memory and ability to understand (cognition). Work and work Statistician. Reproductive health. Screening  You may have the following tests or measurements: Height, weight, and BMI. Blood pressure. Lipid and cholesterol levels. These may be checked every 5 years, or more frequently if you are over 77 years old. Skin check. Lung cancer screening. You may have this screening every year starting at age 68 if you have a 30-pack-year history of smoking and currently smoke or have quit within the past 15 years. Fecal occult blood test (FOBT) of the stool. You may have this test every year starting at age 18. Flexible sigmoidoscopy or colonoscopy. You may have a sigmoidoscopy every 5 years or a colonoscopy every 10 years starting at age 53. Hepatitis C blood test. Hepatitis B blood test. Sexually transmitted disease (STD) testing. Diabetes screening. This is done by checking your blood sugar (glucose) after you have not eaten for a while (fasting). You may have this done every 1-3 years. Bone density scan. This is done to screen for osteoporosis. You may have this done starting at age 68. Mammogram. This may be done every 1-2 years. Talk to your health care provider about how often you should have regular mammograms. Talk with your health care provider about your test results, treatment options, and if necessary, the need for more tests. Vaccines  Your health care provider may recommend certain vaccines, such as: Influenza vaccine. This is recommended every year. Tetanus, diphtheria, and acellular pertussis (Tdap, Td) vaccine. You may need a Td booster every 10 years. Zoster vaccine. You may need this after age 38. Pneumococcal 13-valent conjugate (PCV13) vaccine. One dose is recommended after age 64. Pneumococcal polysaccharide (PPSV23) vaccine. One dose is recommended after age 54. Talk to your health  care provider about which screenings and vaccines you need and how often you need  them. This information is not intended to replace advice given to you by your health care provider. Make sure you discuss any questions you have with your health care provider. Document Released: 11/18/2015 Document Revised: 07/11/2016 Document Reviewed: 08/23/2015 Elsevier Interactive Patient Education  2017 [employer] Prevention in the Home Falls can cause injuries. They can happen to people of all ages. There are many things you can do to make your home safe and to help prevent falls. What can I do on the outside of my home? Regularly fix the edges of walkways and driveways and fix any cracks. Remove anything that might make you trip as you walk through a door, such as a raised step or threshold. Trim any bushes or trees on the path to your home. Use bright outdoor lighting. Clear any walking paths of anything that might make someone trip, such as rocks or tools. Regularly check to see if handrails are loose or broken. Make sure that both sides of any steps have handrails. Any raised decks and porches should have guardrails on the edges. Have any leaves, snow, or ice cleared regularly. Use sand or salt on walking paths during winter. Clean up any spills in your garage right away. This includes oil or grease spills. What can I do in the bathroom? Use night lights. Install grab bars by the toilet and in the tub and shower. Do not use towel bars as grab bars. Use non-skid mats or decals in the tub or shower. If you need to sit down in the shower, use a plastic, non-slip stool. Keep the floor dry. Clean up any water that spills on the floor as soon as it happens. Remove soap buildup in the tub or shower regularly. Attach bath mats securely with double-sided non-slip rug tape. Do not have throw rugs and other things on the floor that can make you trip. What can I do in the bedroom? Use night lights. Make sure that you have a light by your bed that is easy to reach. Do not use  any sheets or blankets that are too big for your bed. They should not hang down onto the floor. Have a firm chair that has side arms. You can use this for support while you get dressed. Do not have throw rugs and other things on the floor that can make you trip. What can I do in the kitchen? Clean up any spills right away. Avoid walking on wet floors. Keep items that you use a lot in easy-to-reach places. If you need to reach something above you, use a strong step stool that has a grab bar. Keep electrical cords out of the way. Do not use floor polish or wax that makes floors slippery. If you must use wax, use non-skid floor wax. Do not have throw rugs and other things on the floor that can make you trip. What can I do with my stairs? Do not leave any items on the stairs. Make sure that there are handrails on both sides of the stairs and use them. Fix handrails that are broken or loose. Make sure that handrails are as long as the stairways. Check any carpeting to make sure that it is firmly attached to the stairs. Fix any carpet that is loose or worn. Avoid having throw rugs at the top or bottom of the stairs. If you do have throw rugs, attach them to  the floor with carpet tape. Make sure that you have a light switch at the top of the stairs and the bottom of the stairs. If you do not have them, ask someone to add them for you. What else can I do to help prevent falls? Wear shoes that: Do not have high heels. Have rubber bottoms. Are comfortable and fit you well. Are closed at the toe. Do not wear sandals. If you use a stepladder: Make sure that it is fully opened. Do not climb a closed stepladder. Make sure that both sides of the stepladder are locked into place. Ask someone to hold it for you, if possible. Clearly mark and make sure that you can see: Any grab bars or handrails. First and last steps. Where the edge of each step is. Use tools that help you move around (mobility aids)  if they are needed. These include: Canes. Walkers. Scooters. Crutches. Turn on the lights when you go into a dark area. Replace any light bulbs as soon as they burn out. Set up your furniture so you have a clear path. Avoid moving your furniture around. If any of your floors are uneven, fix them. If there are any pets around you, be aware of where they are. Review your medicines with your doctor. Some medicines can make you feel dizzy. This can increase your chance of falling. Ask your doctor what other things that you can do to help prevent falls. This information is not intended to replace advice given to you by your health care provider. Make sure you discuss any questions you have with your health care provider. Document Released: 08/18/2009 Document Revised: 03/29/2016 Document Reviewed: 11/26/2014 Elsevier Interactive Patient Education  2017 [employer].

## 2021-09-12 ENCOUNTER — Other Ambulatory Visit: Payer: Self-pay | Admitting: Family Medicine

## 2021-10-12 ENCOUNTER — Telehealth: Payer: Self-pay | Admitting: Family Medicine

## 2021-10-13 NOTE — Telephone Encounter (Signed)
A user error has taken place: encounter opened in error, closed for administrative reasons.

## 2021-10-19 ENCOUNTER — Encounter: Payer: Self-pay | Admitting: Family Medicine

## 2021-10-19 ENCOUNTER — Other Ambulatory Visit: Payer: Self-pay

## 2021-10-19 ENCOUNTER — Ambulatory Visit (INDEPENDENT_AMBULATORY_CARE_PROVIDER_SITE_OTHER): Payer: Medicare HMO | Admitting: Family Medicine

## 2021-10-19 VITALS — BP 126/62 | HR 76 | Temp 97.7°F | Ht 65.0 in | Wt 154.4 lb

## 2021-10-19 DIAGNOSIS — Z23 Encounter for immunization: Secondary | ICD-10-CM | POA: Diagnosis not present

## 2021-10-19 DIAGNOSIS — E7800 Pure hypercholesterolemia, unspecified: Secondary | ICD-10-CM

## 2021-10-19 DIAGNOSIS — E119 Type 2 diabetes mellitus without complications: Secondary | ICD-10-CM | POA: Diagnosis not present

## 2021-10-19 DIAGNOSIS — I10 Essential (primary) hypertension: Secondary | ICD-10-CM

## 2021-10-19 DIAGNOSIS — E039 Hypothyroidism, unspecified: Secondary | ICD-10-CM

## 2021-10-19 LAB — POCT GLYCOSYLATED HEMOGLOBIN (HGB A1C)
HbA1c POC (<> result, manual entry): 6.5 % (ref 4.0–5.6)
HbA1c, POC (controlled diabetic range): 6.5 % (ref 0.0–7.0)
HbA1c, POC (prediabetic range): 6.5 % — AB (ref 5.7–6.4)
Hemoglobin A1C: 6.5 % — AB (ref 4.0–5.6)

## 2021-10-19 MED ORDER — LEVOTHYROXINE SODIUM 50 MCG PO TABS: ORAL_TABLET | ORAL | 3 refills | Status: DC

## 2021-10-19 MED ORDER — ATORVASTATIN CALCIUM 40 MG PO TABS: 40.0000 mg | ORAL_TABLET | Freq: Every day | ORAL | 3 refills | Status: DC

## 2021-10-19 MED ORDER — RAMIPRIL 5 MG PO CAPS: 5.0000 mg | ORAL_CAPSULE | Freq: Every day | ORAL | 3 refills | Status: DC

## 2021-10-19 NOTE — Progress Notes (Signed)
See student note for this encounter. I have attested it. Signed:  Crissie Sickles, MD           10/19/2021

## 2021-10-19 NOTE — Progress Notes (Signed)
OFFICE VISIT  10/19/2021  CC:  Chief Complaint  Patient presents with   Follow-up    [name]; pt is not fasting   HPI:    Patient is a 87 y.o. female who presents for 3 mo f/u diet controlled DM, HTN, HLD, and hypothyroidism. A/P as of last visit: "1) DM, diet controlled, no complications. Feet exam normal today. Hba1c, microalb/cr, and lytes/cr today.   2) HTN: well controlled on ramipril 5mg  qd and hctz 12.5mg  qd. Lytes/cr today.   3) HLD: tolerating atorva 40 qd. FLP and hepatic panel today.   4) Hypothyroidism: 50 mcg qd T4 except 1/2 tab T/R/sat TSH today."   INTERIM HX: A1c up to 7.1% last visit, continued on diet only. All other labs great.  1) DM, diet controlled: She continues to eat a healthy diet and mentioned she has cut out rice, bread, and potatoes. She was excited to hear that she lost four pounds which motivates her even more to continue her healthy eating habits. She denies excessive thirst, fatigue, headache. Informed her that her A1c is down to 6.5% (7.1% - October 2022) as well and she gave [place] a fist pound.   2) HTN: Patient continues to tolerate ramipril 5mg  qd and hctz 12.5mg  qd. Her home BP have been around 120/80 and she denies changes in vision or headache.    3) HLD: Continues to tolerate atorva 40 qd. Denies muscle pain or GI issues   4) Hypothyroidism: Continues to take 50 mcg qd T4 except 1/2 tab T/R/sat. Denies depressed mood, cold intolerance, or  feeling fatigue.   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   DEXA  06/30/2016;10/13/19   2017 T score 2.5.  Rpt 2020 NORMAL. Rpt 3 yrs   VAGINAL HYSTERECTOMY  1975   For Fibroids (no BSO)    Outpatient Medications Prior to Visit  Medication Sig Dispense Refill   cycloSPORINE (RESTASIS) 0.05 % ophthalmic emulsion Place 1 drop into both eyes 2 (two) times daily.     hydrochlorothiazide (HYDRODIURIL) 12.5 MG tablet TAKE 1 TABLET EVERY DAY 90 tablet 0   atorvastatin (LIPITOR) 40 MG tablet TAKE 1 TABLET EVERY DAY 90 tablet 1   levothyroxine (SYNTHROID) 50 MCG tablet TAKE 1 TABLET EVERY DAY  EXCEPT TAKE 1/2 TABLET ON TUESDAY, THURSDAY, AND SATURDAY 84 tablet 1   ramipril (ALTACE) 5 MG capsule TAKE 1 CAPSULE EVERY DAY 90 capsule 1   No facility-administered medications prior to visit.    Allergies  Allergen Reactions   Doxycycline Hyclate Diarrhea   Penicillins     Rash Because of a history of documented adverse serious drug reaction;Medi Alert bracelet  is recommended   Tetanus Toxoid  REACTION: Arm swelling-severe Because of a history of documented adverse serious drug reaction;Medi Alert bracelet  is recommended   Metformin And Related Nausea And Vomiting and Other (See Comments)    Abdominal pain    ROS As per HPI  PE: Vitals with BMI 10/19/2021 08/10/2021 07/20/2021  Height 5\' 5"  (No Data) 5\' 5"   Weight 154 lbs 6 oz (No Data) 158 lbs  BMI 82.42 - 35.36  Systolic 144 (No Data) 315  Diastolic 62 (No Data) 82  Pulse 76 - 74    Physical Exam Constitutional:      Appearance: Normal appearance.  Cardiovascular:     Rate and Rhythm: Normal rate and regular rhythm.     Heart sounds: Normal heart sounds.  Pulmonary:     Effort: Pulmonary effort is normal.     Breath sounds: Normal breath sounds.   Neurological:     Mental Status: She is alert.  Psychiatric:        Mood and Affect: Mood normal.        Behavior: Behavior normal.    LABS:  Last CBC Lab Results  Component Value Date   WBC 5.5 01/19/2021   HGB 14.3 01/19/2021   HCT 42.3 01/19/2021   MCV 90.8 01/19/2021   RDW 13.1 01/19/2021   PLT 199.0 40/06/6760   Last metabolic panel Lab Results  Component Value Date   GLUCOSE 116 (H) 07/20/2021   NA 142 07/20/2021   K 4.0 07/20/2021   CL 101 07/20/2021   CO2 33 (H) 07/20/2021   BUN 20 07/20/2021   CREATININE 0.78 07/20/2021   CALCIUM 9.2 07/20/2021   PROT 6.2 07/20/2021   ALBUMIN 3.8 07/20/2021   BILITOT 0.9 07/20/2021   ALKPHOS 67 07/20/2021   AST 19 07/20/2021   ALT 21 07/20/2021   Last lipids Lab Results  Component Value Date   CHOL 150 07/20/2021   HDL 59.10 07/20/2021   LDLCALC 73 07/20/2021   TRIG 87.0 07/20/2021   CHOLHDL 3 07/20/2021   Last hemoglobin A1c Lab Results  Component Value Date   HGBA1C 6.5 (A) 10/19/2021   HGBA1C 6.5 10/19/2021   HGBA1C 6.5 (A) 10/19/2021   HGBA1C 6.5 10/19/2021   Last thyroid functions Lab Results  Component Value Date   TSH 0.33 (L) 07/20/2021    IMPRESSION AND PLAN: Non-toxic appearing female who presents for routine f/u for DM, HTN, Hyperlipidemia, and hypothyroidism  #1 diabetes type 2, diet controlled. Point-of-care hemoglobin A1c today: 6.5% (down from 7.1 at previous visit). Patient encouraged to continue her healthy eating habits.  #2 hypertension: stable. 126/62 today. Continue Ramipril 5 mg and HCTZ 12.5 mg daily and measuring home BP. No electrolyte/cr labs today.  #3 hyperlipidemia: stable. Last Lipid panel (October 2022) yielded a LDL of 71. Continue atorvastatin 40 mg a day. Will recheck lipids at next visit.  #4 Hypothyroidism: well controlled. Patient denies fatigue, weight gain, depressed mood, or cold intolerance. Continue Synthroid 50 mcg 50 mcg qd T4 except 1/2 tab T/R/sat  Lab  Orders         POCT glycosylated hemoglobin (Hb A1C)      Pneumoniae vaccine today  An After Visit Summary was printed and given to the patient.  FOLLOW UP: Return in about 3 months (around 01/17/2022) for annual CPE (fasting).  Phil Dopp - MS3  Signed:  Crissie Sickles, MD           10/19/2021

## 2021-11-16 DIAGNOSIS — Z1152 Encounter for screening for COVID-19: Secondary | ICD-10-CM | POA: Diagnosis not present

## 2021-11-16 DIAGNOSIS — Z9189 Other specified personal risk factors, not elsewhere classified: Secondary | ICD-10-CM | POA: Diagnosis not present

## 2021-11-16 DIAGNOSIS — U071 COVID-19: Secondary | ICD-10-CM | POA: Diagnosis not present

## 2021-11-17 ENCOUNTER — Other Ambulatory Visit: Payer: Self-pay

## 2021-11-17 ENCOUNTER — Telehealth: Payer: Self-pay | Admitting: Family Medicine

## 2021-11-17 MED ORDER — MOLNUPIRAVIR EUA 200MG CAPSULE: 4.0000 | ORAL_CAPSULE | Freq: Two times a day (BID) | ORAL | 0 refills | Status: AC

## 2021-11-17 NOTE — Telephone Encounter (Signed)
Pt currently has nasal congestion, sore throat. Denies any other symptoms. She was given paxlovid and has been using tylenol for symptoms. She was unable to take paxlovid yesterday because it caused her to vomit.   Please advise.

## 2021-11-17 NOTE — Telephone Encounter (Signed)
OK, I'll send in the other covid antiviral to see if she tolerates it better ([facility])

## 2021-11-17 NOTE — Telephone Encounter (Signed)
Pt advised of rx instructions. °

## 2021-11-17 NOTE — Telephone Encounter (Signed)
Pt called said she went to Urgent Care yesterday, she was tested for COVID.   I informed pt we could do a mychart video, pt wanted to know how can she treat her symptoms. Pt wanted to speak to [facility]: [phone]

## 2021-11-28 ENCOUNTER — Other Ambulatory Visit: Payer: Self-pay | Admitting: Family Medicine

## 2021-11-28 DIAGNOSIS — Z1231 Encounter for screening mammogram for malignant neoplasm of breast: Secondary | ICD-10-CM

## 2021-12-19 DIAGNOSIS — H18593 Other hereditary corneal dystrophies, bilateral: Secondary | ICD-10-CM | POA: Diagnosis not present

## 2021-12-19 DIAGNOSIS — H353132 Nonexudative age-related macular degeneration, bilateral, intermediate dry stage: Secondary | ICD-10-CM | POA: Diagnosis not present

## 2021-12-19 DIAGNOSIS — H40003 Preglaucoma, unspecified, bilateral: Secondary | ICD-10-CM | POA: Diagnosis not present

## 2021-12-19 DIAGNOSIS — E1165 Type 2 diabetes mellitus with hyperglycemia: Secondary | ICD-10-CM | POA: Diagnosis not present

## 2022-01-05 ENCOUNTER — Ambulatory Visit: Payer: Medicare HMO

## 2022-01-18 ENCOUNTER — Ambulatory Visit (INDEPENDENT_AMBULATORY_CARE_PROVIDER_SITE_OTHER): Payer: Medicare HMO | Admitting: Family Medicine

## 2022-01-18 ENCOUNTER — Encounter: Payer: Self-pay | Admitting: Family Medicine

## 2022-01-18 ENCOUNTER — Other Ambulatory Visit: Payer: Self-pay

## 2022-01-18 VITALS — BP 130/80 | HR 75 | Temp 97.6°F | Ht 66.0 in | Wt 152.0 lb

## 2022-01-18 DIAGNOSIS — I10 Essential (primary) hypertension: Secondary | ICD-10-CM | POA: Diagnosis not present

## 2022-01-18 DIAGNOSIS — E039 Hypothyroidism, unspecified: Secondary | ICD-10-CM | POA: Diagnosis not present

## 2022-01-18 DIAGNOSIS — E119 Type 2 diabetes mellitus without complications: Secondary | ICD-10-CM

## 2022-01-18 DIAGNOSIS — E7800 Pure hypercholesterolemia, unspecified: Secondary | ICD-10-CM

## 2022-01-18 DIAGNOSIS — E2839 Other primary ovarian failure: Secondary | ICD-10-CM | POA: Diagnosis not present

## 2022-01-18 DIAGNOSIS — Z0000 Encounter for general adult medical examination without abnormal findings: Secondary | ICD-10-CM | POA: Diagnosis not present

## 2022-01-18 LAB — CBC WITH DIFFERENTIAL/PLATELET
Basophils Absolute: 0 10*3/uL (ref 0.0–0.1)
Basophils Relative: 0.6 % (ref 0.0–3.0)
Eosinophils Absolute: 0.1 10*3/uL (ref 0.0–0.7)
Eosinophils Relative: 1.1 % (ref 0.0–5.0)
HCT: 43.5 % (ref 36.0–46.0)
Hemoglobin: 14.4 g/dL (ref 12.0–15.0)
Lymphocytes Relative: 29.3 % (ref 12.0–46.0)
Lymphs Abs: 1.7 10*3/uL (ref 0.7–4.0)
MCHC: 33.1 g/dL (ref 30.0–36.0)
MCV: 91.8 fl (ref 78.0–100.0)
Monocytes Absolute: 0.5 10*3/uL (ref 0.1–1.0)
Monocytes Relative: 8.5 % (ref 3.0–12.0)
Neutro Abs: 3.5 10*3/uL (ref 1.4–7.7)
Neutrophils Relative %: 60.5 % (ref 43.0–77.0)
Platelets: 216 10*3/uL (ref 150.0–400.0)
RBC: 4.74 Mil/uL (ref 3.87–5.11)
RDW: 13.4 % (ref 11.5–15.5)
WBC: 5.8 10*3/uL (ref 4.0–10.5)

## 2022-01-18 LAB — COMPREHENSIVE METABOLIC PANEL
ALT: 18 U/L (ref 0–35)
AST: 19 U/L (ref 0–37)
Albumin: 4 g/dL (ref 3.5–5.2)
Alkaline Phosphatase: 72 U/L (ref 39–117)
BUN: 17 mg/dL (ref 6–23)
CO2: 35 mEq/L — ABNORMAL HIGH (ref 19–32)
Calcium: 9.4 mg/dL (ref 8.4–10.5)
Chloride: 101 mEq/L (ref 96–112)
Creatinine, Ser: 0.77 mg/dL (ref 0.40–1.20)
GFR: 70.2 mL/min (ref >60.00)
Glucose, Bld: 124 mg/dL — ABNORMAL HIGH (ref 70–99)
Potassium: 3.9 mEq/L (ref 3.5–5.1)
Sodium: 142 mEq/L (ref 135–145)
Total Bilirubin: 0.8 mg/dL (ref 0.2–1.2)
Total Protein: 6.4 g/dL (ref 6.0–8.3)

## 2022-01-18 LAB — HEMOGLOBIN A1C: Hgb A1c MFr Bld: 7.2 % — ABNORMAL HIGH (ref 4.6–6.5)

## 2022-01-18 LAB — LIPID PANEL
Cholesterol: 156 mg/dL (ref 0–200)
HDL: 68 mg/dL (ref >39.00)
LDL Cholesterol: 74 mg/dL (ref 0–99)
NonHDL: 87.91
Total CHOL/HDL Ratio: 2
Triglycerides: 70 mg/dL (ref 0.0–149.0)
VLDL: 14 mg/dL (ref 0.0–40.0)

## 2022-01-18 LAB — TSH: TSH: 0.51 u[IU]/mL (ref 0.35–5.50)

## 2022-01-18 MED ORDER — HYDROCHLOROTHIAZIDE 12.5 MG PO TABS: 12.5000 mg | ORAL_TABLET | Freq: Every day | ORAL | 3 refills | Status: DC

## 2022-01-18 NOTE — Progress Notes (Signed)
Office Note ?01/18/2022 ? ?CC:  ?Chief Complaint  ?Shannon Greene presents with  ? Annual Exam  ?  Pt is fasting  ? ? ?HPI:  ?Shannon Greene is a 88 y.o. female who is here for annual health maintenance exam and 48-monthfollow-up diet-controlled diabetes, hypertension, hypothyroidism, and hyperlipidemia. ?At her last visit 3 months ago everything was going well.  Hemoglobin A1c had improved to 6.5%. ? ?Hypoth: Takes T4 on empty stomach w/out any other meds. ?Synthroid 50 mcg 50 mcg qd except 1/2 tab T/R/sat ? 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 ? ?No home blood pressure or glucose monitoring data today. ? ?Past Medical History:  ?Diagnosis Date  ? Corneal dystrophies, hereditary   ? Diabetes mellitus 10/2010  ? A1c 6.7%   ? Diverticulosis   ? Dry eye syndrome   ? Hyperlipemia   ? Hypertension   ? Hypothyroidism   ? Lumbar spondylosis 05/2016  ? x-ray  ? Macular degeneration, bilateral   ? Osteoarthritis   ? Presbyopia   ? Transfusion history 1959  ? post partum  ? ? ?Past Surgical History:  ?Procedure Laterality Date  ? APPENDECTOMY  1959  ? CATARACT EXTRACTION, BILATERAL    ? Dr DBing Plume ? [place] 1959  ? COLECTOMY    ? 18 inches removed 2002-2003 for Diverticulitis  ? COLONOSCOPY    ? Tics,Dr SMorrie Sheldondeclines any further screening as of 12/2015.  ? DEXA  06/30/2016;10/13/19  ? 2017 T score 2.5.  Rpt 2020 NORMAL. Rpt 3 yrs  ? VAGINAL HYSTERECTOMY  1975  ? For Fibroids (no BSO)  ? ? ?Family History  ?Problem Relation Age of Onset  ? Heart attack Mother 716 ? Hypertension Mother   ? Tuberculosis Father   ? Diabetes Brother   ? Stroke Brother 40  ? Prostate cancer Brother   ? Breast cancer Neg Hx   ? ? ?Social History  ? ?Socioeconomic History  ? Marital status: Married  ?  Spouse name: Not on file  ? Number of children: Not on file  ? Years of education: Not on file  ?  Highest education level: Not on file  ?Occupational History  ? Not on file  ?Tobacco Use  ? Smoking status: Never  ? Smokeless tobacco: Never  ?Vaping Use  ? Vaping Use: Never used  ?Substance and Sexual Activity  ? Alcohol use: No  ? Drug use: No  ? Sexual activity: Not Currently  ?Other Topics Concern  ? Not on file  ?Social History Narrative  ? Married, 2 daughters.  ? Occupation: retired (Facilities manager.  ? Silver sneakers.  ? No T/A/Ds.  ? ?Social Determinants of Health  ? ?Financial Resource Strain: Low Risk   ? Difficulty of Paying Living Expenses: Not hard at all  ?Food Insecurity: No Food Insecurity  ? Worried About RCharity fundraiserin the Last Year: Never true  ? Ran Out of Food in the Last Year: Never true  ?Transportation Needs: No Transportation Needs  ? Lack of Transportation (Medical): No  ? Lack of Transportation (Non-Medical): No  ?Physical Activity: Inactive  ? Days of Exercise per Week: 0 days  ? Minutes of Exercise per Session: 0 min  ?Stress: No Stress Concern Present  ? Feeling of Stress : Not at all  ?Social Connections:  Socially Integrated  ? Frequency of Communication with Friends and Family: Three times a week  ? Frequency of Social Gatherings with Friends and Family: More than three times a week  ? Attends Religious Services: More than 4 times per year  ? Active Member of Clubs or Organizations: Yes  ? Attends Archivist Meetings: 1 to 4 times per year  ? Marital Status: Married  ?Intimate Partner Violence: Not At Risk  ? Fear of Current or Ex-Partner: No  ? Emotionally Abused: No  ? Physically Abused: No  ? Sexually Abused: No  ? ? ?Outpatient Medications Prior to Visit  ?Medication Sig Dispense Refill  ? atorvastatin (LIPITOR) 40 MG tablet Take 1 tablet (40 mg total) by mouth daily. 90 tablet 3  ? cycloSPORINE (RESTASIS) 0.05 % ophthalmic emulsion Place 1 drop into both eyes 2 (two) times daily.    ? levothyroxine (SYNTHROID) 50 MCG tablet TAKE 1 TABLET EVERY DAY   EXCEPT TAKE 1/2 TABLET ON TUESDAY, THURSDAY, AND SATURDAY 84 tablet 3  ? ramipril (ALTACE) 5 MG capsule Take 1 capsule (5 mg total) by mouth daily. 90 capsule 3  ? hydrochlorothiazide (HYDRODIURIL) 12.5 MG tablet TAKE 1 TABLET EVERY DAY 90 tablet 0  ? ?No facility-administered medications prior to visit.  ? ? ?Allergies  ?Allergen Reactions  ? Doxycycline Hyclate Diarrhea  ? Penicillins   ?  Rash ?Because of a history of documented adverse serious drug reaction;Medi Alert bracelet  is recommended  ? Tetanus Toxoid   ?  REACTION: Arm swelling-severe ?Because of a history of documented adverse serious drug reaction;Medi Alert bracelet  is recommended  ? Metformin And Related Nausea And Vomiting and Other (See Comments)  ?  Abdominal pain  ? ?ROS ?Review of Systems  ?Constitutional:  Negative for appetite change, chills, fatigue and fever.  ?HENT:  Negative for congestion, dental problem, ear pain and sore throat.   ?Eyes:  Negative for discharge, redness and visual disturbance.  ?Respiratory:  Negative for cough, chest tightness, shortness of breath and wheezing.   ?Cardiovascular:  Negative for chest pain, palpitations and leg swelling.  ?Gastrointestinal:  Negative for abdominal pain, blood in stool, diarrhea, nausea and vomiting.  ?Genitourinary:  Negative for difficulty urinating, dysuria, flank pain, frequency, hematuria and urgency.  ?Musculoskeletal:  Negative for arthralgias, back pain, joint swelling, myalgias and neck stiffness.  ?Skin:  Negative for pallor and rash.  ?Neurological:  Negative for dizziness, speech difficulty, weakness and headaches.  ?Hematological:  Negative for adenopathy. Does not bruise/bleed easily.  ?Psychiatric/Behavioral:  Negative for confusion and sleep disturbance. The Shannon Greene is not nervous/anxious.   ? ?PE; ?Vitals with BMI 01/18/2022 01/18/2022 10/19/2021  ?Height - '5\' 6"'$  '5\' 5"'$   ?Weight - 152 lbs 154 lbs 6 oz  ?BMI - 24.55 25.69  ?Systolic [phone]  ?Diastolic 80 82 62  ?Pulse  - 75 76  ? ?Exam chaperoned by Deveron Furlong, CMA. ? ?Gen: Alert, well appearing.  Shannon Greene is oriented to person, place, time, and situation. ?AFFECT: pleasant, lucid thought and speech. ?ENT: Ears: EACs clear, normal epithelium.  TMs with good light reflex and landmarks bilaterally.  Eyes: no injection, icteris, swelling, or exudate.  EOMI, PERRLA. ?Nose: no drainage or turbinate edema/swelling.  No injection or focal lesion.  Mouth: lips without lesion/swelling.  Oral mucosa pink and moist.  Dentition intact and without obvious caries or gingival swelling.  Oropharynx without erythema, exudate, or swelling.  ?Neck: supple/nontender.  No LAD, mass, or TM.  Carotid pulses 2+ bilaterally, without bruits. ?CV: RRR, no m/r/g.   ?LUNGS: CTA bilat, nonlabored resps, good aeration in all lung fields. ?ABD: soft, NT, ND, BS normal.  No hepatospenomegaly or mass.  No bruits. ?EXT: no clubbing, cyanosis, or edema.  ?Musculoskeletal: no joint swelling, erythema, warmth, or tenderness.  ROM of all joints intact. ?Skin - no sores or suspicious lesions or rashes or color changes ? ?Pertinent labs:  ?Lab Results  ?Component Value Date  ? TSH 0.33 (L) 07/20/2021  ? ?Lab Results  ?Component Value Date  ? WBC 5.5 01/19/2021  ? HGB 14.3 01/19/2021  ? HCT 42.3 01/19/2021  ? MCV 90.8 01/19/2021  ? PLT 199.0 01/19/2021  ? ?Lab Results  ?Component Value Date  ? CREATININE 0.78 07/20/2021  ? BUN 20 07/20/2021  ? NA 142 07/20/2021  ? K 4.0 07/20/2021  ? CL 101 07/20/2021  ? CO2 33 (H) 07/20/2021  ? ?Lab Results  ?Component Value Date  ? ALT 21 07/20/2021  ? AST 19 07/20/2021  ? ALKPHOS 67 07/20/2021  ? BILITOT 0.9 07/20/2021  ? ?Lab Results  ?Component Value Date  ? CHOL 150 07/20/2021  ? ?Lab Results  ?Component Value Date  ? HDL 59.10 07/20/2021  ? ?Lab Results  ?Component Value Date  ? [place] 73 07/20/2021  ? ?Lab Results  ?Component Value Date  ? TRIG 87.0 07/20/2021  ? ?Lab Results  ?Component Value Date  ? CHOLHDL 3 07/20/2021   ? ?Lab Results  ?Component Value Date  ? HGBA1C 6.5 (A) 10/19/2021  ? HGBA1C 6.5 10/19/2021  ? HGBA1C 6.5 (A) 10/19/2021  ? HGBA1C 6.5 10/19/2021  ? ?ASSESSMENT AND PLAN:  ? ?#1 diabetes, diet controlle

## 2022-01-18 NOTE — Patient Instructions (Signed)

## 2022-01-22 ENCOUNTER — Ambulatory Visit
Admission: RE | Admit: 2022-01-22 | Discharge: 2022-01-22 | Disposition: A | Discharge status: Home / Self Care | Payer: Medicare HMO | Source: Ambulatory Visit | Attending: Family Medicine | Admitting: Family Medicine

## 2022-01-22 ENCOUNTER — Other Ambulatory Visit: Payer: Self-pay

## 2022-01-22 DIAGNOSIS — M85832 Other specified disorders of bone density and structure, left forearm: Secondary | ICD-10-CM | POA: Diagnosis not present

## 2022-01-22 DIAGNOSIS — Z780 Asymptomatic menopausal state: Secondary | ICD-10-CM | POA: Diagnosis not present

## 2022-01-22 DIAGNOSIS — E2839 Other primary ovarian failure: Secondary | ICD-10-CM

## 2022-01-23 ENCOUNTER — Encounter: Payer: Self-pay | Admitting: Family Medicine

## 2022-01-29 ENCOUNTER — Ambulatory Visit
Admission: RE | Admit: 2022-01-29 | Discharge: 2022-01-29 | Disposition: A | Discharge status: Home / Self Care | Payer: Medicare HMO | Source: Ambulatory Visit | Attending: Family Medicine | Admitting: Family Medicine

## 2022-01-29 DIAGNOSIS — Z1231 Encounter for screening mammogram for malignant neoplasm of breast: Secondary | ICD-10-CM | POA: Diagnosis not present

## 2022-03-02 DIAGNOSIS — H524 Presbyopia: Secondary | ICD-10-CM | POA: Diagnosis not present

## 2022-03-29 ENCOUNTER — Encounter: Payer: Self-pay | Admitting: Family Medicine

## 2022-03-29 ENCOUNTER — Ambulatory Visit (INDEPENDENT_AMBULATORY_CARE_PROVIDER_SITE_OTHER): Payer: Medicare HMO | Admitting: Family Medicine

## 2022-03-29 VITALS — BP 125/76 | HR 75 | Temp 97.9°F | Ht 66.0 in | Wt 155.2 lb

## 2022-03-29 DIAGNOSIS — L821 Other seborrheic keratosis: Secondary | ICD-10-CM

## 2022-03-29 DIAGNOSIS — L989 Disorder of the skin and subcutaneous tissue, unspecified: Secondary | ICD-10-CM

## 2022-03-29 DIAGNOSIS — Z634 Disappearance and death of family member: Secondary | ICD-10-CM

## 2022-03-29 DIAGNOSIS — L820 Inflamed seborrheic keratosis: Secondary | ICD-10-CM | POA: Diagnosis not present

## 2022-03-29 NOTE — Progress Notes (Signed)
OFFICE VISIT  03/29/2022  CC:  Chief Complaint  Patient presents with   Grief   Patient is a 88 y.o. female who presents for grief response.  HPI: Shannon Greene's husband died a month ago. She has become aware that her daughter was diagnosed with bone cancer within the last month as well.  Appropriately sad, misses her husband that.  Initiates sleep well but wakes up in a couple hours and has trouble getting back to sleep. Appetite and food intake is good. She has supportive neighbors and family.  Of note, she has chronic low back pain and finds it very difficult to walk across a parking lot to go into a store/shop for groceries. Asks for handicap sticker application today.  Has a crusty growth on the front of her neck growing more lately.  Past Medical History:  Diagnosis Date   Corneal dystrophies, hereditary    Diabetes mellitus 10/2010   A1c 6.7%    Diverticulosis    Dry eye syndrome    Hyperlipemia    Hypertension    Hypothyroidism    Lumbar spondylosis 05/2016   x-ray   Macular degeneration, bilateral    Osteoarthritis    Presbyopia    Transfusion history 1959   post partum    Past Surgical History:  Procedure Laterality Date   APPENDECTOMY  1959   CATARACT EXTRACTION, BILATERAL     Dr Bing Plume   CESAREAN SECTION  [phone]; 1959   COLECTOMY     18 inches removed 2002-2003 for Diverticulitis   COLONOSCOPY     Tics,Dr Sam Empire--pt declines any further screening as of 12/2015.   DEXA  06/30/2016;10/13/19   2017 T score 2.5.  Rpt 2020 NORMAL.  01/2022 T score -1.2   VAGINAL HYSTERECTOMY  1975   For Fibroids (no BSO)    Outpatient Medications Prior to Visit  Medication Sig Dispense Refill   atorvastatin (LIPITOR) 40 MG tablet Take 1 tablet (40 mg total) by mouth daily. 90 tablet 3   cycloSPORINE (RESTASIS) 0.05 % ophthalmic emulsion Place 1 drop into both eyes 2 (two) times daily.     hydrochlorothiazide (HYDRODIURIL) 12.5 MG tablet Take 1 tablet (12.5 mg total) by  mouth daily. 90 tablet 3   levothyroxine (SYNTHROID) 50 MCG tablet TAKE 1 TABLET EVERY DAY  EXCEPT TAKE 1/2 TABLET ON TUESDAY, THURSDAY, AND SATURDAY 84 tablet 3   ramipril (ALTACE) 5 MG capsule Take 1 capsule (5 mg total) by mouth daily. 90 capsule 3   No facility-administered medications prior to visit.    Allergies  Allergen Reactions   Doxycycline Hyclate Diarrhea   Penicillins     Rash Because of a history of documented adverse serious drug reaction;Medi Alert bracelet  is recommended   Tetanus Toxoid     REACTION: Arm swelling-severe Because of a history of documented adverse serious drug reaction;Medi Alert bracelet  is recommended   Metformin And Related Nausea And Vomiting and Other (See Comments)    Abdominal pain    ROS As per HPI  PE:    03/29/2022   10:51 AM 01/18/2022    9:12 AM 01/18/2022    8:59 AM  Vitals with BMI  Height '5\' 6"'$   '5\' 6"'$   Weight 155 lbs 3 oz  152 lbs  BMI 37.10  62.69  Systolic 485 462 703  Diastolic 76 80 82  Pulse 75  75   Physical Exam  General: Alert and well-appearing. Tearful when talking at times.  Smiling at times.  Lucid thought and speech. Anterior neck area with 1 cm crusted brown pedunculated papule.  LABS:  Last CBC Lab Results  Component Value Date   WBC 5.8 01/18/2022   HGB 14.4 01/18/2022   HCT 43.5 01/18/2022   MCV 91.8 01/18/2022   RDW 13.4 01/18/2022   PLT 216.0 68/10/7516   Last metabolic panel Lab Results  Component Value Date   GLUCOSE 124 (H) 01/18/2022   NA 142 01/18/2022   K 3.9 01/18/2022   CL 101 01/18/2022   CO2 35 (H) 01/18/2022   BUN 17 01/18/2022   CREATININE 0.77 01/18/2022   CALCIUM 9.4 01/18/2022   PROT 6.4 01/18/2022   ALBUMIN 4.0 01/18/2022   BILITOT 0.8 01/18/2022   ALKPHOS 72 01/18/2022   AST 19 01/18/2022   ALT 18 01/18/2022   Last hemoglobin A1c Lab Results  Component Value Date   HGBA1C 7.2 (H) 01/18/2022   IMPRESSION AND PLAN:  #1 grief responses,  appropriate. Encouragement/emotional support given today. I told her to call or return if she feels like she needs any short-term medication assistance for sleep or anxiety. She declines any referral for grief counseling.  #2 neck skin lesion. Suspect seborrheic keratosis. I excised this today and sent to pathology.  Procedure: skin lesion removal.  Consent obtained.  Area prepped .  Sterile technique utilized to excise the lesion at its base.  The resulting skin wound was a punctate skin defect.  No closure needed.  No immediate complications.  Pt tolerated procedure well. Specimen sent to pathology.  Wound care instructions discussed.  An After Visit Summary was printed and given to the patient.  FOLLOW UP: No follow-ups on file.  Signed:  Crissie Sickles, MD           03/29/2022

## 2022-03-29 NOTE — Addendum Note (Signed)
Addended by: Deveron Furlong D on: 03/29/2022 02:07 PM   Modules accepted: Orders

## 2022-04-20 ENCOUNTER — Encounter: Payer: Self-pay | Admitting: Family Medicine

## 2022-04-20 ENCOUNTER — Ambulatory Visit (INDEPENDENT_AMBULATORY_CARE_PROVIDER_SITE_OTHER): Payer: Medicare HMO | Admitting: Family Medicine

## 2022-04-20 ENCOUNTER — Telehealth: Payer: Self-pay

## 2022-04-20 VITALS — BP 147/80 | HR 59 | Temp 97.8°F | Ht 66.0 in | Wt 156.6 lb

## 2022-04-20 DIAGNOSIS — E119 Type 2 diabetes mellitus without complications: Secondary | ICD-10-CM

## 2022-04-20 DIAGNOSIS — I10 Essential (primary) hypertension: Secondary | ICD-10-CM

## 2022-04-20 DIAGNOSIS — L821 Other seborrheic keratosis: Secondary | ICD-10-CM | POA: Diagnosis not present

## 2022-04-20 DIAGNOSIS — E7800 Pure hypercholesterolemia, unspecified: Secondary | ICD-10-CM

## 2022-04-20 LAB — POCT GLYCOSYLATED HEMOGLOBIN (HGB A1C)
HbA1c POC (<> result, manual entry): 7 % (ref 4.0–5.6)
HbA1c, POC (controlled diabetic range): 7 % (ref 0.0–7.0)
HbA1c, POC (prediabetic range): 7 % — AB (ref 5.7–6.4)
Hemoglobin A1C: 7 % — AB (ref 4.0–5.6)

## 2022-04-20 MED ORDER — ACCU-CHEK GUIDE VI STRP: ORAL_STRIP | 3 refills | Status: DC

## 2022-04-20 NOTE — Telephone Encounter (Signed)
Rx sent to pharmacy, pt was advised.

## 2022-04-20 NOTE — Addendum Note (Signed)
Addended by: Deveron Furlong D on: 04/20/2022 02:33 PM   Modules accepted: Orders

## 2022-04-20 NOTE — Telephone Encounter (Signed)
Please send prescription for the strips. Diagnosis type 2 diabetes without complication, without long-term use of insulin.  Instructions to check glucose once daily, 90-day supply, refill x3.

## 2022-04-20 NOTE — Telephone Encounter (Signed)
Pt called stating that she needs to have a Rx for accucheck guide strips.She forgot to mention in her appt today. Her and her husband were using the same meter but she needs to have strips sent to the pharmacy. Please advise

## 2022-04-20 NOTE — Progress Notes (Signed)
OFFICE VISIT  04/20/2022  CC:  Chief Complaint  Patient presents with   Diabetes   Hypertension   Hyperlipidemia    Pt is fasting    Patient is a 88 y.o. female who presents for 3 mo f/u diet-controlled diabetes, hypertension, and hyperlipidemia. A/P as of last visit: "#1 diabetes, diet controlled. Hemoglobin A1c today.  2.  Hypertension, well controlled on ramipril 5 mg a day and HCTZ 12.5 mg a day. Electrolytes and creatinine today.  3.  Hyperlipidemia, atorvastatin 40 mg a day. Last LDL was 73 about 6 months ago Lipid panel and hepatic panel today.  4.  Hypothyroidism. TSH today.  5. Health maintenance exam: Reviewed age and gender appropriate health maintenance issues (prudent diet, regular exercise, health risks of tobacco and excessive alcohol, use of seatbelts, fire alarms in home, use of sunscreen).  Also reviewed age and gender appropriate health screening as well as vaccine recommendations. Vaccines: All up-to-date. Labs: Health panel, hemoglobin A1c. Cervical ca screening: No further screening needed. Breast ca screening: Due for mammogram and this has been ordered. Colon ca screening: no further screening indicated d/t age. Osteoporosis screening: Most recent DEXA 2020 showed normal bone density.  Ordered repeat today."  INTERIM HX: All labs normal last check except hemoglobin A1c rose from 6.5% to 7.2%. Still having normal grieving for [facility]. Diet has not been as good because she often has TV dinners lately. Activity somewhat limited due to some musculoskeletal aches and pains. She has a brown crusty skin lesion on the right temple that her daughter is concerned about.  Home blood pressures are consistently more mobile.  ROS as above, plus-->  recent mild low back strain sustained while vacuuming recently.  This is getting better.   No fevers, no CP, no SOB, no wheezing, no cough, no dizziness, no HAs, no rashes, no melena/hematochezia.  No polyuria or  polydipsia.   No focal weakness, paresthesias, or tremors.  No acute vision or hearing abnormalities.  No dysuria or unusual/new urinary urgency or frequency.  No recent changes in lower legs. No n/v/d or abd pain.  No palpitations.    Past Medical History:  Diagnosis Date   Corneal dystrophies, hereditary    Diabetes mellitus 10/2010   A1c 6.7%    Diverticulosis    Dry eye syndrome    Hyperlipemia    Hypertension    Hypothyroidism    Lumbar spondylosis 05/2016   x-ray   Macular degeneration, bilateral    Osteoarthritis    Presbyopia    Transfusion history 1959   post partum    Past Surgical History:  Procedure Laterality Date   APPENDECTOMY  1959   CATARACT EXTRACTION, BILATERAL     Dr Bing Plume   CESAREAN SECTION  [phone]; 1959   COLECTOMY     18 inches removed 2002-2003 for Diverticulitis   COLONOSCOPY     Tics,Dr Sam Hurricane--pt declines any further screening as of 12/2015.   DEXA  06/30/2016;10/13/19   2017 T score 2.5.  Rpt 2020 NORMAL.  01/2022 T score -1.2   VAGINAL HYSTERECTOMY  1975   For Fibroids (no BSO)    Outpatient Medications Prior to Visit  Medication Sig Dispense Refill   atorvastatin (LIPITOR) 40 MG tablet Take 1 tablet (40 mg total) by mouth daily. 90 tablet 3   cycloSPORINE (RESTASIS) 0.05 % ophthalmic emulsion Place 1 drop into both eyes 2 (two) times daily.     hydrochlorothiazide (HYDRODIURIL) 12.5 MG tablet Take 1 tablet (12.5 mg  total) by mouth daily. 90 tablet 3   levothyroxine (SYNTHROID) 50 MCG tablet TAKE 1 TABLET EVERY DAY  EXCEPT TAKE 1/2 TABLET ON TUESDAY, THURSDAY, AND SATURDAY 84 tablet 3   ramipril (ALTACE) 5 MG capsule Take 1 capsule (5 mg total) by mouth daily. 90 capsule 3   No facility-administered medications prior to visit.    Allergies  Allergen Reactions   Doxycycline Hyclate Diarrhea   Penicillins     Rash Because of a history of documented adverse serious drug reaction;Medi Alert bracelet  is recommended   Tetanus Toxoid      REACTION: Arm swelling-severe Because of a history of documented adverse serious drug reaction;Medi Alert bracelet  is recommended   Metformin And Related Nausea And Vomiting and Other (See Comments)    Abdominal pain    ROS As per HPI  PE:    04/20/2022    9:40 AM 03/29/2022   10:51 AM 01/18/2022    9:12 AM  Vitals with BMI  Height '5\' 6"'$  '5\' 6"'$    Weight 156 lbs 10 oz 155 lbs 3 oz   BMI 86.57 84.69   Systolic 629 528 413  Diastolic 80 76 80  Pulse 59 75    Physical Exam  Gen: Alert, well appearing.  Patient is oriented to person, place, time, and situation. AFFECT: pleasant, lucid thought and speech. Right temple has a nickel sized brown crusted plaque consistent with a seborrheic keratosis lesion No further exam today.   LABS:  Last CBC Lab Results  Component Value Date   WBC 5.8 01/18/2022   HGB 14.4 01/18/2022   HCT 43.5 01/18/2022   MCV 91.8 01/18/2022   RDW 13.4 01/18/2022   PLT 216.0 24/40/1027   Last metabolic panel Lab Results  Component Value Date   GLUCOSE 124 (H) 01/18/2022   NA 142 01/18/2022   K 3.9 01/18/2022   CL 101 01/18/2022   CO2 35 (H) 01/18/2022   BUN 17 01/18/2022   CREATININE 0.77 01/18/2022   CALCIUM 9.4 01/18/2022   PROT 6.4 01/18/2022   ALBUMIN 4.0 01/18/2022   BILITOT 0.8 01/18/2022   ALKPHOS 72 01/18/2022   AST 19 01/18/2022   ALT 18 01/18/2022   Last lipids Lab Results  Component Value Date   CHOL 156 01/18/2022   HDL 68.00 01/18/2022   LDLCALC 74 01/18/2022   TRIG 70.0 01/18/2022   CHOLHDL 2 01/18/2022   Last hemoglobin A1c Lab Results  Component Value Date   HGBA1C 7.0 (A) 04/20/2022   HGBA1C 7.0 04/20/2022   HGBA1C 7.0 (A) 04/20/2022   HGBA1C 7.0 04/20/2022   Last thyroid functions Lab Results  Component Value Date   TSH 0.51 01/18/2022   IMPRESSION AND PLAN:  #1 hypertension, well controlled on HCTZ 12.5 mg a day and Altace 5 mg a day.  #2 Diet-controlled DM, stable.  She will try to make some dietary  improvements. A1c today 7%.  #3 hypercholesterolemia: Doing well on atorvastatin 40 mg a day. LDL 74 about 3 months ago. Plan recheck lipid and hepatic panel 3 months.  #4 seborrheic keratosis, right temple.  Reassured.   Family is worried about this lesion and she is considering having it removed.  She will return if she decides she wants this done  renal function, electrolytes, hepatic panel all repeatedly normal.  We will repeat these in 3 months.  An After Visit Summary was printed and given to the patient.  FOLLOW UP: No follow-ups on file. Next cpe 01/2023  Signed:  Crissie Sickles, MD           04/20/2022

## 2022-05-30 DIAGNOSIS — E1165 Type 2 diabetes mellitus with hyperglycemia: Secondary | ICD-10-CM | POA: Diagnosis not present

## 2022-05-30 DIAGNOSIS — H18593 Other hereditary corneal dystrophies, bilateral: Secondary | ICD-10-CM | POA: Diagnosis not present

## 2022-05-30 DIAGNOSIS — H353132 Nonexudative age-related macular degeneration, bilateral, intermediate dry stage: Secondary | ICD-10-CM | POA: Diagnosis not present

## 2022-05-30 DIAGNOSIS — H04123 Dry eye syndrome of bilateral lacrimal glands: Secondary | ICD-10-CM | POA: Diagnosis not present

## 2022-05-30 LAB — HM DIABETES EYE EXAM

## 2022-06-06 ENCOUNTER — Telehealth: Payer: Self-pay | Admitting: Family Medicine

## 2022-06-06 ENCOUNTER — Ambulatory Visit (INDEPENDENT_AMBULATORY_CARE_PROVIDER_SITE_OTHER)
Admission: RE | Admit: 2022-06-06 | Discharge: 2022-06-06 | Disposition: A | Discharge status: Home / Self Care | Payer: Medicare HMO | Source: Ambulatory Visit | Attending: Family Medicine | Admitting: Family Medicine

## 2022-06-06 ENCOUNTER — Ambulatory Visit (INDEPENDENT_AMBULATORY_CARE_PROVIDER_SITE_OTHER): Payer: Medicare HMO | Admitting: Family Medicine

## 2022-06-06 ENCOUNTER — Encounter: Payer: Self-pay | Admitting: Family Medicine

## 2022-06-06 VITALS — BP 124/76 | HR 67 | Temp 98.0°F | Ht 66.0 in | Wt 161.4 lb

## 2022-06-06 DIAGNOSIS — M545 Low back pain, unspecified: Secondary | ICD-10-CM

## 2022-06-06 DIAGNOSIS — M533 Sacrococcygeal disorders, not elsewhere classified: Secondary | ICD-10-CM

## 2022-06-06 MED ORDER — MELOXICAM 7.5 MG PO TABS: 7.5000 mg | ORAL_TABLET | Freq: Every day | ORAL | 0 refills | Status: DC

## 2022-06-06 MED ORDER — METRONIDAZOLE 0.75 % EX CREA: TOPICAL_CREAM | Freq: Two times a day (BID) | CUTANEOUS | 1 refills | Status: DC

## 2022-06-06 NOTE — Addendum Note (Signed)
Addended by: Tammi Sou on: 06/06/2022 04:50 PM   Modules accepted: Orders

## 2022-06-06 NOTE — Telephone Encounter (Signed)
Patient called to inquire about her facial cream being sent to the pharmacy. Provider has sent cream and patient has been made aware.

## 2022-06-06 NOTE — Progress Notes (Addendum)
OFFICE VISIT  06/06/2022  CC:  Chief Complaint  Patient presents with   Back Pain    Pt states pain worsened over time; takes tylenol as needed. Declines pain meds    Patient is a 88 y.o. female who presents for back pain.  HPI: Pain in the low back that has been building over the last 6 to 8 months.  Has become significantly worse in the last 2 to 3 weeks. She points to the SI joint regions bilaterally.  The pain does not radiate.  She has no paresthesias.  No leg weakness.  No acute injury preceding the worsening of her pain. Over the last year she has spent all her time taking care of her ill husband who was in a wheelchair.  This required lots of bending over and lifting. She has been taking Tylenol and this has helped a little bit.  She also notes onset of a pinkish rash over her nose and cheeks last week or so. It does not hurt or itch.  ROS as above, plus--> no fevers, no CP, no SOB, no wheezing, no cough, no dizziness, no HAs, no melena/hematochezia.  No polyuria or polydipsia.  No myalgias or arthralgias.  No focal weakness, paresthesias, or tremors.  No acute vision or hearing abnormalities.  No dysuria or unusual/new urinary urgency or frequency.  No recent changes in lower legs. No n/v/d or abd pain.  No palpitations.     Past Medical History:  Diagnosis Date   Corneal dystrophies, hereditary    Diabetes mellitus 10/2010   A1c 6.7%    Diverticulosis    Dry eye syndrome    Hyperlipemia    Hypertension    Hypothyroidism    Lumbar spondylosis 05/2016   x-ray   Macular degeneration, bilateral    Osteoarthritis    Presbyopia    Transfusion history 1959   post partum    Past Surgical History:  Procedure Laterality Date   APPENDECTOMY  1959   CATARACT EXTRACTION, BILATERAL     Dr Bing Plume   CESAREAN SECTION  [phone]; 1959   COLECTOMY     18 inches removed 2002-2003 for Diverticulitis   COLONOSCOPY     Tics,Dr Sam Caballo--pt declines any further screening as of  12/2015.   DEXA  06/30/2016;10/13/19   2017 T score 2.5.  Rpt 2020 NORMAL.  01/2022 T score -1.2   VAGINAL HYSTERECTOMY  1975   For Fibroids (no BSO)    Outpatient Medications Prior to Visit  Medication Sig Dispense Refill   atorvastatin (LIPITOR) 40 MG tablet Take 1 tablet (40 mg total) by mouth daily. 90 tablet 3   cycloSPORINE (RESTASIS) 0.05 % ophthalmic emulsion Place 1 drop into both eyes 2 (two) times daily.     glucose blood (ACCU-CHEK GUIDE) test strip Use to check blood sugar once daily. 100 each 3   hydrochlorothiazide (HYDRODIURIL) 12.5 MG tablet Take 1 tablet (12.5 mg total) by mouth daily. 90 tablet 3   levothyroxine (SYNTHROID) 50 MCG tablet TAKE 1 TABLET EVERY DAY  EXCEPT TAKE 1/2 TABLET ON TUESDAY, THURSDAY, AND SATURDAY 84 tablet 3   ramipril (ALTACE) 5 MG capsule Take 1 capsule (5 mg total) by mouth daily. 90 capsule 3   No facility-administered medications prior to visit.    Allergies  Allergen Reactions   Doxycycline Hyclate Diarrhea   Penicillins     Rash Because of a history of documented adverse serious drug reaction;Medi Alert bracelet  is recommended   Tetanus Toxoid  REACTION: Arm swelling-severe Because of a history of documented adverse serious drug reaction;Medi Alert bracelet  is recommended   Metformin And Related Nausea And Vomiting and Other (See Comments)    Abdominal pain    ROS As per HPI  PE:    06/06/2022    1:24 PM 04/20/2022    9:40 AM 03/29/2022   10:51 AM  Vitals with BMI  Height '5\' 6"'$  '5\' 6"'$  '5\' 6"'$   Weight 161 lbs 6 oz 156 lbs 10 oz 155 lbs 3 oz  BMI 26.06 16.10 96.04  Systolic 540 981 191  Diastolic 76 80 76  Pulse 67 59 75     Physical Exam  Exam chaperoned by Deveron Furlong, CMA. Gen: Alert, well appearing.  Patient is oriented to person, place, time, and situation. AFFECT: pleasant, lucid thought and speech. Hurts quite a bit to stand.  She has focal tenderness to palpation over the SI joints bilaterally.  No midline  spinal tenderness.  She has fully intact forward flexion of the L-spine.  Near full extension of L-spine but this is stiff and painful. Negative straight leg bilaterally.  Lower extremity strength 5 out of 5 proximally distally bilaterally. Pelvic distraction test negative. FABER on the right leg elicits left SI joint pain.  FADIR does not elicit any pain. Hip range of motion fully intact and without pain.  No tenderness over the greater trochanters or gluteal region. SKIN: Splotchy pinkish papular rash on the bridge of the nose as well as medial aspects of both cheeks. No greasy or flaky component. No pustules or vesicles.  LABS:  Last CBC Lab Results  Component Value Date   WBC 5.8 01/18/2022   HGB 14.4 01/18/2022   HCT 43.5 01/18/2022   MCV 91.8 01/18/2022   RDW 13.4 01/18/2022   PLT 216.0 47/82/9562   Last metabolic panel Lab Results  Component Value Date   GLUCOSE 124 (H) 01/18/2022   NA 142 01/18/2022   K 3.9 01/18/2022   CL 101 01/18/2022   CO2 35 (H) 01/18/2022   BUN 17 01/18/2022   CREATININE 0.77 01/18/2022   CALCIUM 9.4 01/18/2022   PROT 6.4 01/18/2022   ALBUMIN 4.0 01/18/2022   BILITOT 0.8 01/18/2022   ALKPHOS 72 01/18/2022   AST 19 01/18/2022   ALT 18 01/18/2022   Lab Results  Component Value Date   HGBA1C 7.0 (A) 04/20/2022   HGBA1C 7.0 04/20/2022   HGBA1C 7.0 (A) 04/20/2022   HGBA1C 7.0 04/20/2022   IMPRESSION AND PLAN:  1) Mechanical low back pain, with a prominent component of SI joint pain. Due to her age we will check lumbosacral plain films. Meloxicam 7.5 mg a day x10 days. Physical therapy referral--[facility]. Consider diagnostic/therapeutic SI joint injection if not significantly improving with physical therapy in 4 to 6 weeks.  #2 facial rash, most consistent with acne rosacea.  Differential diagnosis: Cutaneous lupus. Prescribed metronidazole 0.75% cream twice daily.  An After Visit Summary was printed and given to the  patient.  FOLLOW UP: Return in about 4 weeks (around 07/04/2022) for back pain.  Signed:  Crissie Sickles, MD           06/06/2022

## 2022-06-08 ENCOUNTER — Ambulatory Visit (INDEPENDENT_AMBULATORY_CARE_PROVIDER_SITE_OTHER): Payer: Medicare HMO | Admitting: Physical Therapy

## 2022-06-08 DIAGNOSIS — M5459 Other low back pain: Secondary | ICD-10-CM | POA: Diagnosis not present

## 2022-06-08 NOTE — Therapy (Signed)
OUTPATIENT PHYSICAL THERAPY THORACOLUMBAR EVALUATION   Patient Name: Shannon Greene MRN: [id number] DOB:05-29-1934, 88 y.o., female Today's Date: 06/08/2022   PT End of Session - 06/15/22 2130     Visit Number 1    Number of Visits 12    Date for PT Re-Evaluation 07/20/22    Authorization Type [employer]    PT Start Time 1233    PT Stop Time 1314    PT Time Calculation (min) 41 min    Activity Tolerance Patient tolerated treatment well    Behavior During Therapy [facility] for tasks assessed/performed             Past Medical History:  Diagnosis Date   Corneal dystrophies, hereditary    Diabetes mellitus 10/2010   A1c 6.7%    Diverticulosis    Dry eye syndrome    Hyperlipemia    Hypertension    Hypothyroidism    Lumbar spondylosis 05/2016   x-ray   Macular degeneration, bilateral    Osteoarthritis    Presbyopia    Transfusion history 1959   post partum   Past Surgical History:  Procedure Laterality Date   APPENDECTOMY  1959   CATARACT EXTRACTION, BILATERAL     Dr Bing Plume   CESAREAN SECTION  [phone]; 1959   COLECTOMY     18 inches removed 2002-2003 for Diverticulitis   COLONOSCOPY     Tics,Dr Sam Clermont--pt declines any further screening as of 12/2015.   DEXA  06/30/2016;10/13/19   2017 T score 2.5.  Rpt 2020 NORMAL.  01/2022 T score -1.2   VAGINAL HYSTERECTOMY  1975   For Fibroids (no BSO)   Patient Active Problem List   Diagnosis Date Noted   Overweight (BMI 25.0-29.9) 07/14/2019   Diabetes mellitus without complication ([place]) 16/60/6301   Increased pressure in the eye 01/23/2014   Diverticulosis 11/13/2011   Diabetes type 2, controlled ([place]) 10/17/2010   POSTMENOPAUSAL SYNDROME 08/31/2009   GOITER 10/26/2008   Hypothyroidism 10/26/2008   Hyperlipidemia 10/26/2008   Essential hypertension 10/26/2008   OSTEOARTHRITIS 10/26/2008    PCP: Ricardo Jericho  REFERRING PROVIDER: Phlip McGowen  REFERRING DIAG: Low back pain  Rationale for Evaluation and Treatment  Rehabilitation  THERAPY DIAG:  Other low back pain  ONSET DATE:   SUBJECTIVE:                                                                                                                                                                                           SUBJECTIVE STATEMENT: Pt states increased pain with Initial standing/transfers , sit to stand. Pain in R side of low back/SI. Pain bil, but  more in R. Sitting she has no pain, and after walking a few steps and with activity  she has no pain.  No stairs, lives alone.    PERTINENT HISTORY:    PAIN:  Are you having pain? Yes: NPRS scale: 5/10 Pain location: bil Low back , SI  Pain description: Dull  Aggravating factors: sit to stand transfer  Relieving factors: sitting, rest    PRECAUTIONS: None  WEIGHT BEARING RESTRICTIONS No  FALLS:  Has patient fallen in last 6 months? No   PLOF: Independent  PATIENT GOALS   decreased pain in back.    OBJECTIVE:   DIAGNOSTIC FINDINGS:    COGNITION:  Overall cognitive status: Within functional limits for tasks assessed      POSTURE:   PALPATION: Soreness in R SI joint.   Special tests; Neg SIJ compression.    LUMBAR ROM:   Active  A/PROM  eval  Flexion Mild limitation  Extension Mod limitation  Right lateral flexion Mod limitation/pain  Left lateral flexion WFL  Right rotation   Left rotation    (Blank rows = not tested)  LOWER EXTREMITY ROM:    Hip ROM: WNL   LOWER EXTREMITY MMT:    Hip strength: 4/5,  Knees: 4/5,     TODAY'S TREATMENT  See below for HEP Manual: long leg distraction for back and hip bil; (non painful cavitation in R SIJ with long leg distraction)    PATIENT EDUCATION:  Education details: PT POC, Exam findings, HEP Person educated: Patient Education method: Explanation, Demonstration, Tactile cues, Verbal cues, and Handouts Education comprehension: verbalized understanding, returned demonstration, verbal cues required,  tactile cues required, and needs further education   HOME EXERCISE PROGRAM: Access Code: [email or web address] URL: [email or web address] Date: 06/08/2022 Prepared by: Lyndee Hensen  Exercises - Supine Posterior Pelvic Tilt  - 2 x daily - 1-2 sets - 10 reps - 5 hold - Hooklying Single Knee to Chest Stretch  - 2 x daily - 3 reps - 30 hold - Supine Hip Adduction Isometric with Ball  - 1 x daily - 1-2 sets - 10 reps    ASSESSMENT:  CLINICAL IMPRESSION: Pt presents with primary complaint of increased pain in bil R>L SI region. She has most pain with transition movements of going from sit to stand, and initial standing. She has mild pain to palpate in R SIJ region. She does not appear to have postural deficits with transfers, but will benefit from education on mechanics to improve pain. She will benefit from hip and core strengthening and education on HEP. Pt to benefit from skilled PT to improve deficits and pain.    OBJECTIVE IMPAIRMENTS decreased activity tolerance, decreased mobility, decreased strength, impaired flexibility, and pain.   ACTIVITY LIMITATIONS lifting, bending, standing, stairs, transfers, and locomotion level  PARTICIPATION LIMITATIONS: cleaning, laundry, and community activity  PERSONAL FACTORS  none  are also affecting patient's functional outcome.   REHAB POTENTIAL: Good  CLINICAL DECISION MAKING: Stable/uncomplicated  EVALUATION COMPLEXITY: Low   GOALS: Goals reviewed with patient? Yes  SHORT TERM GOALS: Target date: 06/22/2022  Pt to be independent with initial HEP  Goal status: INITIAL      LONG TERM GOALS: Target date: 07/20/2022    Pt to be independent with final HEP  Goal status: INITIAL  2.  Pt to demo ability for sit to stand with optimal mechanics for Low back and LEs.   Goal status: INITIAL  3.  Pt to report decreased pain in SI/low back region  with transfers and initial standing.   Goal status: INITIAL    PLAN: PT  FREQUENCY: 2x/week  PT DURATION: 6 weeks  PLANNED INTERVENTIONS: Therapeutic exercises, Therapeutic activity, Neuromuscular re-education, Balance training, Gait training, Patient/Family education, Self Care, Joint mobilization, Joint manipulation, Stair training, DME instructions, Dry Needling, Spinal manipulation, Spinal mobilization, Cryotherapy, Moist heat, Taping, Traction, Ultrasound, Ionotophoresis '4mg'$ /ml Dexamethasone, and Manual therapy.  PLAN FOR NEXT SESSION:   Lyndee Hensen, PT, DPT 9:32 PM  06/15/22

## 2022-06-12 ENCOUNTER — Encounter: Payer: Medicare HMO | Admitting: Physical Therapy

## 2022-06-14 ENCOUNTER — Ambulatory Visit (INDEPENDENT_AMBULATORY_CARE_PROVIDER_SITE_OTHER): Payer: Medicare HMO | Admitting: Physical Therapy

## 2022-06-14 DIAGNOSIS — M5459 Other low back pain: Secondary | ICD-10-CM | POA: Diagnosis not present

## 2022-06-14 NOTE — Therapy (Deleted)
OUTPATIENT PHYSICAL THERAPY THORACOLUMBAR EVALUATION   Patient Name: Shannon Greene MRN: [id number] DOB:Nov 30, 1933, 88 y.o., female Today's Date: 06/14/2022    Past Medical History:  Diagnosis Date   Corneal dystrophies, hereditary    Diabetes mellitus 10/2010   A1c 6.7%    Diverticulosis    Dry eye syndrome    Hyperlipemia    Hypertension    Hypothyroidism    Lumbar spondylosis 05/2016   x-ray   Macular degeneration, bilateral    Osteoarthritis    Presbyopia    Transfusion history 1959   post partum   Past Surgical History:  Procedure Laterality Date   APPENDECTOMY  1959   CATARACT EXTRACTION, BILATERAL     Dr Bing Plume   CESAREAN SECTION  [phone]; 1959   COLECTOMY     18 inches removed 2002-2003 for Diverticulitis   COLONOSCOPY     Tics,Dr Sam [place]--pt declines any further screening as of 12/2015.   DEXA  06/30/2016;10/13/19   2017 T score 2.5.  Rpt 2020 NORMAL.  01/2022 T score -1.2   VAGINAL HYSTERECTOMY  1975   For Fibroids (no BSO)   Patient Active Problem List   Diagnosis Date Noted   Overweight (BMI 25.0-29.9) 07/14/2019   Diabetes mellitus without complication ([place]) 84/16/6063   Increased pressure in the eye 01/23/2014   Diverticulosis 11/13/2011   Diabetes type 2, controlled ([place]) 10/17/2010   POSTMENOPAUSAL SYNDROME 08/31/2009   GOITER 10/26/2008   Hypothyroidism 10/26/2008   Hyperlipidemia 10/26/2008   Essential hypertension 10/26/2008   OSTEOARTHRITIS 10/26/2008    PCP: ***  REFERRING PROVIDER: ***  REFERRING DIAG: ***  Rationale for Evaluation and Treatment {HABREHAB:27488}  THERAPY DIAG:  No diagnosis found.  ONSET DATE: ***  SUBJECTIVE:                                                                                                                                                                                           SUBJECTIVE STATEMENT: Initial standing/transfers sit to stand.  No stairs, lives alone.    PERTINENT HISTORY:     PAIN:  Are you having pain? Yes: NPRS scale: ***/10 Pain location: bil Low back , SI  Pain description: Dull  Aggravating factors: sit to stand transfer  Relieving factors: sitting, rest    PRECAUTIONS: None  WEIGHT BEARING RESTRICTIONS No  FALLS:  Has patient fallen in last 6 months? No  LIVING ENVIRONMENT: Lives with: {OPRC lives with:25569::"lives with their family"} Lives in: {Lives in:25570} Stairs: {opstairs:27293} Has following equipment at home: {Assistive devices:23999}   PLOF: Independent  PATIENT GOALS   decreased pain in back.    OBJECTIVE:  DIAGNOSTIC FINDINGS:    COGNITION:  Overall cognitive status: Within functional limits for tasks assessed      POSTURE:   PALPATION: Soreness in R SI joint.   Special tests; Neg SIJ compression.    LUMBAR ROM:   Active  A/PROM  eval  Flexion Mild limitation  Extension Mod limitation  Right lateral flexion Mod limitation/pain  Left lateral flexion WFL  Right rotation   Left rotation    (Blank rows = not tested)  LOWER EXTREMITY ROM:    Hip ROM: WNL   LOWER EXTREMITY MMT:    Hip strength: 4/5,  Knees: 4/5,     GAIT: Distance walked: *** Assistive device utilized: {Assistive devices:23999} Level of assistance: {Levels of assistance:24026} Comments: ***   TODAY'S TREATMENT  See below for HEP Manual: long leg distraction for back and hip bil;     PATIENT EDUCATION:  Education details: PT POC, Exam findings, HEP Person educated: Patient Education method: Explanation, Demonstration, Tactile cues, Verbal cues, and Handouts Education comprehension: verbalized understanding, returned demonstration, verbal cues required, tactile cues required, and needs further education   HOME EXERCISE PROGRAM: Access Code: [email or web address] URL: [email or web address] Date: 06/08/2022 Prepared by: Lyndee Hensen  Exercises - Supine Posterior Pelvic Tilt  - 2 x daily - 1-2 sets - 10 reps - 5  hold - Hooklying Single Knee to Chest Stretch  - 2 x daily - 3 reps - 30 hold - Supine Hip Adduction Isometric with Ball  - 1 x daily - 1-2 sets - 10 reps    ASSESSMENT:  CLINICAL IMPRESSION: Patient is a *** y.o. *** who was seen today for physical therapy evaluation and treatment for ***.    OBJECTIVE IMPAIRMENTS {opptimpairments:25111}.   ACTIVITY LIMITATIONS {activitylimitations:27494}  PARTICIPATION LIMITATIONS: {participationrestrictions:25113}  PERSONAL FACTORS {Personal factors:25162} are also affecting patient's functional outcome.   REHAB POTENTIAL: {rehabpotential:25112}  CLINICAL DECISION MAKING: {clinical decision making:25114}  EVALUATION COMPLEXITY: {Evaluation complexity:25115}   GOALS: Goals reviewed with patient? Yes  SHORT TERM GOALS: Target date: 06/22/2022  *** Baseline: Goal status: {GOALSTATUS:25110}  2.  *** Baseline:  Goal status: {GOALSTATUS:25110}  3.  *** Baseline:  Goal status: {GOALSTATUS:25110}  4.  *** Baseline:  Goal status: {GOALSTATUS:25110}  5.  *** Baseline:  Goal status: {GOALSTATUS:25110}  6.  *** Baseline:  Goal status: {GOALSTATUS:25110}    LONG TERM GOALS: Target date: 07/20/2022    *** Baseline:  Goal status: {GOALSTATUS:25110}  2.  *** Baseline:  Goal status: {GOALSTATUS:25110}  3.  *** Baseline:  Goal status: {GOALSTATUS:25110}  4.  *** Baseline:  Goal status: {GOALSTATUS:25110}  5.  *** Baseline:  Goal status: {GOALSTATUS:25110}  6.  *** Baseline:  Goal status: {GOALSTATUS:25110}   PLAN: PT FREQUENCY: {rehab frequency:25116}  PT DURATION: {rehab duration:25117}  PLANNED INTERVENTIONS: {rehab planned interventions:25118::"Therapeutic exercises","Therapeutic activity","Neuromuscular re-education","Balance training","Gait training","Patient/Family education","Self Care","Joint mobilization"}.  PLAN FOR NEXT SESSION: ***   Lyndee Hensen, PT 06/14/2022, 2:31 PM

## 2022-06-15 ENCOUNTER — Encounter: Payer: Self-pay | Admitting: Physical Therapy

## 2022-06-15 NOTE — Therapy (Signed)
OUTPATIENT PHYSICAL THERAPY THORACOLUMBAR Treatment    Patient Name: Shannon Greene MRN: [id number] DOB:[birth year], 88 y.o., female Today's Date: 06/14/2022   PT End of Session - 06/15/22 2142     Visit Number 2    Number of Visits 12    Date for PT Re-Evaluation 07/20/22    Authorization Type [employer]    PT Start Time 1434    PT Stop Time 6378    PT Time Calculation (min) 41 min    Activity Tolerance Patient tolerated treatment well    Behavior During Therapy [facility] for tasks assessed/performed             Past Medical History:  Diagnosis Date   Corneal dystrophies, hereditary    Diabetes mellitus 10/2010   A1c 6.7%    Diverticulosis    Dry eye syndrome    Hyperlipemia    Hypertension    Hypothyroidism    Lumbar spondylosis 05/2016   x-ray   Macular degeneration, bilateral    Osteoarthritis    Presbyopia    Transfusion history 1959   post partum   Past Surgical History:  Procedure Laterality Date   APPENDECTOMY  1959   CATARACT EXTRACTION, BILATERAL     Dr Bing Plume   CESAREAN SECTION  [phone]; 1959   COLECTOMY     18 inches removed 2002-2003 for Diverticulitis   COLONOSCOPY     Tics,Dr Sam [place]--pt declines any further screening as of 12/2015.   DEXA  06/30/2016;10/13/19   2017 T score 2.5.  Rpt 2020 NORMAL.  01/2022 T score -1.2   VAGINAL HYSTERECTOMY  1975   For Fibroids (no BSO)   Patient Active Problem List   Diagnosis Date Noted   Overweight (BMI 25.0-29.9) 07/14/2019   Diabetes mellitus without complication ([place]) 02/77/4128   Increased pressure in the eye 01/23/2014   Diverticulosis 11/13/2011   Diabetes type 2, controlled ([place]) 10/17/2010   POSTMENOPAUSAL SYNDROME 08/31/2009   GOITER 10/26/2008   Hypothyroidism 10/26/2008   Hyperlipidemia 10/26/2008   Essential hypertension 10/26/2008   OSTEOARTHRITIS 10/26/2008    PCP: Ricardo Jericho  REFERRING PROVIDER: Phlip McGowen  REFERRING DIAG: Low back pain  Rationale for Evaluation and Treatment  Rehabilitation  THERAPY DIAG:  Other low back pain  ONSET DATE:   SUBJECTIVE:                                                                                                                                                                                           SUBJECTIVE STATEMENT: PT states shes no worse, but no better. Has tried exercises.   Eval:Pt states increased pain with Initial standing/transfers ,  sit to stand. Pain in R side of low back/SI. Pain bil, but more in R. Sitting she has no pain, and after walking a few steps and with activity  she has no pain.  No stairs, lives alone.    PERTINENT HISTORY:    PAIN:  Are you having pain? Yes: NPRS scale: 5/10 Pain location: bil Low back , SI  Pain description: Dull  Aggravating factors: sit to stand transfer  Relieving factors: sitting, rest    PRECAUTIONS: None  WEIGHT BEARING RESTRICTIONS No  FALLS:  Has patient fallen in last 6 months? No   PLOF: Independent  PATIENT GOALS   decreased pain in back.    OBJECTIVE:   DIAGNOSTIC FINDINGS:    COGNITION:  Overall cognitive status: Within functional limits for tasks assessed      POSTURE:   PALPATION: Soreness in R SI joint.   Special tests; Neg SIJ compression.    LUMBAR ROM:   Active  A/PROM  eval  Flexion Mild limitation  Extension Mod limitation  Right lateral flexion Mod limitation/pain  Left lateral flexion WFL  Right rotation   Left rotation    (Blank rows = not tested)  LOWER EXTREMITY ROM:    Hip ROM: WNL   LOWER EXTREMITY MMT:    Hip strength: 4/5,  Knees: 4/5,     TODAY'S TREATMENT  06/14/22 Therapeutic Exercise: Aerobic: Supine: Pelvic tilts x 15; Hip add Iso with ball x 20; Hip abd GTB alternating x 20; Bridging x10;  Seated: sit to stand x 10, education on optimal mechanics for back.  Standing: Stretches: Sktc 30 sec x 3bil; Piriformis fig 4, seated and supine 30 sec x 3 ea bil;  Neuromuscular Re-education: Manual  Therapy: long leg distraction/lumbar bil;  R post hip mobs (pain free cavitation in SIJ);    PATIENT EDUCATION:  Education details: updated and reviwed HEP Person educated: Patient Education method: Explanation, Demonstration, Tactile cues, Verbal cues, and Handouts Education comprehension: verbalized understanding, returned demonstration, verbal cues required, tactile cues required, and needs further education   HOME EXERCISE PROGRAM: Access Code: [email or web address]    ASSESSMENT:  CLINICAL IMPRESSION: Pt with good tolerance for activity today. No increased pain with activities. She continues to have soreness upon initial standing. Will benefit from continued mobility for lumbar and sacrum, as well as strengthening.    OBJECTIVE IMPAIRMENTS decreased activity tolerance, decreased mobility, decreased strength, impaired flexibility, and pain.   ACTIVITY LIMITATIONS lifting, bending, standing, stairs, transfers, and locomotion level  PARTICIPATION LIMITATIONS: cleaning, laundry, and community activity  PERSONAL FACTORS  none  are also affecting patient's functional outcome.   REHAB POTENTIAL: Good  CLINICAL DECISION MAKING: Stable/uncomplicated  EVALUATION COMPLEXITY: Low   GOALS: Goals reviewed with patient? Yes  SHORT TERM GOALS: Target date: 06/22/2022  Pt to be independent with initial HEP  Goal status: INITIAL      LONG TERM GOALS: Target date: 07/20/2022    Pt to be independent with final HEP  Goal status: INITIAL  2.  Pt to demo ability for sit to stand with optimal mechanics for Low back and LEs.   Goal status: INITIAL  3.  Pt to report decreased pain in SI/low back region with transfers and initial standing.   Goal status: INITIAL    PLAN: PT FREQUENCY: 2x/week  PT DURATION: 6 weeks  PLANNED INTERVENTIONS: Therapeutic exercises, Therapeutic activity, Neuromuscular re-education, Balance training, Gait training, Patient/Family education, Self Care,  Joint mobilization, Joint manipulation, Stair training, DME instructions, Dry  Needling, Spinal manipulation, Spinal mobilization, Cryotherapy, Moist heat, Taping, Traction, Ultrasound, Ionotophoresis '4mg'$ /ml Dexamethasone, and Manual therapy.  PLAN FOR NEXT SESSION:   Lyndee Hensen, PT, DPT 9:44 PM  06/15/22

## 2022-06-19 ENCOUNTER — Encounter: Payer: Self-pay | Admitting: Physical Therapy

## 2022-06-19 ENCOUNTER — Ambulatory Visit (INDEPENDENT_AMBULATORY_CARE_PROVIDER_SITE_OTHER): Payer: Medicare HMO | Admitting: Physical Therapy

## 2022-06-19 DIAGNOSIS — M5459 Other low back pain: Secondary | ICD-10-CM | POA: Diagnosis not present

## 2022-06-19 NOTE — Therapy (Signed)
OUTPATIENT PHYSICAL THERAPY THORACOLUMBAR Treatment    Patient Name: Shannon Greene MRN: [id number] DOB:Oct 08, 1934, 88 y.o., female Today's Date: 06/19/2022   PT End of Session - 06/19/22 1310     Visit Number 3    Number of Visits 12    Date for PT Re-Evaluation 07/20/22    Authorization Type [employer]    PT Start Time 1696    PT Stop Time 7893    PT Time Calculation (min) 39 min    Activity Tolerance Patient tolerated treatment well    Behavior During Therapy [facility] for tasks assessed/performed              Past Medical History:  Diagnosis Date   Corneal dystrophies, hereditary    Diabetes mellitus 10/2010   A1c 6.7%    Diverticulosis    Dry eye syndrome    Hyperlipemia    Hypertension    Hypothyroidism    Lumbar spondylosis 05/2016   x-ray   Macular degeneration, bilateral    Osteoarthritis    Presbyopia    Transfusion history 1959   post partum   Past Surgical History:  Procedure Laterality Date   APPENDECTOMY  1959   CATARACT EXTRACTION, BILATERAL     Dr Bing Plume   CESAREAN SECTION  [phone]; 1959   COLECTOMY     18 inches removed 2002-2003 for Diverticulitis   COLONOSCOPY     Tics,Dr Sam [place]--pt declines any further screening as of 12/2015.   DEXA  06/30/2016;10/13/19   2017 T score 2.5.  Rpt 2020 NORMAL.  01/2022 T score -1.2   VAGINAL HYSTERECTOMY  1975   For Fibroids (no BSO)   Patient Active Problem List   Diagnosis Date Noted   Overweight (BMI 25.0-29.9) 07/14/2019   Diabetes mellitus without complication ([place]) 75/08/2584   Increased pressure in the eye 01/23/2014   Diverticulosis 11/13/2011   Diabetes type 2, controlled ([place]) 10/17/2010   POSTMENOPAUSAL SYNDROME 08/31/2009   GOITER 10/26/2008   Hypothyroidism 10/26/2008   Hyperlipidemia 10/26/2008   Essential hypertension 10/26/2008   OSTEOARTHRITIS 10/26/2008    PCP: Ricardo Jericho  REFERRING PROVIDER: Phlip McGowen  REFERRING DIAG: Low back pain  Rationale for Evaluation and Treatment  Rehabilitation  THERAPY DIAG:  Other low back pain  ONSET DATE:   SUBJECTIVE:                                                                                                                                                                                           SUBJECTIVE STATEMENT: Pt states increased pain last Friday and over the weekend. Pain a bit better today, but still increased from  normal level.   Eval:Pt states increased pain with Initial standing/transfers , sit to stand. Pain in R side of low back/SI. Pain bil, but more in R. Sitting she has no pain, and after walking a few steps and with activity  she has no pain.  No stairs, lives alone.    PERTINENT HISTORY:    PAIN:  Are you having pain? Yes: NPRS scale: 5/10 Pain location: bil Low back , SI  Pain description: Dull  Aggravating factors: sit to stand transfer  Relieving factors: sitting, rest    PRECAUTIONS: None  WEIGHT BEARING RESTRICTIONS No  FALLS:  Has patient fallen in last 6 months? No   PLOF: Independent  PATIENT GOALS   decreased pain in back.    OBJECTIVE:   DIAGNOSTIC FINDINGS:    COGNITION:  Overall cognitive status: Within functional limits for tasks assessed      POSTURE:   PALPATION: Soreness in R SI joint.   Special tests; Neg SIJ compression.    LUMBAR ROM:   Active  A/PROM  eval  Flexion Mild limitation  Extension Mod limitation  Right lateral flexion Mod limitation/pain  Left lateral flexion WFL  Right rotation   Left rotation    (Blank rows = not tested)  LOWER EXTREMITY ROM:    Hip ROM: WNL   LOWER EXTREMITY MMT:    Hip strength: 4/5,  Knees: 4/5,     TODAY'S TREATMENT  06/19/22: Therapeutic Exercise: Aerobic: Supine: Pelvic tilts x 15; Hip add Iso with ball x 20; Hip abd GTB x 20;  SLR 2 x 10 bil;  Seated: sit to stand x 5  Standing:  Stretches: Sktc 30 sec x 3bil; Piriformis fig 4, seated  30 sec x 3 ea bil;  Seated HSS 30 sec x 3 bil;  Seated  fwd flexion stretch for back 30 sec x 3;  Neuromuscular Re-education: Manual Therapy: long leg distraction/lumbar bil;     06/14/22 Therapeutic Exercise: Aerobic: Supine: Pelvic tilts x 15; Hip add Iso with ball x 20; Hip abd GTB alternating x 20; Bridging x10;  Seated: sit to stand x 10, education on optimal mechanics for back.  Standing: Stretches: Sktc 30 sec x 3bil; Piriformis fig 4, seated and supine 30 sec x 3 ea bil;  Neuromuscular Re-education: Manual Therapy: long leg distraction/lumbar bil;  R post hip mobs (pain free cavitation in SIJ);    PATIENT EDUCATION:  Education details: updated and reviwed HEP Person educated: Patient Education method: Explanation, Demonstration, Tactile cues, Verbal cues, and Handouts Education comprehension: verbalized understanding, returned demonstration, verbal cues required, tactile cues required, and needs further education   HOME EXERCISE PROGRAM: Access Code: [email or web address]    ASSESSMENT:  CLINICAL IMPRESSION: Pt with soreness with sit to stand transition. Discussed continuing light stretching with HEP, and not over doing activity at home. Pt did well with ther ex today, no increased pain with activity, except standing to walk. Improved pain after walking a few steps.    OBJECTIVE IMPAIRMENTS decreased activity tolerance, decreased mobility, decreased strength, impaired flexibility, and pain.   ACTIVITY LIMITATIONS lifting, bending, standing, stairs, transfers, and locomotion level  PARTICIPATION LIMITATIONS: cleaning, laundry, and community activity  PERSONAL FACTORS  none  are also affecting patient's functional outcome.   REHAB POTENTIAL: Good  CLINICAL DECISION MAKING: Stable/uncomplicated  EVALUATION COMPLEXITY: Low   GOALS: Goals reviewed with patient? Yes  SHORT TERM GOALS: Target date: 06/22/2022  Pt to be independent with initial HEP  Goal status: INITIAL  LONG TERM GOALS: Target date: 07/20/2022    Pt  to be independent with final HEP  Goal status: INITIAL  2.  Pt to demo ability for sit to stand with optimal mechanics for Low back and LEs.   Goal status: INITIAL  3.  Pt to report decreased pain in SI/low back region with transfers and initial standing.   Goal status: INITIAL    PLAN: PT FREQUENCY: 2x/week  PT DURATION: 6 weeks  PLANNED INTERVENTIONS: Therapeutic exercises, Therapeutic activity, Neuromuscular re-education, Balance training, Gait training, Patient/Family education, Self Care, Joint mobilization, Joint manipulation, Stair training, DME instructions, Dry Needling, Spinal manipulation, Spinal mobilization, Cryotherapy, Moist heat, Taping, Traction, Ultrasound, Ionotophoresis '4mg'$ /ml Dexamethasone, and Manual therapy.  PLAN FOR NEXT SESSION:   Lyndee Hensen, PT, DPT 1:46 PM  06/19/22

## 2022-06-21 ENCOUNTER — Encounter: Payer: Self-pay | Admitting: Physical Therapy

## 2022-06-21 ENCOUNTER — Ambulatory Visit: Payer: Medicare HMO | Admitting: Physical Therapy

## 2022-06-21 DIAGNOSIS — M5459 Other low back pain: Secondary | ICD-10-CM | POA: Diagnosis not present

## 2022-06-21 NOTE — Therapy (Signed)
OUTPATIENT PHYSICAL THERAPY THORACOLUMBAR Treatment    Patient Name: Shannon Greene MRN: [id number] DOB:07/08/1934, 88 y.o., female Today's Date: 06/21/2022   PT End of Session - 06/21/22 1304     Visit Number 4    Number of Visits 12    Date for PT Re-Evaluation 07/20/22    Authorization Type [employer]    PT Start Time 7628    PT Stop Time 3151    PT Time Calculation (min) 38 min    Activity Tolerance Patient tolerated treatment well    Behavior During Therapy [facility] for tasks assessed/performed              Past Medical History:  Diagnosis Date   Corneal dystrophies, hereditary    Diabetes mellitus 10/2010   A1c 6.7%    Diverticulosis    Dry eye syndrome    Hyperlipemia    Hypertension    Hypothyroidism    Lumbar spondylosis 05/2016   x-ray   Macular degeneration, bilateral    Osteoarthritis    Presbyopia    Transfusion history 1959   post partum   Past Surgical History:  Procedure Laterality Date   APPENDECTOMY  1959   CATARACT EXTRACTION, BILATERAL     Dr Bing Plume   CESAREAN SECTION  [phone]; 1959   COLECTOMY     18 inches removed 2002-2003 for Diverticulitis   COLONOSCOPY     Tics,Dr Sam [place]--pt declines any further screening as of 12/2015.   DEXA  06/30/2016;10/13/19   2017 T score 2.5.  Rpt 2020 NORMAL.  01/2022 T score -1.2   VAGINAL HYSTERECTOMY  1975   For Fibroids (no BSO)   Patient Active Problem List   Diagnosis Date Noted   Overweight (BMI 25.0-29.9) 07/14/2019   Diabetes mellitus without complication ([place]) 07/37/1062   Increased pressure in the eye 01/23/2014   Diverticulosis 11/13/2011   Diabetes type 2, controlled ([place]) 10/17/2010   POSTMENOPAUSAL SYNDROME 08/31/2009   GOITER 10/26/2008   Hypothyroidism 10/26/2008   Hyperlipidemia 10/26/2008   Essential hypertension 10/26/2008   OSTEOARTHRITIS 10/26/2008    PCP: Ricardo Jericho  REFERRING PROVIDER: Phlip McGowen  REFERRING DIAG: Low back pain  Rationale for Evaluation and Treatment  Rehabilitation  THERAPY DIAG:  Other low back pain  ONSET DATE:   SUBJECTIVE:                                                                                                                                                                                           SUBJECTIVE STATEMENT: Pt with less pain today than earlier this week. Still has mild soreness with initial standing, but much better.  Eval:Pt states increased pain with Initial standing/transfers , sit to stand. Pain in R side of low back/SI. Pain bil, but more in R. Sitting she has no pain, and after walking a few steps and with activity  she has no pain.  No stairs, lives alone.    PERTINENT HISTORY:    PAIN:  Are you having pain? Yes: NPRS scale: 5/10 Pain location: bil Low back , SI  Pain description: Dull  Aggravating factors: sit to stand transfer  Relieving factors: sitting, rest    PRECAUTIONS: None  WEIGHT BEARING RESTRICTIONS No  FALLS:  Has patient fallen in last 6 months? No   PLOF: Independent  PATIENT GOALS   decreased pain in back.    OBJECTIVE:   DIAGNOSTIC FINDINGS:    COGNITION:  Overall cognitive status: Within functional limits for tasks assessed      POSTURE:   PALPATION: Soreness in R SI joint.   Special tests; Neg SIJ compression.    LUMBAR ROM:   Active  A/PROM  eval  Flexion Mild limitation  Extension Mod limitation  Right lateral flexion Mod limitation/pain  Left lateral flexion WFL  Right rotation   Left rotation    (Blank rows = not tested)  LOWER EXTREMITY ROM:    Hip ROM: WNL   LOWER EXTREMITY MMT:    Hip strength: 4/5,  Knees: 4/5,     TODAY'S TREATMENT   06/21/22: Therapeutic Exercise: Aerobic: Supine: Pelvic tilts x 15; Hip add Iso with ball x 20; Hip abd GTB x 20;  Supine march GTB with TA x 15;  SLR 2 x 10 bil;  Seated: sit to stand x 10, cueing for mechanics.   Standing: Hip abd 2x10 bil;  Stretches: Sktc 30 sec x 3bil; Piriformis fig  4, supine 30 sec x 3 ea bil;  LTR x 10; Seated HSS 30 sec x 3 bil;  Seated fwd flexion stretch for back 30 sec x 3;  Neuromuscular Re-education: Manual Therapy: long leg distraction/lumbar bil, gentle,       06/19/22: Therapeutic Exercise: Aerobic: Supine: Pelvic tilts x 15; Hip add Iso with ball x 20; Hip abd GTB x 20;  SLR 2 x 10 bil;  Seated: sit to stand x 5  Standing:  Stretches: Sktc 30 sec x 3bil; Piriformis fig 4, seated  30 sec x 3 ea bil;  Seated HSS 30 sec x 3 bil;  Seated fwd flexion stretch for back 30 sec x 3;  Neuromuscular Re-education: Manual Therapy: long leg distraction/lumbar bil;     06/14/22 Therapeutic Exercise: Aerobic: Supine: Pelvic tilts x 15; Hip add Iso with ball x 20; Hip abd GTB alternating x 20; Bridging x10;  Seated: sit to stand x 10, education on optimal mechanics for back.  Standing: Stretches: Sktc 30 sec x 3bil; Piriformis fig 4, seated and supine 30 sec x 3 ea bil;  Neuromuscular Re-education: Manual Therapy: long leg distraction/lumbar bil;  R post hip mobs (pain free cavitation in SIJ);    PATIENT EDUCATION:  Education details: updated HEP Person educated: Patient Education method: Explanation, Demonstration, Tactile cues, Verbal cues, and Handouts Education comprehension: verbalized understanding, returned demonstration, verbal cues required, tactile cues required, and needs further education   HOME EXERCISE PROGRAM: Access Code: [email or web address]    ASSESSMENT:  CLINICAL IMPRESSION: Pt with much less pain today. Good tolerance for activity today. Education and practice for optimal mechanics with bend, squat for IADLs. Has had audible crepitus in SI region each visit she  has been here, likely from instability in this region. Will benefit from continued strength and stabilization.     OBJECTIVE IMPAIRMENTS decreased activity tolerance, decreased mobility, decreased strength, impaired flexibility, and pain.   ACTIVITY LIMITATIONS lifting,  bending, standing, stairs, transfers, and locomotion level  PARTICIPATION LIMITATIONS: cleaning, laundry, and community activity  PERSONAL FACTORS  none  are also affecting patient's functional outcome.   REHAB POTENTIAL: Good  CLINICAL DECISION MAKING: Stable/uncomplicated  EVALUATION COMPLEXITY: Low   GOALS: Goals reviewed with patient? Yes  SHORT TERM GOALS: Target date: 06/22/2022  Pt to be independent with initial HEP  Goal status: INITIAL      LONG TERM GOALS: Target date: 07/20/2022    Pt to be independent with final HEP  Goal status: INITIAL  2.  Pt to demo ability for sit to stand with optimal mechanics for Low back and LEs.   Goal status: INITIAL  3.  Pt to report decreased pain in SI/low back region with transfers and initial standing.   Goal status: INITIAL    PLAN: PT FREQUENCY: 2x/week  PT DURATION: 6 weeks  PLANNED INTERVENTIONS: Therapeutic exercises, Therapeutic activity, Neuromuscular re-education, Balance training, Gait training, Patient/Family education, Self Care, Joint mobilization, Joint manipulation, Stair training, DME instructions, Dry Needling, Spinal manipulation, Spinal mobilization, Cryotherapy, Moist heat, Taping, Traction, Ultrasound, Ionotophoresis '4mg'$ /ml Dexamethasone, and Manual therapy.  PLAN FOR NEXT SESSION:   Lyndee Hensen, PT, DPT 1:39 PM  06/21/22

## 2022-06-27 ENCOUNTER — Ambulatory Visit (INDEPENDENT_AMBULATORY_CARE_PROVIDER_SITE_OTHER): Payer: Medicare HMO | Admitting: Physical Therapy

## 2022-06-27 ENCOUNTER — Encounter: Payer: Self-pay | Admitting: Physical Therapy

## 2022-06-27 DIAGNOSIS — M5459 Other low back pain: Secondary | ICD-10-CM

## 2022-06-27 NOTE — Therapy (Signed)
OUTPATIENT PHYSICAL THERAPY THORACOLUMBAR Treatment    Patient Name: Shannon Greene MRN: [id number] DOB:1934-09-27, 88 y.o., female Today's Date: 06/27/2022   PT End of Session - 06/27/22 0902     Visit Number 5    Number of Visits 12    Date for PT Re-Evaluation 07/20/22    Authorization Type [employer]    PT Start Time 0853    PT Stop Time 0931    PT Time Calculation (min) 38 min    Activity Tolerance Patient tolerated treatment well    Behavior During Therapy [facility] for tasks assessed/performed               Past Medical History:  Diagnosis Date   Corneal dystrophies, hereditary    Diabetes mellitus 10/2010   A1c 6.7%    Diverticulosis    Dry eye syndrome    Hyperlipemia    Hypertension    Hypothyroidism    Lumbar spondylosis 05/2016   x-ray   Macular degeneration, bilateral    Osteoarthritis    Presbyopia    Transfusion history 1959   post partum   Past Surgical History:  Procedure Laterality Date   APPENDECTOMY  1959   CATARACT EXTRACTION, BILATERAL     Dr Bing Plume   CESAREAN SECTION  [phone]; 1959   COLECTOMY     18 inches removed 2002-2003 for Diverticulitis   COLONOSCOPY     Tics,Dr Sam [place]--pt declines any further screening as of 12/2015.   DEXA  06/30/2016;10/13/19   2017 T score 2.5.  Rpt 2020 NORMAL.  01/2022 T score -1.2   VAGINAL HYSTERECTOMY  1975   For Fibroids (no BSO)   Patient Active Problem List   Diagnosis Date Noted   Overweight (BMI 25.0-29.9) 07/14/2019   Diabetes mellitus without complication ([place]) 33/82/5053   Increased pressure in the eye 01/23/2014   Diverticulosis 11/13/2011   Diabetes type 2, controlled ([place]) 10/17/2010   POSTMENOPAUSAL SYNDROME 08/31/2009   GOITER 10/26/2008   Hypothyroidism 10/26/2008   Hyperlipidemia 10/26/2008   Essential hypertension 10/26/2008   OSTEOARTHRITIS 10/26/2008    PCP: Ricardo Jericho  REFERRING PROVIDER: Phlip McGowen  REFERRING DIAG: Low back pain  Rationale for Evaluation and  Treatment Rehabilitation  THERAPY DIAG:  Other low back pain  ONSET DATE:   SUBJECTIVE:                                                                                                                                                                                           SUBJECTIVE STATEMENT: Pt states vertigo yesterday. Took meclizine, and is better today.  Still having pain in SI joint. Has  f/u with MD on 8/30.  Eval:Pt states increased pain with Initial standing/transfers , sit to stand. Pain in R side of low back/SI. Pain bil, but more in R. Sitting she has no pain, and after walking a few steps and with activity  she has no pain.  No stairs, lives alone.    PERTINENT HISTORY:    PAIN:  Are you having pain? Yes: NPRS scale: 5/10 Pain location: bil Low back , SI  Pain description: Dull  Aggravating factors: sit to stand transfer  Relieving factors: sitting, rest    PRECAUTIONS: None  WEIGHT BEARING RESTRICTIONS No  FALLS:  Has patient fallen in last 6 months? No   PLOF: Independent  PATIENT GOALS   decreased pain in back.    OBJECTIVE:   DIAGNOSTIC FINDINGS:    COGNITION:  Overall cognitive status: Within functional limits for tasks assessed      POSTURE:   PALPATION: Soreness in R SI joint.   Special tests; Neg SIJ compression.    LUMBAR ROM:   Active  A/PROM  eval  Flexion Mild limitation  Extension Mod limitation  Right lateral flexion Mod limitation/pain  Left lateral flexion WFL  Right rotation   Left rotation    (Blank rows = not tested)  LOWER EXTREMITY ROM:    Hip ROM: WNL   LOWER EXTREMITY MMT:    Hip strength: 4/5,  Knees: 4/5,    TODAY'S TREATMENT   06/27/22 Therapeutic Exercise: Aerobic: Supine: Pelvic tilts x 15; Hip add Iso with ball x 20; Hip abd GTB x 20;  Supine march GTB with TA x 15;  SLR 2 x 10 bil;  Seated: sit to stand x 10, cueing for mechanics.   Standing: Hip abd 2x10 bil; slow march x 20;  Stretches:  Sktc 30 sec x 3bil; Piriformis fig 4, supine 30 sec x 3 ea bil;  LTR x 10; Seated HSS 30 sec x 3 bil;   Neuromuscular Re-education: Manual Therapy:       06/21/22: Therapeutic Exercise: Aerobic: Supine: Pelvic tilts x 15; Hip add Iso with ball x 20; Hip abd GTB x 20;  Supine march GTB with TA x 15;  SLR 2 x 10 bil;  Seated: sit to stand x 10, cueing for mechanics.   Standing: Hip abd 2x10 bil;  Stretches: Sktc 30 sec x 3bil; Piriformis fig 4, supine 30 sec x 3 ea bil;  LTR x 10; Seated HSS 30 sec x 3 bil;  Seated fwd flexion stretch for back 30 sec x 3;  Neuromuscular Re-education: Manual Therapy: long leg distraction/lumbar bil, gentle,       06/19/22: Therapeutic Exercise: Aerobic: Supine: Pelvic tilts x 15; Hip add Iso with ball x 20; Hip abd GTB x 20;  SLR 2 x 10 bil;  Seated: sit to stand x 5  Standing:  Stretches: Sktc 30 sec x 3bil; Piriformis fig 4, seated  30 sec x 3 ea bil;  Seated HSS 30 sec x 3 bil;  Seated fwd flexion stretch for back 30 sec x 3;  Neuromuscular Re-education: Manual Therapy: long leg distraction/lumbar bil;     06/14/22 Therapeutic Exercise: Aerobic: Supine: Pelvic tilts x 15; Hip add Iso with ball x 20; Hip abd GTB alternating x 20; Bridging x10;  Seated: sit to stand x 10, education on optimal mechanics for back.  Standing: Stretches: Sktc 30 sec x 3bil; Piriformis fig 4, seated and supine 30 sec x 3 ea bil;  Neuromuscular Re-education: Manual  Therapy: long leg distraction/lumbar bil;  R post hip mobs (pain free cavitation in SIJ);    PATIENT EDUCATION:  Education details: updated HEP Person educated: Patient Education method: Explanation, Demonstration, Tactile cues, Verbal cues, and Handouts Education comprehension: verbalized understanding, returned demonstration, verbal cues required, tactile cues required, and needs further education   HOME EXERCISE PROGRAM: Access Code: [email or web address]    ASSESSMENT:  CLINICAL IMPRESSION: Pt with mild  soreness today. Most pain with initial standing after sitting for a period of time. She has minimal pain with exercises today. She does have soreness with palpation of R SI and low lumbar spine. Will do more manual at next visit, If pt comfortable to lay on stomach, not comfortable today to try, due to recent vertigo yesterday.    OBJECTIVE IMPAIRMENTS decreased activity tolerance, decreased mobility, decreased strength, impaired flexibility, and pain.   ACTIVITY LIMITATIONS lifting, bending, standing, stairs, transfers, and locomotion level  PARTICIPATION LIMITATIONS: cleaning, laundry, and community activity  PERSONAL FACTORS  none  are also affecting patient's functional outcome.   REHAB POTENTIAL: Good  CLINICAL DECISION MAKING: Stable/uncomplicated  EVALUATION COMPLEXITY: Low   GOALS: Goals reviewed with patient? Yes  SHORT TERM GOALS: Target date: 06/22/2022  Pt to be independent with initial HEP  Goal status: INITIAL      LONG TERM GOALS: Target date: 07/20/2022    Pt to be independent with final HEP  Goal status: INITIAL  2.  Pt to demo ability for sit to stand with optimal mechanics for Low back and LEs.   Goal status: INITIAL  3.  Pt to report decreased pain in SI/low back region with transfers and initial standing.   Goal status: INITIAL    PLAN: PT FREQUENCY: 2x/week  PT DURATION: 6 weeks  PLANNED INTERVENTIONS: Therapeutic exercises, Therapeutic activity, Neuromuscular re-education, Balance training, Gait training, Patient/Family education, Self Care, Joint mobilization, Joint manipulation, Stair training, DME instructions, Dry Needling, Spinal manipulation, Spinal mobilization, Cryotherapy, Moist heat, Taping, Traction, Ultrasound, Ionotophoresis '4mg'$ /ml Dexamethasone, and Manual therapy.  PLAN FOR NEXT SESSION:   Lyndee Hensen, PT, DPT 10:56 AM  06/27/22

## 2022-07-04 ENCOUNTER — Ambulatory Visit (INDEPENDENT_AMBULATORY_CARE_PROVIDER_SITE_OTHER): Payer: Medicare HMO | Admitting: Physical Therapy

## 2022-07-04 ENCOUNTER — Encounter: Payer: Self-pay | Admitting: Family Medicine

## 2022-07-04 ENCOUNTER — Ambulatory Visit (INDEPENDENT_AMBULATORY_CARE_PROVIDER_SITE_OTHER): Payer: Medicare HMO | Admitting: Family Medicine

## 2022-07-04 ENCOUNTER — Encounter: Payer: Self-pay | Admitting: Physical Therapy

## 2022-07-04 VITALS — BP 126/74 | HR 78 | Temp 97.4°F | Ht 66.0 in | Wt 163.2 lb

## 2022-07-04 DIAGNOSIS — M5459 Other low back pain: Secondary | ICD-10-CM

## 2022-07-04 NOTE — Therapy (Addendum)
OUTPATIENT PHYSICAL THERAPY THORACOLUMBAR Treatment    Patient Name: Shannon Greene MRN: [id number] DOB:Sep 26, 1934, 88 y.o., female Today's Date: 07/04/2022   PT End of Session - 07/04/22 1124     Visit Number 6    Number of Visits 12    Date for PT Re-Evaluation 07/20/22    Authorization Type [employer]    PT Start Time 1105    PT Stop Time 1143    PT Time Calculation (min) 38 min    Activity Tolerance Patient tolerated treatment well    Behavior During Therapy [facility] for tasks assessed/performed                Past Medical History:  Diagnosis Date   Corneal dystrophies, hereditary    Diabetes mellitus 10/2010   A1c 6.7%    Diverticulosis    Dry eye syndrome    Hyperlipemia    Hypertension    Hypothyroidism    Lumbar spondylosis 05/2016   x-ray   Macular degeneration, bilateral    Osteoarthritis    Presbyopia    Transfusion history 1959   post partum   Past Surgical History:  Procedure Laterality Date   APPENDECTOMY  1959   CATARACT EXTRACTION, BILATERAL     Dr Bing Plume   CESAREAN SECTION  [phone]; 1959   COLECTOMY     18 inches removed 2002-2003 for Diverticulitis   COLONOSCOPY     Tics,Dr Sam [place]--pt declines any further screening as of 12/2015.   DEXA  06/30/2016;10/13/19   2017 T score 2.5.  Rpt 2020 NORMAL.  01/2022 T score -1.2   VAGINAL HYSTERECTOMY  1975   For Fibroids (no BSO)   Patient Active Problem List   Diagnosis Date Noted   Overweight (BMI 25.0-29.9) 07/14/2019   Diabetes mellitus without complication ([place]) 62/26/3335   Increased pressure in the eye 01/23/2014   Diverticulosis 11/13/2011   Diabetes type 2, controlled ([place]) 10/17/2010   POSTMENOPAUSAL SYNDROME 08/31/2009   GOITER 10/26/2008   Hypothyroidism 10/26/2008   Hyperlipidemia 10/26/2008   Essential hypertension 10/26/2008   OSTEOARTHRITIS 10/26/2008    PCP: Ricardo Jericho  REFERRING PROVIDER: Phlip McGowen  REFERRING DIAG: Low back pain  Rationale for Evaluation and  Treatment Rehabilitation  THERAPY DIAG:  Other low back pain  ONSET DATE:   SUBJECTIVE:                                                                                                                                                                                           SUBJECTIVE STATEMENT: Pt states improving pain in SI/lumbar region. Still has soreness, but 3/10 vs significant pain a few  weeks ago.   Eval:Pt states increased pain with Initial standing/transfers , sit to stand. Pain in R side of low back/SI. Pain bil, but more in R. Sitting she has no pain, and after walking a few steps and with activity  she has no pain.  No stairs, lives alone.    PERTINENT HISTORY:    PAIN:  Are you having pain? Yes: NPRS scale: 3-4/10 Pain location: bil Low back , SI  Pain description: Dull  Aggravating factors: sit to stand transfer  Relieving factors: sitting, rest    PRECAUTIONS: None  WEIGHT BEARING RESTRICTIONS No  FALLS:  Has patient fallen in last 6 months? No   PLOF: Independent  PATIENT GOALS   decreased pain in back.    OBJECTIVE:   DIAGNOSTIC FINDINGS:    COGNITION:  Overall cognitive status: Within functional limits for tasks assessed      POSTURE:   PALPATION: Soreness in R SI joint.   Special tests; Neg SIJ compression.    LUMBAR ROM:   Active  A/PROM  eval  Flexion Mild limitation  Extension Mod limitation  Right lateral flexion Mod limitation/pain  Left lateral flexion WFL  Right rotation   Left rotation    (Blank rows = not tested)  LOWER EXTREMITY ROM:    Hip ROM: WNL   LOWER EXTREMITY MMT:    Hip strength: 4/5,  Knees: 4/5,    TODAY'S TREATMENT   07/04/22: Therapeutic Exercise: Aerobic: Supine:  Hip add Iso with ball x 20; Hip abd GTB x 20;  Supine march x20 ;  SLR 2 x 10 bil;  Seated: sit to stand x 10, cueing for mechanics.   Standing: Hip abd 2x10 bil;  slow march x 20;  Stretches: Sktc 30 sec x 3bil; Piriformis fig 4,  supine 30 sec x 3 ea bil;   Seated HSS 30 sec x 3 bil;   Neuromuscular Re-education: Manual Therapy:      06/27/22 Therapeutic Exercise: Aerobic: Supine: Pelvic tilts x 15; Hip add Iso with ball x 20; Hip abd GTB x 20;  Supine march GTB with TA x 15;  SLR 2 x 10 bil;  Seated: sit to stand x 10, cueing for mechanics.   Standing: Hip abd 2x10 bil; slow march x 20;  Stretches: Sktc 30 sec x 3bil; Piriformis fig 4, supine 30 sec x 3 ea bil;  LTR x 10; Seated HSS 30 sec x 3 bil;   Neuromuscular Re-education: Manual Therapy:       06/21/22: Therapeutic Exercise: Aerobic: Supine: Pelvic tilts x 15; Hip add Iso with ball x 20; Hip abd GTB x 20;  Supine march GTB with TA x 15;  SLR 2 x 10 bil;  Seated: sit to stand x 10, cueing for mechanics.   Standing: Hip abd 2x10 bil;  Stretches: Sktc 30 sec x 3bil; Piriformis fig 4, supine 30 sec x 3 ea bil;  LTR x 10; Seated HSS 30 sec x 3 bil;  Seated fwd flexion stretch for back 30 sec x 3;  Neuromuscular Re-education: Manual Therapy: long leg distraction/lumbar bil, gentle,       PATIENT EDUCATION:  Education details: updated HEP Person educated: Patient Education method: Explanation, Demonstration, Tactile cues, Verbal cues, and Handouts Education comprehension: verbalized understanding, returned demonstration, verbal cues required, tactile cues required, and needs further education   HOME EXERCISE PROGRAM: Access Code: [email or web address]    ASSESSMENT:  CLINICAL IMPRESSION: Pt with minimal soreness today. She is doing well with  ther ex, as well as functional activity at home. She states improved pain from previous weeks. Still having some soreness with initial standing, after sitting for a while. She has F/U with MD today. Has 1 more appt scheduled, she does not need to return if she is doing the same as she is today. Final HEP reviewed today.    OBJECTIVE IMPAIRMENTS decreased activity tolerance, decreased mobility, decreased strength, impaired  flexibility, and pain.   ACTIVITY LIMITATIONS lifting, bending, standing, stairs, transfers, and locomotion level  PARTICIPATION LIMITATIONS: cleaning, laundry, and community activity  PERSONAL FACTORS  none  are also affecting patient's functional outcome.   REHAB POTENTIAL: Good  CLINICAL DECISION MAKING: Stable/uncomplicated  EVALUATION COMPLEXITY: Low   GOALS: Goals reviewed with patient? Yes  SHORT TERM GOALS: Target date: 06/22/2022  Pt to be independent with initial HEP  Goal status: INITIAL      LONG TERM GOALS: Target date: 07/20/2022    Pt to be independent with final HEP  Goal status: INITIAL  2.  Pt to demo ability for sit to stand with optimal mechanics for Low back and LEs.   Goal status: INITIAL  3.  Pt to report decreased pain in SI/low back region with transfers and initial standing.   Goal status: INITIAL    PLAN: PT FREQUENCY: 2x/week  PT DURATION: 6 weeks  PLANNED INTERVENTIONS: Therapeutic exercises, Therapeutic activity, Neuromuscular re-education, Balance training, Gait training, Patient/Family education, Self Care, Joint mobilization, Joint manipulation, Stair training, DME instructions, Dry Needling, Spinal manipulation, Spinal mobilization, Cryotherapy, Moist heat, Taping, Traction, Ultrasound, Ionotophoresis 57m/ml Dexamethasone, and Manual therapy.  PLAN FOR NEXT SESSION:   LLyndee Hensen PT, DPT 12:06 PM  07/04/22   PHYSICAL THERAPY DISCHARGE SUMMARY  Visits from Start of Care: 6 Plan: Patient agrees to discharge.  Patient goals were met. Patient is being discharged due to meeting the stated rehab goals.    LLyndee Hensen PT, DPT 9:48 AM  08/30/22

## 2022-07-04 NOTE — Progress Notes (Signed)
OFFICE VISIT  07/04/2022  CC:  Chief Complaint  Patient presents with   Follow-up    Back pain; still going to PT for relief. She has had 8 appts. During PT appt today, it was mentioned she may not need to come back unless provider thought so.   Patient is a 88 y.o. female who presents for 4-week follow-up low back pain. A/P as of last visit: "1) Mechanical low back pain, with a prominent component of SI joint pain. Due to her age we will check lumbosacral plain films. Meloxicam 7.5 mg a day x10 days. Physical therapy referral--[facility]. Consider diagnostic/therapeutic SI joint injection if not significantly improving with physical therapy in 4 to 6 weeks.   #2 facial rash, most consistent with acne rosacea.  Differential diagnosis: Cutaneous lupus. Prescribed metronidazole 0.75% cream twice daily."  INTERIM HX: She is happy to report that her back feels much better.  She has been doing physical therapy, most recent visit was today.  Has some intermittent mild pain in the lumbosacral region.  No radiation.  L-spine plain films at the time of last visit: "IMPRESSION: 1. Progression of degenerative changes most significant at L1-L2 and L5-S1. 2. Stable grade 1 anterolisthesis at L1-L2 and L4-L5, likely degenerative. 3. No acute fracture identified."   Past Medical History:  Diagnosis Date   Corneal dystrophies, hereditary    Diabetes mellitus 10/2010   A1c 6.7%    Diverticulosis    Dry eye syndrome    Hyperlipemia    Hypertension    Hypothyroidism    Lumbar spondylosis 05/2016   x-ray   Macular degeneration, bilateral    Osteoarthritis    Presbyopia    Transfusion history 1959   post partum    Past Surgical History:  Procedure Laterality Date   APPENDECTOMY  1959   CATARACT EXTRACTION, BILATERAL     Dr Bing Plume   CESAREAN SECTION  [phone]; 1959   COLECTOMY     18 inches removed 2002-2003 for Diverticulitis   COLONOSCOPY     Tics,Dr Sam  Van--pt declines any further screening as of 12/2015.   DEXA  06/30/2016;10/13/19   2017 T score 2.5.  Rpt 2020 NORMAL.  01/2022 T score -1.2   VAGINAL HYSTERECTOMY  1975   For Fibroids (no BSO)    Outpatient Medications Prior to Visit  Medication Sig Dispense Refill   atorvastatin (LIPITOR) 40 MG tablet Take 1 tablet (40 mg total) by mouth daily. 90 tablet 3   cycloSPORINE (RESTASIS) 0.05 % ophthalmic emulsion Place 1 drop into both eyes 2 (two) times daily.     glucose blood (ACCU-CHEK GUIDE) test strip Use to check blood sugar once daily. 100 each 3   hydrochlorothiazide (HYDRODIURIL) 12.5 MG tablet Take 1 tablet (12.5 mg total) by mouth daily. 90 tablet 3   levothyroxine (SYNTHROID) 50 MCG tablet TAKE 1 TABLET EVERY DAY  EXCEPT TAKE 1/2 TABLET ON TUESDAY, THURSDAY, AND SATURDAY 84 tablet 3   meloxicam (MOBIC) 7.5 MG tablet Take 1 tablet (7.5 mg total) by mouth daily. 10 tablet 0   metroNIDAZOLE (METROCREAM) 0.75 % cream Apply topically 2 (two) times daily. 45 g 1   ramipril (ALTACE) 5 MG capsule Take 1 capsule (5 mg total) by mouth daily. 90 capsule 3   No facility-administered medications prior to visit.    Allergies  Allergen Reactions   Doxycycline Hyclate Diarrhea   Penicillins     Rash Because of a history of documented adverse serious drug  reaction;Medi Alert bracelet  is recommended   Tetanus Toxoid     REACTION: Arm swelling-severe Because of a history of documented adverse serious drug reaction;Medi Alert bracelet  is recommended   Metformin And Related Nausea And Vomiting and Other (See Comments)    Abdominal pain    ROS As per HPI  PE:    07/04/2022    1:49 PM 06/06/2022    1:24 PM 04/20/2022    9:40 AM  Vitals with BMI  Height '5\' 6"'$  '5\' 6"'$  '5\' 6"'$   Weight 163 lbs 3 oz 161 lbs 6 oz 156 lbs 10 oz  BMI 26.35 83.29 19.16  Systolic 606 004 599  Diastolic 74 76 80  Pulse 78 67 59   Physical Exam  General: Alert and well-appearing. Affect is pleasant, lucid  thought and speech. No further exam today.  LABS:  Last CBC Lab Results  Component Value Date   WBC 5.8 01/18/2022   HGB 14.4 01/18/2022   HCT 43.5 01/18/2022   MCV 91.8 01/18/2022   RDW 13.4 01/18/2022   PLT 216.0 77/41/4239   Last metabolic panel Lab Results  Component Value Date   GLUCOSE 124 (H) 01/18/2022   NA 142 01/18/2022   K 3.9 01/18/2022   CL 101 01/18/2022   CO2 35 (H) 01/18/2022   BUN 17 01/18/2022   CREATININE 0.77 01/18/2022   CALCIUM 9.4 01/18/2022   PROT 6.4 01/18/2022   ALBUMIN 4.0 01/18/2022   BILITOT 0.8 01/18/2022   ALKPHOS 72 01/18/2022   AST 19 01/18/2022   ALT 18 01/18/2022   Lab Results  Component Value Date   HGBA1C 7.0 (A) 04/20/2022   HGBA1C 7.0 04/20/2022   HGBA1C 7.0 (A) 04/20/2022   HGBA1C 7.0 04/20/2022   IMPRESSION AND PLAN:  Mechanical low back pain. She has some degenerative lumbar changes.  She has improved appropriately with outpatient PT. She will continue with home exercises.  An After Visit Summary was printed and given to the patient.  FOLLOW UP: Return in about 4 weeks (around 08/01/2022) for routine chronic illness f/u.  Signed:  Crissie Sickles, MD           07/04/2022

## 2022-07-19 ENCOUNTER — Encounter: Payer: Medicare HMO | Admitting: Physical Therapy

## 2022-08-03 ENCOUNTER — Ambulatory Visit: Payer: Medicare HMO | Admitting: Family Medicine

## 2022-08-06 ENCOUNTER — Encounter: Payer: Self-pay | Admitting: Family Medicine

## 2022-08-06 ENCOUNTER — Ambulatory Visit (INDEPENDENT_AMBULATORY_CARE_PROVIDER_SITE_OTHER): Payer: Medicare HMO | Admitting: Family Medicine

## 2022-08-06 VITALS — BP 130/79 | HR 64 | Temp 97.6°F | Ht 66.0 in | Wt 166.4 lb

## 2022-08-06 DIAGNOSIS — E119 Type 2 diabetes mellitus without complications: Secondary | ICD-10-CM | POA: Diagnosis not present

## 2022-08-06 DIAGNOSIS — Z23 Encounter for immunization: Secondary | ICD-10-CM | POA: Diagnosis not present

## 2022-08-06 DIAGNOSIS — E7800 Pure hypercholesterolemia, unspecified: Secondary | ICD-10-CM

## 2022-08-06 DIAGNOSIS — E039 Hypothyroidism, unspecified: Secondary | ICD-10-CM | POA: Diagnosis not present

## 2022-08-06 DIAGNOSIS — I10 Essential (primary) hypertension: Secondary | ICD-10-CM

## 2022-08-06 LAB — COMPREHENSIVE METABOLIC PANEL
ALT: 20 U/L (ref 0–35)
AST: 18 U/L (ref 0–37)
Albumin: 3.7 g/dL (ref 3.5–5.2)
Alkaline Phosphatase: 72 U/L (ref 39–117)
BUN: 21 mg/dL (ref 6–23)
CO2: 33 mEq/L — ABNORMAL HIGH (ref 19–32)
Calcium: 9.1 mg/dL (ref 8.4–10.5)
Chloride: 102 mEq/L (ref 96–112)
Creatinine, Ser: 0.72 mg/dL (ref 0.40–1.20)
GFR: 75.8 mL/min (ref >60.00)
Glucose, Bld: 159 mg/dL — ABNORMAL HIGH (ref 70–99)
Potassium: 4.1 mEq/L (ref 3.5–5.1)
Sodium: 141 mEq/L (ref 135–145)
Total Bilirubin: 0.6 mg/dL (ref 0.2–1.2)
Total Protein: 6.2 g/dL (ref 6.0–8.3)

## 2022-08-06 LAB — TSH: TSH: 0.42 u[IU]/mL (ref 0.35–5.50)

## 2022-08-06 LAB — POCT GLYCOSYLATED HEMOGLOBIN (HGB A1C)
HbA1c POC (<> result, manual entry): 7.1 % (ref 4.0–5.6)
HbA1c, POC (controlled diabetic range): 7.1 % — AB (ref 0.0–7.0)
HbA1c, POC (prediabetic range): 7.1 % — AB (ref 5.7–6.4)
Hemoglobin A1C: 7.1 % — AB (ref 4.0–5.6)

## 2022-08-06 MED ORDER — ACCU-CHEK GUIDE VI STRP: ORAL_STRIP | 3 refills | Status: DC

## 2022-08-06 NOTE — Progress Notes (Signed)
OFFICE VISIT  08/06/2022  CC:  Chief Complaint  Patient presents with   Diabetes   Hypertension   Hyperlipidemia    Pt is not fasting    Patient is a 88 y.o. female who presents for 3 mo f/u diet-controlled diabetes, hypertension, hypothyroidism, and hyperlipidemia.  INTERIM HX: Shannon Greene continues to do better and better. Just took a long trip to [place] with her family.  Enjoyed this.  She has not paid attention to her diet last few months which is perfectly understandable at this time.   Past Medical History:  Diagnosis Date   Corneal dystrophies, hereditary    Diabetes mellitus 10/2010   A1c 6.7%    Diverticulosis    Dry eye syndrome    Hyperlipemia    Hypertension    Hypothyroidism    Lumbar spondylosis 05/2016   x-ray   Macular degeneration, bilateral    Osteoarthritis    Presbyopia    Transfusion history 1959   post partum    Past Surgical History:  Procedure Laterality Date   APPENDECTOMY  1959   CATARACT EXTRACTION, BILATERAL     Dr Bing Plume   CESAREAN SECTION  [phone]; 1959   COLECTOMY     18 inches removed 2002-2003 for Diverticulitis   COLONOSCOPY     Tics,Dr Sam Bradley--pt declines any further screening as of 12/2015.   DEXA  06/30/2016;10/13/19   2017 T score 2.5.  Rpt 2020 NORMAL.  01/2022 T score -1.2   VAGINAL HYSTERECTOMY  1975   For Fibroids (no BSO)    Outpatient Medications Prior to Visit  Medication Sig Dispense Refill   atorvastatin (LIPITOR) 40 MG tablet Take 1 tablet (40 mg total) by mouth daily. 90 tablet 3   cycloSPORINE (RESTASIS) 0.05 % ophthalmic emulsion Place 1 drop into both eyes 2 (two) times daily.     glucose blood (ACCU-CHEK GUIDE) test strip Use to check blood sugar once daily. 100 each 3   hydrochlorothiazide (HYDRODIURIL) 12.5 MG tablet Take 1 tablet (12.5 mg total) by mouth daily. 90 tablet 3   levothyroxine (SYNTHROID) 50 MCG tablet TAKE 1 TABLET EVERY DAY  EXCEPT TAKE 1/2 TABLET ON TUESDAY, THURSDAY, AND SATURDAY 84 tablet 3    metroNIDAZOLE (METROCREAM) 0.75 % cream Apply topically 2 (two) times daily. 45 g 1   ramipril (ALTACE) 5 MG capsule Take 1 capsule (5 mg total) by mouth daily. 90 capsule 3   meloxicam (MOBIC) 7.5 MG tablet Take 1 tablet (7.5 mg total) by mouth daily. (Patient not taking: Reported on 08/06/2022) 10 tablet 0   No facility-administered medications prior to visit.    Allergies  Allergen Reactions   Doxycycline Hyclate Diarrhea   Penicillins     Rash Because of a history of documented adverse serious drug reaction;Medi Alert bracelet  is recommended   Tetanus Toxoid     REACTION: Arm swelling-severe Because of a history of documented adverse serious drug reaction;Medi Alert bracelet  is recommended   Metformin And Related Nausea And Vomiting and Other (See Comments)    Abdominal pain    ROS As per HPI  PE:    08/06/2022    9:42 AM 07/04/2022    1:49 PM 06/06/2022    1:24 PM  Vitals with BMI  Height '5\' 6"'$  '5\' 6"'$  '5\' 6"'$   Weight 166 lbs 6 oz 163 lbs 3 oz 161 lbs 6 oz  BMI 26.87 40.98 11.91  Systolic 478 295 621  Diastolic 79 74 76  Pulse 64 78  67   Physical Exam  Gen: Alert, well appearing.  Patient is oriented to person, place, time, and situation. AFFECT: pleasant, lucid thought and speech. CV: RRR, no m/r/g.   LUNGS: CTA bilat, nonlabored resps, good aeration in all lung fields. EXT: no clubbing or cyanosis.  no edema.   LABS:  Last CBC Lab Results  Component Value Date   WBC 5.8 01/18/2022   HGB 14.4 01/18/2022   HCT 43.5 01/18/2022   MCV 91.8 01/18/2022   RDW 13.4 01/18/2022   PLT 216.0 98/09/9146   Last metabolic panel Lab Results  Component Value Date   GLUCOSE 124 (H) 01/18/2022   NA 142 01/18/2022   K 3.9 01/18/2022   CL 101 01/18/2022   CO2 35 (H) 01/18/2022   BUN 17 01/18/2022   CREATININE 0.77 01/18/2022   CALCIUM 9.4 01/18/2022   PROT 6.4 01/18/2022   ALBUMIN 4.0 01/18/2022   BILITOT 0.8 01/18/2022   ALKPHOS 72 01/18/2022   AST 19 01/18/2022    ALT 18 01/18/2022   Last lipids Lab Results  Component Value Date   CHOL 156 01/18/2022   HDL 68.00 01/18/2022   LDLCALC 74 01/18/2022   TRIG 70.0 01/18/2022   CHOLHDL 2 01/18/2022   Last hemoglobin A1c Lab Results  Component Value Date   HGBA1C 7.0 (A) 04/20/2022   HGBA1C 7.0 04/20/2022   HGBA1C 7.0 (A) 04/20/2022   HGBA1C 7.0 04/20/2022   Lab Results  Component Value Date   TSH 0.51 01/18/2022   IMPRESSION AND PLAN:  #1 diabetes, diet controlled. POC Hba1c today is 7.1%. Check urine microalbumin/creatinine next visit.  2 hypertension, well controlled on ramipril 5 mg a day and HCTZ 12.5 mg a day. Electrolytes and creatinine today.  3.  Hypercholesterolemia, doing well on atorvastatin 40 mg a day. Lipid panel at next follow-up visit in 3 months.  4.  Hypoth: Takes T4 on empty stomach w/out any other meds. TSH today.  An After Visit Summary was printed and given to the patient.  FOLLOW UP: No follow-ups on file. Next CPE 01/2023  Signed:  Crissie Sickles, MD           08/06/2022

## 2022-08-15 ENCOUNTER — Ambulatory Visit: Payer: Medicare HMO

## 2022-10-03 ENCOUNTER — Ambulatory Visit (INDEPENDENT_AMBULATORY_CARE_PROVIDER_SITE_OTHER): Payer: Medicare HMO

## 2022-10-03 DIAGNOSIS — Z0000 Encounter for general adult medical examination without abnormal findings: Secondary | ICD-10-CM

## 2022-10-03 NOTE — Patient Instructions (Signed)

## 2022-10-03 NOTE — Progress Notes (Signed)
Subjective:   Shannon Greene is a 88 y.o. female who presents for Medicare Annual (Subsequent) preventive examination.   I connected with  Ian Malkin on 10/03/22 by an audio only telemedicine application and verified that I am speaking with the correct person using two identifiers.   I discussed the limitations, risks, security and privacy concerns of performing an evaluation and management service by telephone and the availability of in person appointments. I also discussed with the patient that there may be a patient responsible charge related to this service. The patient expressed understanding and verbally consented to this telephonic visit.  Location of Patient: home Location of Provider: office  List any persons and their role that are participating in the visit with the patient.   [place], CMA  Review of Systems    Defer to PCP       Objective:    There were no vitals filed for this visit. There is no height or weight on file to calculate BMI.     10/03/2022    4:07 PM 06/15/2022    9:30 PM 08/10/2021   12:35 PM 08/03/2020    1:37 PM 07/30/2019    1:59 PM 07/04/2018    9:07 AM 06/24/2017   10:15 AM  Advanced Directives  Does Patient Have a Medical Advance Directive? No No Yes Yes Yes Yes Yes  Type of Personnel officer of [place];Living will [place];Living will [place];Living will Living will;Healthcare Power of [place] in Chart?   Yes - validated most recent copy scanned in chart (See row information) No - copy requested No - copy requested No - copy requested No - copy requested  Would patient like information on creating a medical advance directive? No - Patient declined No - Patient declined         Current Medications (verified) Outpatient Encounter Medications as of 10/03/2022  Medication Sig    atorvastatin (LIPITOR) 40 MG tablet Take 1 tablet (40 mg total) by mouth daily.   cycloSPORINE (RESTASIS) 0.05 % ophthalmic emulsion Place 1 drop into both eyes 2 (two) times daily.   glucose blood (ACCU-CHEK GUIDE) test strip Use to check blood sugar once daily.   hydrochlorothiazide (HYDRODIURIL) 12.5 MG tablet Take 1 tablet (12.5 mg total) by mouth daily.   levothyroxine (SYNTHROID) 50 MCG tablet TAKE 1 TABLET EVERY DAY  EXCEPT TAKE 1/2 TABLET ON TUESDAY, THURSDAY, AND SATURDAY   metroNIDAZOLE (METROCREAM) 0.75 % cream Apply topically 2 (two) times daily.   ramipril (ALTACE) 5 MG capsule Take 1 capsule (5 mg total) by mouth daily.   No facility-administered encounter medications on file as of 10/03/2022.    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   DEXA  06/30/2016;10/13/19   2017 T score 2.5.  Rpt 2020 NORMAL.  01/2022 T score -1.2   VAGINAL HYSTERECTOMY  1975   For Fibroids (no BSO)   Family History  Problem Relation Age of Onset   Heart attack Mother 30   Hypertension Mother    Tuberculosis Father    Diabetes Brother    Stroke Brother 2   Prostate cancer Brother    Breast cancer Neg Hx    Social History   Socioeconomic History   Marital status: Married     Spouse name: Not on file   Number of children: Not on file   Years of education: Not on file   Highest education level: Not on file  Occupational History   Not on file  Tobacco Use   Smoking status: Never   Smokeless tobacco: Never  Vaping Use   Vaping Use: Never used  Substance and Sexual Activity   Alcohol use: No   Drug use: No   Sexual activity: Not Currently  Other Topics Concern   Not on file  Social History Narrative   Widow as of 02/2022, 2 daughters.   Occupation: retired Facilities manager).   Silver sneakers.   No T/A/Ds.   Social Determinants of Health   Financial Resource Strain: Low Risk  (10/03/2022)   Overall Financial Resource Strain (CARDIA)    Difficulty of Paying Living Expenses: Not hard at all  Food Insecurity: No Food Insecurity (10/03/2022)   Hunger Vital Sign    Worried About Running Out of Food in the Last Year: Never true    Ran Out of Food in the Last Year: Never true  Transportation Needs: No Transportation Needs (10/03/2022)   PRAPARE - Hydrologist (Medical): No    Lack of Transportation (Non-Medical): No  Physical Activity: Inactive (10/03/2022)   Exercise Vital Sign    Days of Exercise per Week: 0 days    Minutes of Exercise per Session: 0 min  Stress: No Stress Concern Present (10/03/2022)   [place]    Feeling of Stress : Not at all  Social Connections: Moderately Integrated (10/03/2022)   Social Connection and Isolation Panel [NHANES]    Frequency of Communication with Friends and Family: Three times a week    Frequency of Social Gatherings with Friends and Family: More than three times a week    Attends Religious Services: More than 4 times per year    Active Member of Clubs or Organizations: Yes    Attends Archivist Meetings: 1 to 4 times per year    Marital Status: Widowed    Tobacco Counseling Counseling  given: Not Answered   Clinical Intake:  Pre-visit preparation completed: No  Pain : No/denies pain        How often do you need to have someone help you when you read instructions, pamphlets, or other written materials from your doctor or pharmacy?: 1 - Never  Diabetic?no  Interpreter Needed?: No      Activities of Daily Living    10/03/2022    4:08 PM  In your present state of health, do you have any difficulty performing the following activities:  Hearing? 0  Vision? 0  Difficulty concentrating or making decisions? 0  Walking or climbing stairs? 0  Dressing or bathing? 0  Doing errands, shopping? 0  Preparing Food and eating ? N  Using the Toilet? N  In the past six months, have you accidently leaked urine? N  Do you have problems with loss of bowel control? N  Managing your Medications? N  Managing your Finances? N  Housekeeping or managing your Housekeeping? N    Patient Care Team: Tammi Sou, MD as PCP - General (Family Medicine) Calvert Cantor, MD as Consulting Physician (Ophthalmology)  Indicate any recent Medical Services you may have received from other than [facility] providers in the past year (date may be approximate).     Assessment:   This is a routine wellness examination for Shannon Greene.  Hearing/Vision screen No results found.  Dietary issues and exercise activities discussed:     Goals Addressed             This Visit's Progress    Quality of Life Maintained       Evidence-based guidance:  Assess patient's thoughts about quality of life, goals and expectations, and dissatisfaction or desire to improve.  Identify issues of primary importance such as mental health, illness, exercise tolerance, pain, sexual function and intimacy, cognitive change, social isolation, finances and relationships.  Assess and monitor for signs/symptoms of psychosocial concerns, especially depression or ideations regarding harm to others or self; provide or  refer for mental health services as needed.  Identify sensory issues that impact quality of life such as hearing loss, vision deficit; strategize ways to maintain or improve hearing, vision.  Promote access to services in the community to support independence such as support groups, home visiting programs, financial assistance, handicapped parking tags, durable medical equipment and emergency responder.  Promote activities to decrease social isolation such as group support or social, leisure and recreational activities, employment, use of social media; consider safety concerns about being out of home for activities.  Provide patient an opportunity to share by storytelling or a "life review" to give positive meaning to life and to assist with coping and negative experiences.  Encourage patient to tap into hope to improve sense of self.  Counsel based on prognosis and as early as possible about end-of-life and palliative care; consider referral to palliative care provider.  Advocate for the development of palliative care plan that may include avoidance of unnecessary testing and intervention, symptom control, discontinuation of medications, hospice and organ donation.  Counsel as early as possible those with life-limiting chronic disease about palliative care; consider referral to palliative care provider.  Advocate for the development of palliative care plan.   Notes:      Quality of Life Maintained       Evidence-based guidance:  Assess patient's thoughts about quality of life, goals and expectations, and dissatisfaction or desire to improve.  Identify issues of primary importance such as mental health, illness, exercise tolerance, pain, sexual function and intimacy, cognitive change, social isolation, finances and relationships.  Assess and monitor for signs/symptoms of psychosocial concerns, especially depression or ideations regarding harm to others or self; provide or refer for mental health  services as needed.  Identify sensory issues that impact quality of life such as hearing loss, vision deficit; strategize ways to maintain or improve hearing, vision.  Promote access to services in the community to support independence such as support groups, home visiting programs, financial assistance, handicapped parking tags, durable medical equipment and emergency responder.  Promote activities to decrease social isolation such as  group support or social, leisure and recreational activities, employment, use of social media; consider safety concerns about being out of home for activities.  Provide patient an opportunity to share by storytelling or a "life review" to give positive meaning to life and to assist with coping and negative experiences.  Encourage patient to tap into hope to improve sense of self.  Counsel based on prognosis and as early as possible about end-of-life and palliative care; consider referral to palliative care provider.  Advocate for the development of palliative care plan that may include avoidance of unnecessary testing and intervention, symptom control, discontinuation of medications, hospice and organ donation.  Counsel as early as possible those with life-limiting chronic disease about palliative care; consider referral to palliative care provider.  Advocate for the development of palliative care plan.   Notes:       Depression Screen    10/03/2022    4:05 PM 08/06/2022    9:48 AM 06/06/2022    1:25 PM 08/10/2021    1:27 PM 01/19/2021    8:44 AM 08/03/2020    1:38 PM 07/30/2019    1:59 PM  PHQ 2/9 Scores  PHQ - 2 Score 0 0 0 0 0 0 0    Fall Risk    10/03/2022    4:08 PM 08/06/2022    9:48 AM 06/06/2022    1:25 PM 08/10/2021   12:34 PM 01/19/2021    8:44 AM  Fall Risk   Falls in the past year? 0 0 0 0 0  Number falls in past yr: 0 0 0 0 0  Injury with Fall? 0 0 0 0 0  Risk for fall due to : No Fall Risks Impaired vision Impaired vision    Follow up Falls  evaluation completed Falls evaluation completed Falls evaluation completed Falls evaluation completed;Falls prevention discussed Falls evaluation completed    FALL RISK PREVENTION PERTAINING TO THE HOME:  Any stairs in or around the home? No  If so, are there any without handrails? No  Home free of loose throw rugs in walkways, pet beds, electrical cords, etc? Yes  Adequate lighting in your home to reduce risk of falls? Yes   ASSISTIVE DEVICES UTILIZED TO PREVENT FALLS:  Life alert? No  Use of a cane, walker or w/c? No  Grab bars in the bathroom? Yes  Shower chair or bench in shower? Yes  Elevated toilet seat or a handicapped toilet? Yes   TIMED UP AND GO:  Was the test performed? No .  Length of time to ambulate 10 feet: n/a sec.     Cognitive Function:    07/30/2019    2:01 PM 07/04/2018    9:08 AM  MMSE - Mini Mental State Exam  Orientation to time 5 5  Orientation to Place 5 5  Registration 3 3  Attention/ Calculation 5 5  Recall 3 3  Language- name 2 objects 2 2  Language- repeat 1 1  Language- follow 3 step command 3 3  Language- read & follow direction 1 1  Write a sentence 1 1  Copy design 1 1  Total score 30 30        10/03/2022    4:09 PM  6CIT Screen  What Year? 0 points  What month? 0 points  What time? 0 points  Count back from 20 0 points  Months in reverse 0 points  Repeat phrase 0 points  Total Score 0 points    Immunizations Immunization History  Administered Date(s) Administered   Fluad Quad(high Dose 65+) 07/14/2019, 07/21/2020, 07/20/2021, 08/06/2022   Influenza Whole 07/06/2012   Influenza, High Dose Seasonal PF 07/19/2014, 07/28/2015, 08/05/2017, 07/15/2018   Influenza,inj,Quad PF,6+ Mos 07/31/2016   PFIZER(Purple Top)SARS-COV-2 Vaccination 12/17/2019, 01/09/2020   PNEUMOCOCCAL CONJUGATE-20 10/19/2021   Pneumococcal Conjugate-13 12/16/2015   Pneumococcal-Unspecified 11/05/2001   Td 12/24/2016   Zoster Recombinat (Shingrix)  03/29/2017, 08/12/2017   Zoster, Live 06/21/2016    TDAP status: Up to date  Flu Vaccine status: Up to date  Pneumococcal vaccine status: Up to date  Covid-19 vaccine status: Completed vaccines  Qualifies for Shingles Vaccine? Yes   Zostavax completed No   Shingrix Completed?: Yes  Screening Tests Health Maintenance  Topic Date Due   COVID-19 Vaccine (3 - Pfizer risk series) 02/06/2020   OPHTHALMOLOGY EXAM  06/28/2022   FOOT EXAM  07/20/2022   HEMOGLOBIN A1C  02/05/2023   Medicare Annual Wellness (AWV)  10/04/2023   DEXA SCAN  01/22/2025   DTaP/Tdap/Td (2 - Tdap) 12/24/2026   Pneumonia Vaccine 71+ Years old  Completed   INFLUENZA VACCINE  Completed   Zoster Vaccines- Shingrix  Completed   HPV VACCINES  Aged Out    Health Maintenance  Health Maintenance Due  Topic Date Due   COVID-19 Vaccine (3 - Pfizer risk series) 02/06/2020   OPHTHALMOLOGY EXAM  06/28/2022   FOOT EXAM  07/20/2022    Colorectal cancer screening: No longer required.   Mammogram status: No longer required due to age.  Bone Density status: Completed 01/22/22. Results reflect: Bone density results: OSTEOPENIA. Repeat every 3 years.  Lung Cancer Screening: (Low Dose CT Chest recommended if Age 41-80 years, 30 pack-year currently smoking OR have quit w/in 15years.) does not qualify.   Lung Cancer Screening Referral: n/a  Additional Screening:  Hepatitis C Screening: does not qualify; Completed n/a  Vision Screening: Recommended annual ophthalmology exams for early detection of glaucoma and other disorders of the eye. Is the patient up to date with their annual eye exam?  Yes  Who is the provider or what is the name of the office in which the patient attends annual eye exams? [facility] If pt is not established with a provider, would they like to be referred to a provider to establish care? No .   Dental Screening: Recommended annual dental exams for proper oral hygiene  Community  Resource Referral / Chronic Care Management: CRR required this visit?  No   CCM required this visit?  No      Plan:     I have personally reviewed and noted the following in the patient's chart:   Medical and social history Use of alcohol, tobacco or illicit drugs  Current medications and supplements including opioid prescriptions. Patient is not currently taking opioid prescriptions. Functional ability and status Nutritional status Physical activity Advanced directives List of other physicians Hospitalizations, surgeries, and ER visits in previous 12 months Vitals Screenings to include cognitive, depression, and falls Referrals and appointments  In addition, I have reviewed and discussed with patient certain preventive protocols, quality metrics, and best practice recommendations. A written personalized care plan for preventive services as well as general preventive health recommendations were provided to patient.     Beatrix Fetters, [place]   10/03/2022   Nurse Notes: Non-Face to Face or Face to Face 11 minute visit Encounter   Shannon Greene , Thank you for taking time to come for your Medicare Wellness Visit. I appreciate your ongoing commitment  to your health goals. Please review the following plan we discussed and let me know if I can assist you in the future.   These are the goals we discussed:  Goals       <enter goal here> (pt-stated)      Maintain current health.       Quality of Life Maintained      Evidence-based guidance:  Assess patient's thoughts about quality of life, goals and expectations, and dissatisfaction or desire to improve.  Identify issues of primary importance such as mental health, illness, exercise tolerance, pain, sexual function and intimacy, cognitive change, social isolation, finances and relationships.  Assess and monitor for signs/symptoms of psychosocial concerns, especially depression or ideations regarding harm to others or self; provide  or refer for mental health services as needed.  Identify sensory issues that impact quality of life such as hearing loss, vision deficit; strategize ways to maintain or improve hearing, vision.  Promote access to services in the community to support independence such as support groups, home visiting programs, financial assistance, handicapped parking tags, durable medical equipment and emergency responder.  Promote activities to decrease social isolation such as group support or social, leisure and recreational activities, employment, use of social media; consider safety concerns about being out of home for activities.  Provide patient an opportunity to share by storytelling or a "life review" to give positive meaning to life and to assist with coping and negative experiences.  Encourage patient to tap into hope to improve sense of self.  Counsel based on prognosis and as early as possible about end-of-life and palliative care; consider referral to palliative care provider.  Advocate for the development of palliative care plan that may include avoidance of unnecessary testing and intervention, symptom control, discontinuation of medications, hospice and organ donation.  Counsel as early as possible those with life-limiting chronic disease about palliative care; consider referral to palliative care provider.  Advocate for the development of palliative care plan.   Notes:       Quality of Life Maintained      Evidence-based guidance:  Assess patient's thoughts about quality of life, goals and expectations, and dissatisfaction or desire to improve.  Identify issues of primary importance such as mental health, illness, exercise tolerance, pain, sexual function and intimacy, cognitive change, social isolation, finances and relationships.  Assess and monitor for signs/symptoms of psychosocial concerns, especially depression or ideations regarding harm to others or self; provide or refer for mental health  services as needed.  Identify sensory issues that impact quality of life such as hearing loss, vision deficit; strategize ways to maintain or improve hearing, vision.  Promote access to services in the community to support independence such as support groups, home visiting programs, financial assistance, handicapped parking tags, durable medical equipment and emergency responder.  Promote activities to decrease social isolation such as group support or social, leisure and recreational activities, employment, use of social media; consider safety concerns about being out of home for activities.  Provide patient an opportunity to share by storytelling or a "life review" to give positive meaning to life and to assist with coping and negative experiences.  Encourage patient to tap into hope to improve sense of self.  Counsel based on prognosis and as early as possible about end-of-life and palliative care; consider referral to palliative care provider.  Advocate for the development of palliative care plan that may include avoidance of unnecessary testing and intervention, symptom control, discontinuation of medications, hospice and organ  donation.  Counsel as early as possible those with life-limiting chronic disease about palliative care; consider referral to palliative care provider.  Advocate for the development of palliative care plan.   Notes:         This is a list of the screening recommended for you and due dates:  Health Maintenance  Topic Date Due   COVID-19 Vaccine (3 - Pfizer risk series) 02/06/2020   Eye exam for diabetics  06/28/2022   Complete foot exam   07/20/2022   Hemoglobin A1C  02/05/2023   Medicare Annual Wellness Visit  10/04/2023   DEXA scan (bone density measurement)  01/22/2025   DTaP/Tdap/Td vaccine (2 - Tdap) 12/24/2026   Pneumonia Vaccine  Completed   Flu Shot  Completed   Zoster (Shingles) Vaccine  Completed   HPV Vaccine  Aged Out

## 2022-10-08 ENCOUNTER — Other Ambulatory Visit: Payer: Self-pay | Admitting: Family Medicine

## 2022-10-15 ENCOUNTER — Ambulatory Visit (INDEPENDENT_AMBULATORY_CARE_PROVIDER_SITE_OTHER): Payer: Medicare HMO | Admitting: Family Medicine

## 2022-10-15 ENCOUNTER — Encounter: Payer: Self-pay | Admitting: Family Medicine

## 2022-10-15 VITALS — BP 126/80 | HR 74 | Temp 97.5°F | Ht 66.0 in | Wt 165.8 lb

## 2022-10-15 DIAGNOSIS — J069 Acute upper respiratory infection, unspecified: Secondary | ICD-10-CM | POA: Diagnosis not present

## 2022-10-15 LAB — POCT INFLUENZA A/B
Influenza A, POC: NEGATIVE
Influenza B, POC: NEGATIVE

## 2022-10-15 LAB — POC COVID19 BINAXNOW: SARS Coronavirus 2 Ag: NEGATIVE

## 2022-10-15 NOTE — Progress Notes (Signed)
OFFICE VISIT  10/15/2022  CC:  Chief Complaint  Patient presents with   Headache   Nasal drainage   Patient is a 88 y.o. female who presents for nasal congestion.  HPI: Onset 4 to 5 days ago of nasal congestion, some postnasal drip, some headaches. No cough, no fever, no wheezing or shortness of breath. No body aches.   Past Medical History:  Diagnosis Date   Corneal dystrophies, hereditary    Diabetes mellitus 10/2010   A1c 6.7%    Diverticulosis    Dry eye syndrome    Hyperlipemia    Hypertension    Hypothyroidism    Lumbar spondylosis 05/2016   x-ray   Macular degeneration, bilateral    Osteoarthritis    Presbyopia    Transfusion history 1959   post partum    Past Surgical History:  Procedure Laterality Date   APPENDECTOMY  1959   CATARACT EXTRACTION, BILATERAL     Dr Bing Plume   CESAREAN SECTION  [phone]; 1959   COLECTOMY     18 inches removed 2002-2003 for Diverticulitis   COLONOSCOPY     Tics,Dr Sam [place]--pt declines any further screening as of 12/2015.   DEXA  06/30/2016;10/13/19   2017 T score 2.5.  Rpt 2020 NORMAL.  01/2022 T score -1.2   VAGINAL HYSTERECTOMY  1975   For Fibroids (no BSO)    Outpatient Medications Prior to Visit  Medication Sig Dispense Refill   atorvastatin (LIPITOR) 40 MG tablet Take 1 tablet (40 mg total) by mouth daily. 90 tablet 3   cycloSPORINE (RESTASIS) 0.05 % ophthalmic emulsion Place 1 drop into both eyes 2 (two) times daily.     glucose blood (ACCU-CHEK GUIDE) test strip Use to check blood sugar once daily. 200 each 3   hydrochlorothiazide (HYDRODIURIL) 12.5 MG tablet Take 1 tablet (12.5 mg total) by mouth daily. 90 tablet 3   levothyroxine (SYNTHROID) 50 MCG tablet TAKE 1 TABLET EVERY DAY EXCEPT TAKE 1/2 TABLET ON TUESDAY, THURSDAY, AND SATURDAY 72 tablet 0   metroNIDAZOLE (METROCREAM) 0.75 % cream Apply topically 2 (two) times daily. 45 g 1   ramipril (ALTACE) 5 MG capsule Take 1 capsule (5 mg total) by mouth daily. 90  capsule 3   No facility-administered medications prior to visit.    Allergies  Allergen Reactions   Doxycycline Hyclate Diarrhea   Penicillins     Rash Because of a history of documented adverse serious drug reaction;Medi Alert bracelet  is recommended   Tetanus Toxoid     REACTION: Arm swelling-severe Because of a history of documented adverse serious drug reaction;Medi Alert bracelet  is recommended   Metformin And Related Nausea And Vomiting and Other (See Comments)    Abdominal pain    ROS As per HPI  PE:    10/15/2022    9:51 AM 08/06/2022    9:42 AM 07/04/2022    1:49 PM  Vitals with BMI  Height '5\' 6"'$  '5\' 6"'$  '5\' 6"'$   Weight 165 lbs 13 oz 166 lbs 6 oz 163 lbs 3 oz  BMI 26.77 66.44 03.47  Systolic 425 956 387  Diastolic 80 79 74  Pulse 74 64 78     Physical Exam  VS: noted--normal. Gen: alert, NAD, NONTOXIC APPEARING. HEENT: eyes without injection, drainage, or swelling.  Ears: EACs clear, TMs with normal light reflex and landmarks.  Nose: Clear rhinorrhea, with some dried, crusty exudate adherent to mildly injected mucosa.  No purulent d/c.  No paranasal sinus TTP.  No facial swelling.  Throat and mouth without focal lesion.  No pharyngial swelling, erythema, or exudate.   Neck: supple, no LAD.   LUNGS: CTA bilat, nonlabored resps.   CV: RRR, no m/r/g. EXT: no c/c/e SKIN: no rash   LABS:  Last CBC Lab Results  Component Value Date   WBC 5.8 01/18/2022   HGB 14.4 01/18/2022   HCT 43.5 01/18/2022   MCV 91.8 01/18/2022   RDW 13.4 01/18/2022   PLT 216.0 22/48/2500   Last metabolic panel Lab Results  Component Value Date   GLUCOSE 159 (H) 08/06/2022   NA 141 08/06/2022   K 4.1 08/06/2022   CL 102 08/06/2022   CO2 33 (H) 08/06/2022   BUN 21 08/06/2022   CREATININE 0.72 08/06/2022   CALCIUM 9.1 08/06/2022   PROT 6.2 08/06/2022   ALBUMIN 3.7 08/06/2022   BILITOT 0.6 08/06/2022   ALKPHOS 72 08/06/2022   AST 18 08/06/2022   ALT 20 08/06/2022   Last  hemoglobin A1c Lab Results  Component Value Date   HGBA1C 7.1 (A) 08/06/2022   HGBA1C 7.1 08/06/2022   HGBA1C 7.1 (A) 08/06/2022   HGBA1C 7.1 (A) 08/06/2022   IMPRESSION AND PLAN:  Viral URI. Symptomatic care discussed. Signs/symptoms to call or return for were reviewed and pt expressed understanding.  Rapid COVID test today negative.  An After Visit Summary was printed and given to the patient.  FOLLOW UP: Return if symptoms worsen or fail to improve.  Signed:  Crissie Sickles, MD           10/15/2022

## 2022-10-15 NOTE — Addendum Note (Signed)
Addended by: Deveron Furlong D on: 10/15/2022 10:34 AM   Modules accepted: Orders

## 2022-10-18 ENCOUNTER — Telehealth: Payer: Self-pay

## 2022-10-18 MED ORDER — AZITHROMYCIN 250 MG PO TABS: ORAL_TABLET | ORAL | 0 refills | Status: DC

## 2022-10-18 NOTE — Telephone Encounter (Signed)
Pls eRx azithromycin '250mg'$  tabs, 2 tabs po qd x 1d then 1 tab po qd x 4d, #6, no RF.

## 2022-10-18 NOTE — Telephone Encounter (Signed)
Patient seen by Dr. Anitra Lauth on Monday.  She states she is not any better; does Dr. Anitra Lauth want her to keep taking Tylenol and possibly prescribe something for URI.  Patient is requesting to speak with Vevelyn Royals if possible.  [phone]

## 2022-10-18 NOTE — Telephone Encounter (Signed)
Patient advised medication refilled.

## 2022-10-18 NOTE — Telephone Encounter (Signed)
Please advise if patient can be prescribed something

## 2022-10-24 ENCOUNTER — Telehealth: Payer: Self-pay | Admitting: Family Medicine

## 2022-10-24 NOTE — Telephone Encounter (Signed)
Pt calls and states the z pak has not seemed to help much and she is asking if something else can be called in for her. I advised her that we are currently booked for appointments,  she declined to schedule next available at this time.

## 2022-10-24 NOTE — Telephone Encounter (Signed)
Please Advise

## 2022-10-30 NOTE — Telephone Encounter (Signed)
Spoke with patient, she is feeling better.

## 2022-10-30 NOTE — Telephone Encounter (Signed)
Pls check with pt to see if she has improved since this phone message on 12/20.

## 2022-11-08 ENCOUNTER — Ambulatory Visit (INDEPENDENT_AMBULATORY_CARE_PROVIDER_SITE_OTHER): Payer: Medicare Other | Admitting: Family Medicine

## 2022-11-08 ENCOUNTER — Encounter: Payer: Self-pay | Admitting: Family Medicine

## 2022-11-08 VITALS — BP 191/99 | HR 82 | Temp 97.5°F | Ht 66.0 in | Wt 166.2 lb

## 2022-11-08 DIAGNOSIS — E119 Type 2 diabetes mellitus without complications: Secondary | ICD-10-CM | POA: Diagnosis not present

## 2022-11-08 DIAGNOSIS — I10 Essential (primary) hypertension: Secondary | ICD-10-CM | POA: Diagnosis not present

## 2022-11-08 DIAGNOSIS — E785 Hyperlipidemia, unspecified: Secondary | ICD-10-CM

## 2022-11-08 LAB — POCT GLYCOSYLATED HEMOGLOBIN (HGB A1C)
HbA1c POC (<> result, manual entry): 7.4 % (ref 4.0–5.6)
HbA1c, POC (controlled diabetic range): 7.4 % — AB (ref 0.0–7.0)
HbA1c, POC (prediabetic range): 7.4 % — AB (ref 5.7–6.4)
Hemoglobin A1C: 7.4 % — AB (ref 4.0–5.6)

## 2022-11-08 LAB — MICROALBUMIN / CREATININE URINE RATIO
Creatinine,U: 193.4 mg/dL
Microalb Creat Ratio: 1.3 mg/g (ref 0.0–30.0)
Microalb, Ur: 2.5 mg/dL — ABNORMAL HIGH (ref 0.0–1.9)

## 2022-11-08 MED ORDER — PIOGLITAZONE HCL 15 MG PO TABS: 15.0000 mg | ORAL_TABLET | Freq: Every day | ORAL | 1 refills | Status: DC

## 2022-11-08 MED ORDER — HYDROCHLOROTHIAZIDE 25 MG PO TABS: 25.0000 mg | ORAL_TABLET | Freq: Every day | ORAL | 1 refills | Status: DC

## 2022-11-08 NOTE — Progress Notes (Signed)
OFFICE VISIT  11/08/2022  CC:  Chief Complaint  Patient presents with   Medical Management of Chronic Issues    Pt is fasting    Patient is a 89 y.o. female who presents for 2-monthfollow-up diabetes, hypertension, anxiety.  INTERIM HX: Grieving and dealing with some tough family situations.  Getting to sleep is difficult.  Tried melatonin 5 mg but no help.  In the past she has tried Ambien and this caused paradoxic effect.  Glucoses in the 120s to 130s fasting.  Blood pressures have been up more lately at home: 150s to 160s at times.  Diastolics sometimes up into the 80s and low 90s.  However, quite a few measurements in the 120s to 130s over 60s to 70s. Denies headaches, visual abnormalities, chest pain, shortness of breath, or lower extremity swelling.  ROS as above, plus--> no fevers,no wheezing, no cough, no dizziness, no rashes, no melena/hematochezia.  No polyuria or polydipsia.  No myalgias or arthralgias.  No focal weakness, paresthesias, or tremors.  No acute vision or hearing abnormalities.  No dysuria or unusual/new urinary urgency or frequency.  No n/v/d or abd pain.  No palpitations.    Past Medical History:  Diagnosis Date   Corneal dystrophies, hereditary    Diabetes mellitus 10/2010   A1c 6.7%    Diverticulosis    Dry eye syndrome    Hyperlipemia    Hypertension    Hypothyroidism    Lumbar spondylosis 05/2016   x-ray   Macular degeneration, bilateral    Osteoarthritis    Presbyopia    Transfusion history 1959   post partum    Past Surgical History:  Procedure Laterality Date   APPENDECTOMY  1959   CATARACT EXTRACTION, BILATERAL     Dr DBing Plume  CESAREAN SECTION  [phone] 1959   COLECTOMY     18 inches removed 2002-2003 for Diverticulitis   COLONOSCOPY     Tics,Dr Sam Beadle--pt declines any further screening as of 12/2015.   DEXA  06/30/2016;10/13/19   2017 T score 2.5.  Rpt 2020 NORMAL.  01/2022 T score -1.2   VAGINAL HYSTERECTOMY  1975   For Fibroids  (no BSO)    Outpatient Medications Prior to Visit  Medication Sig Dispense Refill   atorvastatin (LIPITOR) 40 MG tablet Take 1 tablet (40 mg total) by mouth daily. 90 tablet 3   cycloSPORINE (RESTASIS) 0.05 % ophthalmic emulsion Place 1 drop into both eyes 2 (two) times daily.     glucose blood (ACCU-CHEK GUIDE) test strip Use to check blood sugar once daily. 200 each 3   levothyroxine (SYNTHROID) 50 MCG tablet TAKE 1 TABLET EVERY DAY EXCEPT TAKE 1/2 TABLET ON TUESDAY, THURSDAY, AND SATURDAY 72 tablet 0   metroNIDAZOLE (METROCREAM) 0.75 % cream Apply topically 2 (two) times daily. 45 g 1   ramipril (ALTACE) 5 MG capsule Take 1 capsule (5 mg total) by mouth daily. 90 capsule 3   hydrochlorothiazide (HYDRODIURIL) 12.5 MG tablet Take 1 tablet (12.5 mg total) by mouth daily. 90 tablet 3   azithromycin (ZITHROMAX) 250 MG tablet 2 tabs po qd x 1d then 1 tab po qd x 4d 6 each 0   No facility-administered medications prior to visit.    Allergies  Allergen Reactions   Doxycycline Hyclate Diarrhea   Penicillins     Rash Because of a history of documented adverse serious drug reaction;Medi Alert bracelet  is recommended   Tetanus Toxoid     REACTION: Arm swelling-severe Because  of a history of documented adverse serious drug reaction;Medi Alert bracelet  is recommended   Metformin And Related Nausea And Vomiting and Other (See Comments)    Abdominal pain    Review of Systems As per HPI  PE:    11/08/2022    9:43 AM 11/08/2022    9:31 AM 10/15/2022    9:51 AM  Vitals with BMI  Height  _0  _1   Weight  166 lbs 3 oz 165 lbs 13 oz  BMI  55.97 41.63  Systolic 845 364 680  Diastolic 99 84 80  Pulse 82 84 74    Physical Exam  Gen: Alert, well appearing.  Patient is oriented to person, place, time, and situation. AFFECT: pleasant, lucid thought and speech. CV: RRR, no m/r/g.   LUNGS: CTA bilat, nonlabored resps, good aeration in all lung fields. EXT: no clubbing or cyanosis.  no  edema.    LABS:  Last CBC Lab Results  Component Value Date   WBC 5.8 01/18/2022   HGB 14.4 01/18/2022   HCT 43.5 01/18/2022   MCV 91.8 01/18/2022   RDW 13.4 01/18/2022   PLT 216.0 32/10/2481   Last metabolic panel Lab Results  Component Value Date   GLUCOSE 159 (H) 08/06/2022   NA 141 08/06/2022   K 4.1 08/06/2022   CL 102 08/06/2022   CO2 33 (H) 08/06/2022   BUN 21 08/06/2022   CREATININE 0.72 08/06/2022   CALCIUM 9.1 08/06/2022   PROT 6.2 08/06/2022   ALBUMIN 3.7 08/06/2022   BILITOT 0.6 08/06/2022   ALKPHOS 72 08/06/2022   AST 18 08/06/2022   ALT 20 08/06/2022   Last lipids Lab Results  Component Value Date   CHOL 156 01/18/2022   HDL 68.00 01/18/2022   LDLCALC 74 01/18/2022   TRIG 70.0 01/18/2022   CHOLHDL 2 01/18/2022   Last hemoglobin A1c Lab Results  Component Value Date   HGBA1C 7.4 (A) 11/08/2022   HGBA1C 7.4 11/08/2022   HGBA1C 7.4 (A) 11/08/2022   HGBA1C 7.4 (A) 11/08/2022   Last thyroid functions Lab Results  Component Value Date   TSH 0.42 08/06/2022   IMPRESSION AND PLAN:  #1 uncontrolled hypertension. Excessive stress/anxiety is likely playing at least some role in this. Increase HCTZ to 25 mg a day and continue ramipril 5 mg a day. Continue home blood pressure monitoring and we will recheck in 2 weeks. No labs today but will recheck be met in 2 weeks.  2.  Diabetes, fairly good control but A1c is gradually rising (7.4% today). Intolerant to metformin in the past. Will get her on Actos 15 mg a day today.  #3 anxiety/grieving.  She has some insomnia related to this. She will use Benadryl nightly as needed.  An After Visit Summary was printed and given to the patient.  FOLLOW UP: Return in about 2 weeks (around 11/22/2022) for f/u HTN. Next CPE 01/2023  Signed:  Crissie Sickles, MD           11/08/2022

## 2022-11-08 NOTE — Patient Instructions (Addendum)
Increase your hctz (hydrochlorothiazide) to '25mg'$  daily (rx sent in).  Start pioglitazone '15mg'$  tab once a day for your diabetes (rx sent in).  Still quite stressed with

## 2022-11-09 ENCOUNTER — Telehealth: Payer: Self-pay | Admitting: Family Medicine

## 2022-11-09 MED ORDER — PIOGLITAZONE HCL 15 MG PO TABS: 15.0000 mg | ORAL_TABLET | Freq: Every day | ORAL | 1 refills | Status: DC

## 2022-11-09 MED ORDER — HYDROCHLOROTHIAZIDE 25 MG PO TABS: 25.0000 mg | ORAL_TABLET | Freq: Every day | ORAL | 1 refills | Status: DC

## 2022-11-09 NOTE — Telephone Encounter (Signed)
Pt's medications were not sent to the correct pharmacy. She no longer has insurance coverage for the mail order pharmacy. She says the medications that were prescribed yesterday should go to her local pharmacy [facility] on [address]. Please give the patient a call to confirm her pharmacy.

## 2022-11-09 NOTE — Telephone Encounter (Signed)
Pt advised of medications resent to local pharmacy.

## 2022-11-13 ENCOUNTER — Other Ambulatory Visit: Payer: Self-pay | Admitting: Family Medicine

## 2022-11-13 DIAGNOSIS — Z1231 Encounter for screening mammogram for malignant neoplasm of breast: Secondary | ICD-10-CM

## 2022-11-22 ENCOUNTER — Ambulatory Visit (INDEPENDENT_AMBULATORY_CARE_PROVIDER_SITE_OTHER): Payer: Medicare Other | Admitting: Family Medicine

## 2022-11-22 ENCOUNTER — Encounter: Payer: Self-pay | Admitting: Family Medicine

## 2022-11-22 VITALS — BP 113/72 | HR 74 | Temp 97.9°F | Ht 66.0 in | Wt 167.6 lb

## 2022-11-22 DIAGNOSIS — I10 Essential (primary) hypertension: Secondary | ICD-10-CM | POA: Diagnosis not present

## 2022-11-22 NOTE — Progress Notes (Signed)
OFFICE VISIT  11/22/2022  CC:  Chief Complaint  Patient presents with   Hypertension    Follow up; pt is not fasting    Patient is a 89 y.o. female who presents for 2-week follow-up hypertension. A/P as of last visit: "#1 uncontrolled hypertension. Excessive stress/anxiety is likely playing at least some role in this. Increase HCTZ to 25 mg a day and continue ramipril 5 mg a day. Continue home blood pressure monitoring and we will recheck in 2 weeks. No labs today but will recheck be met in 2 weeks.   2.  Diabetes, fairly good control but A1c is gradually rising (7.4% today). Intolerant to metformin in the past. Will get her on Actos 15 mg a day today.   #3 anxiety/grieving.  She has some insomnia related to this. She will use Benadryl nightly as needed."  INTERIM HX: She feels very well.  Daily home blood pressures over the last couple weeks has shown an average of 914 systolic and 70 diastolic.  Highest systolic was 782.  Highest diastolic 84. Heart rate average 85.  Past Medical History:  Diagnosis Date   Corneal dystrophies, hereditary    Diabetes mellitus 10/2010   A1c 6.7%    Diverticulosis    Dry eye syndrome    Hyperlipemia    Hypertension    Hypothyroidism    Lumbar spondylosis 05/2016   x-ray   Macular degeneration, bilateral    Osteoarthritis    Presbyopia    Transfusion history 1959   post partum    Past Surgical History:  Procedure Laterality Date   APPENDECTOMY  1959   CATARACT EXTRACTION, BILATERAL     Dr Bing Plume   CESAREAN SECTION  [phone]; 1959   COLECTOMY     18 inches removed 2002-2003 for Diverticulitis   COLONOSCOPY     Tics,Dr Sam Chenega--pt declines any further screening as of 12/2015.   DEXA  06/30/2016;10/13/19   2017 T score 2.5.  Rpt 2020 NORMAL.  01/2022 T score -1.2   VAGINAL HYSTERECTOMY  1975   For Fibroids (no BSO)    Outpatient Medications Prior to Visit  Medication Sig Dispense Refill   atorvastatin (LIPITOR) 40 MG tablet  Take 1 tablet (40 mg total) by mouth daily. 90 tablet 3   cycloSPORINE (RESTASIS) 0.05 % ophthalmic emulsion Place 1 drop into both eyes 2 (two) times daily.     glucose blood (ACCU-CHEK GUIDE) test strip Use to check blood sugar once daily. 200 each 3   hydrochlorothiazide (HYDRODIURIL) 25 MG tablet Take 1 tablet (25 mg total) by mouth daily. 90 tablet 1   levothyroxine (SYNTHROID) 50 MCG tablet TAKE 1 TABLET EVERY DAY EXCEPT TAKE 1/2 TABLET ON TUESDAY, THURSDAY, AND SATURDAY 72 tablet 0   metroNIDAZOLE (METROCREAM) 0.75 % cream Apply topically 2 (two) times daily. 45 g 1   pioglitazone (ACTOS) 15 MG tablet Take 1 tablet (15 mg total) by mouth daily. 90 tablet 1   ramipril (ALTACE) 5 MG capsule Take 1 capsule (5 mg total) by mouth daily. 90 capsule 3   No facility-administered medications prior to visit.    Allergies  Allergen Reactions   Doxycycline Hyclate Diarrhea   Penicillins     Rash Because of a history of documented adverse serious drug reaction;Medi Alert bracelet  is recommended   Tetanus Toxoid     REACTION: Arm swelling-severe Because of a history of documented adverse serious drug reaction;Medi Alert bracelet  is recommended   Metformin And Related Nausea  And Vomiting and Other (See Comments)    Abdominal pain    Review of Systems As per HPI  PE:    11/22/2022   10:12 AM 11/08/2022    9:43 AM 11/08/2022    9:31 AM  Vitals with BMI  Height '5\' 6"'$   '5\' 6"'$   Weight 167 lbs 10 oz  166 lbs 3 oz  BMI 57.90  38.33  Systolic 383 291 916  Diastolic 72 99 84  Pulse 74 82 84     Physical Exam  Gen: Alert, well appearing.  Patient is oriented to person, place, time, and situation. AFFECT: pleasant, lucid thought and speech. CV: RRR, no m/r/g.   LUNGS: CTA bilat, nonlabored resps, good aeration in all lung fields. EXT: no clubbing or cyanosis.  no edema.    LABS:  Last CBC Lab Results  Component Value Date   WBC 5.8 01/18/2022   HGB 14.4 01/18/2022   HCT 43.5  01/18/2022   MCV 91.8 01/18/2022   RDW 13.4 01/18/2022   PLT 216.0 60/60/0459   Last metabolic panel Lab Results  Component Value Date   GLUCOSE 159 (H) 08/06/2022   NA 141 08/06/2022   K 4.1 08/06/2022   CL 102 08/06/2022   CO2 33 (H) 08/06/2022   BUN 21 08/06/2022   CREATININE 0.72 08/06/2022   CALCIUM 9.1 08/06/2022   PROT 6.2 08/06/2022   ALBUMIN 3.7 08/06/2022   BILITOT 0.6 08/06/2022   ALKPHOS 72 08/06/2022   AST 18 08/06/2022   ALT 20 08/06/2022   Last lipids Lab Results  Component Value Date   CHOL 156 01/18/2022   HDL 68.00 01/18/2022   LDLCALC 74 01/18/2022   TRIG 70.0 01/18/2022   CHOLHDL 2 01/18/2022   Last hemoglobin A1c Lab Results  Component Value Date   HGBA1C 7.4 (A) 11/08/2022   HGBA1C 7.4 11/08/2022   HGBA1C 7.4 (A) 11/08/2022   HGBA1C 7.4 (A) 11/08/2022   Last thyroid functions Lab Results  Component Value Date   TSH 0.42 08/06/2022   IMPRESSION AND PLAN:  Hypertension, well-controlled on HCTZ 25 mg a day and ramipril 5 mg a day. No changes today.  An After Visit Summary was printed and given to the patient.  FOLLOW UP: Return in about 3 months (around 02/21/2023) for annual CPE (fasting).  Signed:  Crissie Sickles, MD           11/22/2022

## 2022-12-24 ENCOUNTER — Encounter: Payer: Self-pay | Admitting: Family Medicine

## 2022-12-24 ENCOUNTER — Ambulatory Visit (INDEPENDENT_AMBULATORY_CARE_PROVIDER_SITE_OTHER): Payer: Medicare Other | Admitting: Family Medicine

## 2022-12-24 VITALS — BP 115/69 | HR 81 | Temp 97.7°F | Ht 66.0 in | Wt 169.6 lb

## 2022-12-24 DIAGNOSIS — M79672 Pain in left foot: Secondary | ICD-10-CM

## 2022-12-24 DIAGNOSIS — E119 Type 2 diabetes mellitus without complications: Secondary | ICD-10-CM | POA: Diagnosis not present

## 2022-12-24 DIAGNOSIS — M79671 Pain in right foot: Secondary | ICD-10-CM | POA: Diagnosis not present

## 2022-12-24 MED ORDER — ACCU-CHEK FASTCLIX LANCETS MISC: 2 refills | Status: DC

## 2022-12-24 MED ORDER — ACCU-CHEK GUIDE VI STRP: ORAL_STRIP | 3 refills | Status: DC

## 2022-12-24 NOTE — Progress Notes (Signed)
OFFICE VISIT  12/24/2022  CC:  Chief Complaint  Patient presents with   Feet and Ankle Concern    Feet and ankle ache for the past 3 weeks mainly at night. She uses aspercreme but not helpful.   Patient is a 89 y.o. female who presents for pain in feet and ankles.  HPI: About 3 weeks history of pain in both feet around the metatarsal head regions of all toes.  A little bit extending into the toes themselves.  Pain only with weightbearing.  Not severe.  No swelling of the feet. Having some cramps in the calves more lately. No preceding overuse or injury. The pain is achy, not burning.  No tingling or numbness.  Her toes do feel cold most the time.  No color changes of the feet.  She does have some paleness and pink of the toes intermittently.  Past Medical History:  Diagnosis Date   Corneal dystrophies, hereditary    Diabetes mellitus 10/2010   A1c 6.7%    Diverticulosis    Dry eye syndrome    Hyperlipemia    Hypertension    Hypothyroidism    Lumbar spondylosis 05/2016   x-ray   Macular degeneration, bilateral    Osteoarthritis    Presbyopia    Transfusion history 1959   post partum    Past Surgical History:  Procedure Laterality Date   APPENDECTOMY  1959   CATARACT EXTRACTION, BILATERAL     Dr Bing Plume   CESAREAN SECTION  [phone]; 1959   COLECTOMY     18 inches removed 2002-2003 for Diverticulitis   COLONOSCOPY     Tics,Dr Sam Cameron--pt declines any further screening as of 12/2015.   DEXA  06/30/2016;10/13/19   2017 T score 2.5.  Rpt 2020 NORMAL.  01/2022 T score -1.2   VAGINAL HYSTERECTOMY  1975   For Fibroids (no BSO)    Outpatient Medications Prior to Visit  Medication Sig Dispense Refill   atorvastatin (LIPITOR) 40 MG tablet Take 1 tablet (40 mg total) by mouth daily. 90 tablet 3   cycloSPORINE (RESTASIS) 0.05 % ophthalmic emulsion Place 1 drop into both eyes 2 (two) times daily.     hydrochlorothiazide (HYDRODIURIL) 25 MG tablet Take 1 tablet (25 mg total) by  mouth daily. 90 tablet 1   levothyroxine (SYNTHROID) 50 MCG tablet TAKE 1 TABLET EVERY DAY EXCEPT TAKE 1/2 TABLET ON TUESDAY, THURSDAY, AND SATURDAY 72 tablet 0   metroNIDAZOLE (METROCREAM) 0.75 % cream Apply topically 2 (two) times daily. 45 g 1   pioglitazone (ACTOS) 15 MG tablet Take 1 tablet (15 mg total) by mouth daily. 90 tablet 1   ramipril (ALTACE) 5 MG capsule Take 1 capsule (5 mg total) by mouth daily. 90 capsule 3   glucose blood (ACCU-CHEK GUIDE) test strip Use to check blood sugar once daily. 200 each 3   No facility-administered medications prior to visit.    Allergies  Allergen Reactions   Doxycycline Hyclate Diarrhea   Penicillins     Rash Because of a history of documented adverse serious drug reaction;Medi Alert bracelet  is recommended   Tetanus Toxoid     REACTION: Arm swelling-severe Because of a history of documented adverse serious drug reaction;Medi Alert bracelet  is recommended   Metformin And Related Nausea And Vomiting and Other (See Comments)    Abdominal pain    Review of Systems  As per HPI  PE:    12/24/2022   10:39 AM 11/22/2022   10:12 AM  11/08/2022    9:43 AM  Vitals with BMI  Height 5' 6"$  5' 6"$    Weight 169 lbs 10 oz 167 lbs 10 oz   BMI [id number]   Systolic [id number]  Diastolic 69 72 99  Pulse 81 74 82     Physical Exam  Gen: Alert, well appearing.  Patient is oriented to person, place, time, and situation. AFFECT: pleasant, lucid thought and speech. No lower extremity swelling or edema.  No erythema or tenderness.  Dorsalis pedis and posterior tibial pulses 2+ bilaterally.  She has mild hammertoe deformities on the right greater than left feet.  No significant hallux valgus deformity.  No tenderness on the feet or toes.  Skin is pink, all warm to touch.  Range of motion of the feet and ankles intact.  Minimal loss of arches.  LABS:  None  IMPRESSION AND PLAN:  Distal foot pain.  Metatarsalgia most likely. Discussed metatarsal  pad inserts for both feet. Tylenol 500 mg to 1000 mg every 6 hours as needed.  Try to minimize time standing still but I would like her to keep walking is much as she usually does. No imaging indicated at this time.  An After Visit Summary was printed and given to the patient.  FOLLOW UP: Return for keep appt set for April.  Signed:  Crissie Sickles, MD           12/24/2022

## 2022-12-26 DIAGNOSIS — E1165 Type 2 diabetes mellitus with hyperglycemia: Secondary | ICD-10-CM | POA: Diagnosis not present

## 2022-12-26 DIAGNOSIS — H04123 Dry eye syndrome of bilateral lacrimal glands: Secondary | ICD-10-CM | POA: Diagnosis not present

## 2022-12-26 DIAGNOSIS — H18593 Other hereditary corneal dystrophies, bilateral: Secondary | ICD-10-CM | POA: Diagnosis not present

## 2022-12-26 DIAGNOSIS — H353132 Nonexudative age-related macular degeneration, bilateral, intermediate dry stage: Secondary | ICD-10-CM | POA: Diagnosis not present

## 2023-01-03 DIAGNOSIS — K080 Exfoliation of teeth due to systemic causes: Secondary | ICD-10-CM | POA: Diagnosis not present

## 2023-01-31 ENCOUNTER — Ambulatory Visit
Admission: RE | Admit: 2023-01-31 | Discharge: 2023-01-31 | Disposition: A | Discharge status: Home / Self Care | Payer: Medicare Other | Source: Ambulatory Visit | Attending: Family Medicine | Admitting: Family Medicine

## 2023-01-31 DIAGNOSIS — Z1231 Encounter for screening mammogram for malignant neoplasm of breast: Secondary | ICD-10-CM

## 2023-02-04 ENCOUNTER — Telehealth: Payer: Self-pay

## 2023-02-04 MED ORDER — RAMIPRIL 5 MG PO CAPS: 5.0000 mg | ORAL_CAPSULE | Freq: Every day | ORAL | 1 refills | Status: DC

## 2023-02-04 NOTE — Telephone Encounter (Signed)
Pt advised refill sent to pharmacy. 

## 2023-02-04 NOTE — Telephone Encounter (Signed)
Patient refill request.  Prescription has expired.  [facility] [address]  ramipril (ALTACE) 5 MG capsule

## 2023-02-19 NOTE — Patient Instructions (Signed)

## 2023-02-21 ENCOUNTER — Ambulatory Visit (INDEPENDENT_AMBULATORY_CARE_PROVIDER_SITE_OTHER): Payer: Medicare Other | Admitting: Family Medicine

## 2023-02-21 ENCOUNTER — Encounter: Payer: Self-pay | Admitting: Family Medicine

## 2023-02-21 VITALS — BP 108/67 | HR 70 | Ht 66.0 in | Wt 176.9 lb

## 2023-02-21 DIAGNOSIS — E7800 Pure hypercholesterolemia, unspecified: Secondary | ICD-10-CM | POA: Diagnosis not present

## 2023-02-21 DIAGNOSIS — Z7984 Long term (current) use of oral hypoglycemic drugs: Secondary | ICD-10-CM

## 2023-02-21 DIAGNOSIS — I10 Essential (primary) hypertension: Secondary | ICD-10-CM

## 2023-02-21 DIAGNOSIS — E119 Type 2 diabetes mellitus without complications: Secondary | ICD-10-CM

## 2023-02-21 DIAGNOSIS — Z0000 Encounter for general adult medical examination without abnormal findings: Secondary | ICD-10-CM

## 2023-02-21 DIAGNOSIS — E039 Hypothyroidism, unspecified: Secondary | ICD-10-CM | POA: Diagnosis not present

## 2023-02-21 LAB — COMPREHENSIVE METABOLIC PANEL
ALT: 17 U/L (ref 0–35)
AST: 18 U/L (ref 0–37)
Albumin: 3.7 g/dL (ref 3.5–5.2)
Alkaline Phosphatase: 62 U/L (ref 39–117)
BUN: 24 mg/dL — ABNORMAL HIGH (ref 6–23)
CO2: 33 mEq/L — ABNORMAL HIGH (ref 19–32)
Calcium: 9.1 mg/dL (ref 8.4–10.5)
Chloride: 100 mEq/L (ref 96–112)
Creatinine, Ser: 0.79 mg/dL (ref 0.40–1.20)
GFR: 67.55 mL/min (ref >60.00)
Glucose, Bld: 121 mg/dL — ABNORMAL HIGH (ref 70–99)
Potassium: 4 mEq/L (ref 3.5–5.1)
Sodium: 141 mEq/L (ref 135–145)
Total Bilirubin: 0.7 mg/dL (ref 0.2–1.2)
Total Protein: 6.1 g/dL (ref 6.0–8.3)

## 2023-02-21 LAB — LIPID PANEL
Cholesterol: 147 mg/dL (ref 0–200)
HDL: 56.3 mg/dL (ref >39.00)
LDL Cholesterol: 71 mg/dL (ref 0–99)
NonHDL: 90.33
Total CHOL/HDL Ratio: 3
Triglycerides: 96 mg/dL (ref 0.0–149.0)
VLDL: 19.2 mg/dL (ref 0.0–40.0)

## 2023-02-21 LAB — CBC
HCT: 42 % (ref 36.0–46.0)
Hemoglobin: 13.8 g/dL (ref 12.0–15.0)
MCHC: 33 g/dL (ref 30.0–36.0)
MCV: 93 fl (ref 78.0–100.0)
Platelets: 199 10*3/uL (ref 150.0–400.0)
RBC: 4.51 Mil/uL (ref 3.87–5.11)
RDW: 13.4 % (ref 11.5–15.5)
WBC: 5.7 10*3/uL (ref 4.0–10.5)

## 2023-02-21 LAB — POCT GLYCOSYLATED HEMOGLOBIN (HGB A1C)
HbA1c POC (<> result, manual entry): 6.8 % (ref 4.0–5.6)
HbA1c, POC (controlled diabetic range): 6.8 % (ref 0.0–7.0)
HbA1c, POC (prediabetic range): 6.8 % — AB (ref 5.7–6.4)
Hemoglobin A1C: 6.8 % — AB (ref 4.0–5.6)

## 2023-02-21 LAB — TSH: TSH: 0.39 u[IU]/mL (ref 0.35–5.50)

## 2023-02-21 MED ORDER — ATORVASTATIN CALCIUM 40 MG PO TABS: 40.0000 mg | ORAL_TABLET | Freq: Every day | ORAL | 3 refills | Status: DC

## 2023-02-21 MED ORDER — METRONIDAZOLE 0.75 % EX CREA: TOPICAL_CREAM | Freq: Two times a day (BID) | CUTANEOUS | 6 refills | Status: AC

## 2023-02-21 MED ORDER — LEVOTHYROXINE SODIUM 50 MCG PO TABS: ORAL_TABLET | ORAL | 3 refills | Status: DC

## 2023-02-21 MED ORDER — ACCU-CHEK FASTCLIX LANCETS MISC: 2 refills | Status: DC

## 2023-02-21 NOTE — Progress Notes (Signed)
Office Note 02/21/2023  CC:  Chief Complaint  Patient presents with   Annual Exam    Fasting CPE.    HPI:  Patient is a 89 y.o. female who is here for annual health maintenance exam and 69-month follow-up diabetes, hypertension, and hypothyroidism.  Shannon Greene is doing great. Eats healthy, is active and has no acute concerns. Compliant with medications. She is a bit miffed that she has gained 9 pounds since I last saw her!  The 1 year anniversary of her husband's death is coming up but she seems to be handling it very well.  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   DEXA  06/30/2016;10/13/19   2017 T score 2.5.  Rpt 2020 NORMAL.  01/2022 T score -1.2   VAGINAL HYSTERECTOMY  1975   For Fibroids (no BSO)    Family History  Problem Relation Age of Onset   Heart attack Mother 42   Hypertension Mother    Tuberculosis Father    Diabetes Brother    Stroke Brother 54   Prostate cancer Brother    Breast cancer Neg Hx     Social History   Socioeconomic History   Marital status: Widowed    Spouse name: Not on file   Number of children: Not on file   Years of education: Not on file   Highest education level: Not on file  Occupational History   Not on file  Tobacco Use   Smoking status: Never   Smokeless tobacco: Never  Vaping Use   Vaping Use: Never used   Substance and Sexual Activity   Alcohol use: No   Drug use: No   Sexual activity: Not Currently  Other Topics Concern   Not on file  Social History Narrative   Widow as of 02/2022, 2 daughters.   Occupation: retired Scientist, forensic).   Silver sneakers.   No T/A/Ds.   Social Determinants of Health   Financial Resource Strain: Low Risk  (10/03/2022)   Overall Financial Resource Strain (CARDIA)    Difficulty of Paying Living Expenses: Not hard at all  Food Insecurity: No Food Insecurity (10/03/2022)   Hunger Vital Sign    Worried About Running Out of Food in the Last Year: Never true    Ran Out of Food in the Last Year: Never true  Transportation Needs: No Transportation Needs (10/03/2022)   PRAPARE - Administrator, Civil Service (Medical): No    Lack of Transportation (Non-Medical): No  Physical Activity: Inactive (10/03/2022)   Exercise Vital Sign    Days of Exercise per Week: 0 days    Minutes of Exercise per Session: 0 min  Stress: No Stress Concern Present (10/03/2022)   [employer] of Occupational  Health - Occupational Stress Questionnaire    Feeling of Stress : Not at all  Social Connections: Moderately Integrated (10/03/2022)   Social Connection and Isolation Panel [NHANES]    Frequency of Communication with Friends and Family: Three times a week    Frequency of Social Gatherings with Friends and Family: More than three times a week    Attends Religious Services: More than 4 times per year    Active Member of Clubs or Organizations: Yes    Attends Banker Meetings: 1 to 4 times per year    Marital Status: Widowed  Intimate Partner Violence: Not At Risk (10/03/2022)   Humiliation, Afraid, Rape, and Kick questionnaire    Fear of Current or Ex-Partner: No    Emotionally Abused: No    Physically Abused: No    Sexually Abused: No    Outpatient Medications Prior to Visit  Medication Sig Dispense Refill   cycloSPORINE  (RESTASIS) 0.05 % ophthalmic emulsion Place 1 drop into both eyes 2 (two) times daily.     glucose blood (ACCU-CHEK GUIDE) test strip Use to check blood sugar once daily. 200 each 3   hydrochlorothiazide (HYDRODIURIL) 25 MG tablet Take 1 tablet (25 mg total) by mouth daily. 90 tablet 1   pioglitazone (ACTOS) 15 MG tablet Take 1 tablet (15 mg total) by mouth daily. 90 tablet 1   ramipril (ALTACE) 5 MG capsule Take 1 capsule (5 mg total) by mouth daily. 90 capsule 1   Accu-Chek FastClix Lancets MISC Use to check glucose 1-2 times daily. 102 each 2   atorvastatin (LIPITOR) 40 MG tablet Take 1 tablet (40 mg total) by mouth daily. 90 tablet 3   levothyroxine (SYNTHROID) 50 MCG tablet TAKE 1 TABLET EVERY DAY EXCEPT TAKE 1/2 TABLET ON TUESDAY, THURSDAY, AND SATURDAY 72 tablet 0   metroNIDAZOLE (METROCREAM) 0.75 % cream Apply topically 2 (two) times daily. 45 g 1   No facility-administered medications prior to visit.    Allergies  Allergen Reactions   Doxycycline Hyclate Diarrhea   Penicillins     Rash Because of a history of documented adverse serious drug reaction;Medi Alert bracelet  is recommended   Tetanus Toxoid     REACTION: Arm swelling-severe Because of a history of documented adverse serious drug reaction;Medi Alert bracelet  is recommended   Metformin And Related Nausea And Vomiting and Other (See Comments)    Abdominal pain    Review of Systems  Constitutional:  Negative for appetite change, chills, fatigue and fever.  HENT:  Negative for congestion, dental problem, ear pain and sore throat.   Eyes:  Negative for discharge, redness and visual disturbance.  Respiratory:  Negative for cough, chest tightness, shortness of breath and wheezing.   Cardiovascular:  Negative for chest pain, palpitations and leg swelling.  Gastrointestinal:  Negative for abdominal pain, blood in stool, diarrhea, nausea and vomiting.  Genitourinary:  Negative for difficulty urinating, dysuria, flank pain,  frequency, hematuria and urgency.  Musculoskeletal:  Negative for arthralgias, back pain, joint swelling, myalgias and neck stiffness.  Skin:  Negative for pallor and rash.  Neurological:  Negative for dizziness, speech difficulty, weakness and headaches.  Hematological:  Negative for adenopathy. Does not bruise/bleed easily.  Psychiatric/Behavioral:  Negative for confusion and sleep disturbance. The patient is not nervous/anxious.     PE;    02/21/2023    8:45 AM 12/24/2022   10:39 AM 11/22/2022   10:12 AM  Vitals with BMI  Height 5\' 6"  5'  6"   Weight 176 lbs 14 oz 169 lbs 10 oz 167 lbs 10 oz  BMI 28.57 27.39 27.06  Systolic 108 115 161  Diastolic 67 69 72  Pulse 70 81 74  Exam chaperoned by Sammuel Cooper, CMA   Gen: Alert, well appearing.  Patient is oriented to person, place, time, and situation. AFFECT: pleasant, lucid thought and speech. ENT: Ears: EACs clear, normal epithelium.  TMs with good light reflex and landmarks bilaterally.  Eyes: no injection, icteris, swelling, or exudate.  EOMI, PERRLA. Nose: no drainage or turbinate edema/swelling.  No injection or focal lesion.  Mouth: lips without lesion/swelling.  Oral mucosa pink and moist.  Dentition intact and without obvious caries or gingival swelling.  Oropharynx without erythema, exudate, or swelling.  Neck: supple/nontender.  No LAD, mass, or TM.  Carotid pulses 2+ bilaterally, without bruits. CV: RRR, no m/r/g.   LUNGS: CTA bilat, nonlabored resps, good aeration in all lung fields. ABD: soft, NT, ND, BS normal.  No hepatospenomegaly or mass.  No bruits. EXT: no clubbing, cyanosis, or edema.  Musculoskeletal: no joint swelling, erythema, warmth, or tenderness.  ROM of all joints intact. Skin - no sores or suspicious lesions or rashes or color changes  Pertinent labs:  Lab Results  Component Value Date   TSH 0.42 08/06/2022   Lab Results  Component Value Date   WBC 5.8 01/18/2022   HGB 14.4 01/18/2022   HCT  43.5 01/18/2022   MCV 91.8 01/18/2022   PLT 216.0 01/18/2022   Lab Results  Component Value Date   CREATININE 0.72 08/06/2022   BUN 21 08/06/2022   NA 141 08/06/2022   K 4.1 08/06/2022   CL 102 08/06/2022   CO2 33 (H) 08/06/2022   Lab Results  Component Value Date   ALT 20 08/06/2022   AST 18 08/06/2022   ALKPHOS 72 08/06/2022   BILITOT 0.6 08/06/2022   Lab Results  Component Value Date   CHOL 156 01/18/2022   Lab Results  Component Value Date   HDL 68.00 01/18/2022   Lab Results  Component Value Date   LDLCALC 74 01/18/2022   Lab Results  Component Value Date   TRIG 70.0 01/18/2022   Lab Results  Component Value Date   CHOLHDL 2 01/18/2022   Lab Results  Component Value Date   HGBA1C 6.8 (A) 02/21/2023   HGBA1C 6.8 02/21/2023   HGBA1C 6.8 (A) 02/21/2023   HGBA1C 6.8 02/21/2023   ASSESSMENT AND PLAN:   #1 health maintenance exam: Reviewed age and gender appropriate health maintenance issues (prudent diet, regular exercise, health risks of tobacco and excessive alcohol, use of seatbelts, fire alarms in home, use of sunscreen).  Also reviewed age and gender appropriate health screening as well as vaccine recommendations. Vaccines: All up-to-date. Labs: Health panel, hemoglobin A1c. Cervical ca screening: No further screening needed. Breast ca screening: Screening mammogram normal last month.  Repeat 1 year.   Colon ca screening: no further screening indicated d/t age. Osteoporosis screening: Next DEXA due in 2025.  #2 diabetes without complication.  Good control. POC Hba1c today is 6.8%. Continue pioglitazone 15 mg a day.  3.  Hypertension, well-controlled on HCTZ 25 mg a day and ramipril 5 mg a day. Electrolytes and creatinine today.  4.  Hypothyroidism, has been stable on 50 mcg tab, 1 daily except half tab on Tuesday Thursday and Saturday. TSH today.  An After Visit Summary was printed and given to the patient.  FOLLOW UP:  Return in about 3  months (around 05/23/2023) for routine chronic illness f/u.  Signed:  Santiago Bumpers, MD           02/21/2023

## 2023-03-25 ENCOUNTER — Other Ambulatory Visit: Payer: Self-pay

## 2023-03-25 ENCOUNTER — Telehealth: Payer: Self-pay

## 2023-03-25 DIAGNOSIS — E119 Type 2 diabetes mellitus without complications: Secondary | ICD-10-CM

## 2023-03-25 MED ORDER — ACCU-CHEK GUIDE VI STRP
ORAL_STRIP | 3 refills | Status: DC
Start: 2023-03-25 — End: 2023-03-27

## 2023-03-25 MED ORDER — LEVOTHYROXINE SODIUM 50 MCG PO TABS: ORAL_TABLET | ORAL | 2 refills | Status: DC

## 2023-03-25 NOTE — Telephone Encounter (Signed)
Patient refill request. [facility] [address]  glucose blood (ACCU-CHEK GUIDE) test strip   levothyroxine (SYNTHROID) 50 MCG tablet

## 2023-03-25 NOTE — Telephone Encounter (Signed)
Refill sent to requested pharmacy.

## 2023-03-27 ENCOUNTER — Other Ambulatory Visit: Payer: Self-pay

## 2023-03-27 MED ORDER — BLOOD GLUCOSE MONITOR KIT: PACK | 0 refills | Status: AC

## 2023-05-13 ENCOUNTER — Telehealth: Payer: Self-pay | Admitting: Family Medicine

## 2023-05-13 MED ORDER — PIOGLITAZONE HCL 15 MG PO TABS: 15.0000 mg | ORAL_TABLET | Freq: Every day | ORAL | 1 refills | Status: DC

## 2023-05-13 NOTE — Telephone Encounter (Signed)
Patient would like to know if she needs to continue this medication pioglitazone (ACTOS) 15 MG tablet . She reports that she does not have any refills and if she is to continue it she will need a refill called into to her pharmacy.

## 2023-05-13 NOTE — Telephone Encounter (Signed)
Refill sent and pt aware  

## 2023-05-20 NOTE — Patient Instructions (Signed)
It was very nice to see you today!   PLEASE NOTE:   If you had any lab tests please let us know if you have not heard back within a few days. You may see your results on MyChart before we have a chance to review them but we will give you a call once they are reviewed by us. If we ordered any referrals today, please let us know if you have not heard from their office within the next 2 weeks. You should receive a letter via MyChart confirming if the referral was approved and their office contact information to schedule.  

## 2023-05-23 ENCOUNTER — Encounter: Payer: Self-pay | Admitting: Family Medicine

## 2023-05-23 ENCOUNTER — Ambulatory Visit (INDEPENDENT_AMBULATORY_CARE_PROVIDER_SITE_OTHER): Payer: Medicare Other | Admitting: Family Medicine

## 2023-05-23 VITALS — BP 125/78 | HR 68 | Wt 179.2 lb

## 2023-05-23 DIAGNOSIS — R635 Abnormal weight gain: Secondary | ICD-10-CM

## 2023-05-23 DIAGNOSIS — Z7984 Long term (current) use of oral hypoglycemic drugs: Secondary | ICD-10-CM

## 2023-05-23 DIAGNOSIS — E7800 Pure hypercholesterolemia, unspecified: Secondary | ICD-10-CM

## 2023-05-23 DIAGNOSIS — I10 Essential (primary) hypertension: Secondary | ICD-10-CM | POA: Diagnosis not present

## 2023-05-23 DIAGNOSIS — E039 Hypothyroidism, unspecified: Secondary | ICD-10-CM

## 2023-05-23 DIAGNOSIS — E119 Type 2 diabetes mellitus without complications: Secondary | ICD-10-CM | POA: Diagnosis not present

## 2023-05-23 DIAGNOSIS — T50905A Adverse effect of unspecified drugs, medicaments and biological substances, initial encounter: Secondary | ICD-10-CM

## 2023-05-23 LAB — BASIC METABOLIC PANEL
BUN: 19 mg/dL (ref 6–23)
CO2: 34 mEq/L — ABNORMAL HIGH (ref 19–32)
Calcium: 9.5 mg/dL (ref 8.4–10.5)
Chloride: 99 mEq/L (ref 96–112)
Creatinine, Ser: 0.78 mg/dL (ref 0.40–1.20)
GFR: 68.48 mL/min (ref >60.00)
Glucose, Bld: 132 mg/dL — ABNORMAL HIGH (ref 70–99)
Potassium: 4.1 mEq/L (ref 3.5–5.1)
Sodium: 140 mEq/L (ref 135–145)

## 2023-05-23 LAB — POCT GLYCOSYLATED HEMOGLOBIN (HGB A1C)
HbA1c POC (<> result, manual entry): 7.2 % (ref 4.0–5.6)
HbA1c, POC (controlled diabetic range): 7.2 % — AB (ref 0.0–7.0)
HbA1c, POC (prediabetic range): 7.2 % — AB (ref 5.7–6.4)
Hemoglobin A1C: 7.2 % — AB (ref 4.0–5.6)

## 2023-05-23 MED ORDER — EMPAGLIFLOZIN 10 MG PO TABS
10.0000 mg | ORAL_TABLET | Freq: Every day | ORAL | 2 refills | Status: DC
Start: 2023-05-23 — End: 2023-08-16

## 2023-05-23 MED ORDER — HYDROCHLOROTHIAZIDE 25 MG PO TABS: 25.0000 mg | ORAL_TABLET | Freq: Every day | ORAL | 1 refills | Status: DC

## 2023-05-23 NOTE — Progress Notes (Signed)
OFFICE VISIT  05/23/2023  CC:  Chief Complaint  Patient presents with   Medical Management of Chronic Issues    Patient is a 89 y.o. female who presents for 57-month follow-up diabetes, hypertension, and hypothyroidism. A/P as of last visit: "1 diabetes without complication.  Good control. POC Hba1c today is 6.8%. Continue pioglitazone 15 mg a day.   2.  Hypertension, well-controlled on HCTZ 25 mg a day and ramipril 5 mg a day. Electrolytes and creatinine today.   3.  Hypothyroidism, has been stable on 50 mcg tab, 1 daily except half tab on Tuesday Thursday and Saturday. TSH today."  INTERIM HX:  She feels good.  She is concerned about her weight gain since being on pioglitazone.  She started this medication back in January this year and has gained 13 pounds since that time. Says diet has not changed.  Home blood pressures consistently 120s over 60s or 70s.  ROS as above, plus--> slight swelling in the right leg greater than left leg. No leg pain no swelling in the hands.  No fevers, no CP, no SOB, no wheezing, no cough, no dizziness, no HAs, no rashes, no melena/hematochezia.  No polyuria or polydipsia.  No myalgias or arthralgias.  No focal weakness, paresthesias, or tremors.  No acute vision or hearing abnormalities.  No dysuria or unusual/new urinary urgency or frequency.  No recent changes in lower legs. No n/v/d or abd pain.  No palpitations.    Past Medical History:  Diagnosis Date   Corneal dystrophies, hereditary    Diabetes mellitus 10/2010   A1c 6.7%    Diverticulosis    Dry eye syndrome    Hyperlipemia    Hypertension    Hypothyroidism    Lumbar spondylosis 05/2016   x-ray   Macular degeneration, bilateral    Osteoarthritis    Presbyopia    Transfusion history 1959   post partum    Past Surgical History:  Procedure Laterality Date   APPENDECTOMY  1959   CATARACT EXTRACTION, BILATERAL     Dr Hazle Quant   CESAREAN SECTION  [phone]; 1959   COLECTOMY     18  inches removed 2002-2003 for Diverticulitis   COLONOSCOPY     Tics,Dr Sam Anderson--pt declines any further screening as of 12/2015.   DEXA  06/30/2016;10/13/19   2017 T score 2.5.  Rpt 2020 NORMAL.  01/2022 T score -1.2   VAGINAL HYSTERECTOMY  1975   For Fibroids (no BSO)    Outpatient Medications Prior to Visit  Medication Sig Dispense Refill   atorvastatin (LIPITOR) 40 MG tablet Take 1 tablet (40 mg total) by mouth daily. 90 tablet 3   blood glucose meter kit and supplies KIT Use to check blood sugar once daily. 1 each 0   cycloSPORINE (RESTASIS) 0.05 % ophthalmic emulsion Place 1 drop into both eyes 2 (two) times daily.     Lancets (ONETOUCH DELICA PLUS LANCET33G) MISC Apply topically daily. Use to check blood sugar once daily.     levothyroxine (SYNTHROID) 50 MCG tablet TAKE 1 TABLET EVERY DAY EXCEPT TAKE 1/2 TABLET ON TUESDAY, THURSDAY, AND SATURDAY 72 tablet 2   metroNIDAZOLE (METROCREAM) 0.75 % cream Apply topically 2 (two) times daily. 45 g 6   ONETOUCH VERIO test strip Use to check blood sugar once daily.     ramipril (ALTACE) 5 MG capsule Take 1 capsule (5 mg total) by mouth daily. 90 capsule 1   pioglitazone (ACTOS) 15 MG tablet Take 1 tablet (15 mg  total) by mouth daily. 90 tablet 1   hydrochlorothiazide (HYDRODIURIL) 25 MG tablet Take 1 tablet (25 mg total) by mouth daily. 90 tablet 1   No facility-administered medications prior to visit.    Allergies  Allergen Reactions   Doxycycline Hyclate Diarrhea   Penicillins     Rash Because of a history of documented adverse serious drug reaction;Medi Alert bracelet  is recommended   Tetanus Toxoid     REACTION: Arm swelling-severe Because of a history of documented adverse serious drug reaction;Medi Alert bracelet  is recommended   Metformin And Related Nausea And Vomiting and Other (See Comments)    Abdominal pain    Review of Systems As per HPI  PE:    05/23/2023    9:43 AM 02/21/2023    8:45 AM 12/24/2022   10:39 AM   Vitals with BMI  Height  5\' 6"  5\' 6"   Weight 179 lbs 3 oz 176 lbs 14 oz 169 lbs 10 oz  BMI  28.57 27.39  Systolic 125 108 413  Diastolic 78 67 69  Pulse 68 70 81     Physical Exam  Gen: Alert, well appearing.  Patient is oriented to person, place, time, and situation. CV: RRR, no m/r/g.   LUNGS: CTA bilat, nonlabored resps, good aeration in all lung fields. EXT: 1+ pitting R leg, trace pitting L leg  LABS:  Last CBC Lab Results  Component Value Date   WBC 5.7 02/21/2023   HGB 13.8 02/21/2023   HCT 42.0 02/21/2023   MCV 93.0 02/21/2023   RDW 13.4 02/21/2023   PLT 199.0 02/21/2023   Last metabolic panel Lab Results  Component Value Date   GLUCOSE 121 (H) 02/21/2023   NA 141 02/21/2023   K 4.0 02/21/2023   CL 100 02/21/2023   CO2 33 (H) 02/21/2023   BUN 24 (H) 02/21/2023   CREATININE 0.79 02/21/2023   GFR 67.55 02/21/2023   CALCIUM 9.1 02/21/2023   PROT 6.1 02/21/2023   ALBUMIN 3.7 02/21/2023   BILITOT 0.7 02/21/2023   ALKPHOS 62 02/21/2023   AST 18 02/21/2023   ALT 17 02/21/2023   Last lipids Lab Results  Component Value Date   CHOL 147 02/21/2023   HDL 56.30 02/21/2023   LDLCALC 71 02/21/2023   TRIG 96.0 02/21/2023   CHOLHDL 3 02/21/2023   Last hemoglobin A1c Lab Results  Component Value Date   HGBA1C 6.8 (A) 02/21/2023   HGBA1C 6.8 02/21/2023   HGBA1C 6.8 (A) 02/21/2023   HGBA1C 6.8 02/21/2023   Last thyroid functions Lab Results  Component Value Date   TSH 0.39 02/21/2023    IMPRESSION AND PLAN:  #1 diabetes without complication. Control has worsened a little bit since last check.  Her hemoglobin A1c today is 7.2%. She does not like the weight gain she has seen since being on pioglitazone. We will discontinue this medication and try Jardiance 10 mg a day.  2.  Essential hypertension, well-controlled on HCTZ 25 mg a day and ramipril 5 mg a day.  #3 hypercholesterolemia.  Doing great on Lipitor 40 mg a day. Lipids excellent about 3  months ago. Plan recheck lipids in 3 months.  #4 hypothyroidism, has been stable on 50 mcg tab, 1 daily except half tab on Tuesday Thursday and Saturday. TSH good 3 mo ago. Plan recheck 9 mo.  An After Visit Summary was printed and given to the patient.  FOLLOW UP: Return in about 3 months (around 08/23/2023) for routine chronic  illness f/u. Next CPE 02/2024 Signed:  Santiago Bumpers, MD           05/23/2023

## 2023-07-03 DIAGNOSIS — E1165 Type 2 diabetes mellitus with hyperglycemia: Secondary | ICD-10-CM | POA: Diagnosis not present

## 2023-07-03 DIAGNOSIS — H18593 Other hereditary corneal dystrophies, bilateral: Secondary | ICD-10-CM | POA: Diagnosis not present

## 2023-07-03 DIAGNOSIS — H524 Presbyopia: Secondary | ICD-10-CM | POA: Diagnosis not present

## 2023-07-03 DIAGNOSIS — H52223 Regular astigmatism, bilateral: Secondary | ICD-10-CM | POA: Diagnosis not present

## 2023-07-03 DIAGNOSIS — H353132 Nonexudative age-related macular degeneration, bilateral, intermediate dry stage: Secondary | ICD-10-CM | POA: Diagnosis not present

## 2023-07-03 DIAGNOSIS — H04123 Dry eye syndrome of bilateral lacrimal glands: Secondary | ICD-10-CM | POA: Diagnosis not present

## 2023-07-03 LAB — HM DIABETES EYE EXAM

## 2023-08-02 ENCOUNTER — Ambulatory Visit (INDEPENDENT_AMBULATORY_CARE_PROVIDER_SITE_OTHER): Payer: Medicare Other | Admitting: Family Medicine

## 2023-08-02 ENCOUNTER — Encounter: Payer: Self-pay | Admitting: Family Medicine

## 2023-08-02 VITALS — BP 119/77 | HR 74 | Wt 171.2 lb

## 2023-08-02 DIAGNOSIS — W19XXXA Unspecified fall, initial encounter: Secondary | ICD-10-CM | POA: Diagnosis not present

## 2023-08-02 DIAGNOSIS — S0083XA Contusion of other part of head, initial encounter: Secondary | ICD-10-CM | POA: Diagnosis not present

## 2023-08-02 DIAGNOSIS — Z23 Encounter for immunization: Secondary | ICD-10-CM

## 2023-08-02 NOTE — Progress Notes (Signed)
OFFICE VISIT  08/02/2023  CC:  Chief Complaint  Patient presents with   Genia Hotter 1d ago in shower; Pt states she hit the back of her head when she fell. Iced the area and has been fine since. Wanted to get it checked it out.     Patient is a 89 y.o. female who presents for a fall.  HPI: 2 nights ago she was stepping into the shower and 1 foot slipped and she fell backwards and hit her head.  She did not lose consciousness.  She got right up and went on with her shower.  She does not have any pain on her head and feels no swollen area.  No dizziness, no nausea, no visual abnormalities, no balance problems no focal weakness, no paresthesias, no tremor. She has been eating and drinking normally.  No memory problems or speech problems. Essentially she feels well but was urged by her family to come get checked today.   Past Medical History:  Diagnosis Date   Corneal dystrophies, hereditary    Diabetes mellitus 10/2010   A1c 6.7%    Diverticulosis    Dry eye syndrome    Hyperlipemia    Hypertension    Hypothyroidism    Lumbar spondylosis 05/2016   x-ray   Macular degeneration, bilateral    Osteoarthritis    Presbyopia    Transfusion history 1959   post partum    Past Surgical History:  Procedure Laterality Date   APPENDECTOMY  1959   CATARACT EXTRACTION, BILATERAL     Dr Hazle Quant   CESAREAN SECTION  [phone]; 1959   COLECTOMY     18 inches removed 2002-2003 for Diverticulitis   COLONOSCOPY     Tics,Dr Sam --pt declines any further screening as of 12/2015.   DEXA  06/30/2016;10/13/19   2017 T score 2.5.  Rpt 2020 NORMAL.  01/2022 T score -1.2   VAGINAL HYSTERECTOMY  1975   For Fibroids (no BSO)    Outpatient Medications Prior to Visit  Medication Sig Dispense Refill   atorvastatin (LIPITOR) 40 MG tablet Take 1 tablet (40 mg total) by mouth daily. 90 tablet 3   blood glucose meter kit and supplies KIT Use to check blood sugar once daily. 1 each 0   cycloSPORINE  (RESTASIS) 0.05 % ophthalmic emulsion Place 1 drop into both eyes 2 (two) times daily.     empagliflozin (JARDIANCE) 10 MG TABS tablet Take 1 tablet (10 mg total) by mouth daily. 30 tablet 2   hydrochlorothiazide (HYDRODIURIL) 25 MG tablet Take 1 tablet (25 mg total) by mouth daily. 90 tablet 1   Lancets (ONETOUCH DELICA PLUS LANCET33G) MISC Apply topically daily. Use to check blood sugar once daily.     levothyroxine (SYNTHROID) 50 MCG tablet TAKE 1 TABLET EVERY DAY EXCEPT TAKE 1/2 TABLET ON TUESDAY, THURSDAY, AND SATURDAY 72 tablet 2   metroNIDAZOLE (METROCREAM) 0.75 % cream Apply topically 2 (two) times daily. 45 g 6   ONETOUCH VERIO test strip Use to check blood sugar once daily.     ramipril (ALTACE) 5 MG capsule Take 1 capsule (5 mg total) by mouth daily. 90 capsule 1   No facility-administered medications prior to visit.    Allergies  Allergen Reactions   Doxycycline Hyclate Diarrhea   Penicillins     Rash Because of a history of documented adverse serious drug reaction;Medi Alert bracelet  is recommended   Tetanus Toxoid     REACTION: Arm swelling-severe Because  of a history of documented adverse serious drug reaction;Medi Alert bracelet  is recommended   Metformin And Related Nausea And Vomiting and Other (See Comments)    Abdominal pain    Review of Systems  As per HPI  PE:    08/02/2023    2:51 PM 05/23/2023    9:43 AM 02/21/2023    8:45 AM  Vitals with BMI  Height   5\' 6"   Weight 171 lbs 3 oz 179 lbs 3 oz 176 lbs 14 oz  BMI   28.57  Systolic 119 125 409  Diastolic 77 78 67  Pulse 74 68 70     Physical Exam  Gen: Alert, well appearing.  Patient is oriented to person, place, time, and situation. AFFECT: pleasant, lucid thought and speech. Neuro: CN 2-12 intact bilaterally, strength 5/5 in proximal and distal upper extremities and lower extremities bilaterally.  No sensory deficits.  No tremor.  No ataxia.  Scalp without bruising or swelling.  No tenderness to  palpation on her skull.  LABS:  Last metabolic panel Lab Results  Component Value Date   GLUCOSE 132 (H) 05/23/2023   NA 140 05/23/2023   K 4.1 05/23/2023   CL 99 05/23/2023   CO2 34 (H) 05/23/2023   BUN 19 05/23/2023   CREATININE 0.78 05/23/2023   GFR 68.48 05/23/2023   CALCIUM 9.5 05/23/2023   PROT 6.1 02/21/2023   ALBUMIN 3.7 02/21/2023   BILITOT 0.7 02/21/2023   ALKPHOS 62 02/21/2023   AST 18 02/21/2023   ALT 17 02/21/2023   IMPRESSION AND PLAN:  Fall, hit back of head.  No injury sustained, fortunately. Reassured her. Signs/symptoms to call or return for were reviewed and pt expressed understanding.  An After Visit Summary was printed and given to the patient.  FOLLOW UP: No follow-ups on file.  Signed:  Santiago Bumpers, MD           08/02/2023

## 2023-08-07 ENCOUNTER — Other Ambulatory Visit: Payer: Self-pay | Admitting: Family Medicine

## 2023-08-16 ENCOUNTER — Other Ambulatory Visit: Payer: Self-pay | Admitting: Family Medicine

## 2023-08-26 ENCOUNTER — Ambulatory Visit: Payer: Medicare Other | Admitting: Family Medicine

## 2023-08-26 ENCOUNTER — Encounter: Payer: Self-pay | Admitting: Family Medicine

## 2023-08-26 VITALS — BP 141/81 | HR 73 | Wt 169.4 lb

## 2023-08-26 DIAGNOSIS — I10 Essential (primary) hypertension: Secondary | ICD-10-CM | POA: Diagnosis not present

## 2023-08-26 DIAGNOSIS — E119 Type 2 diabetes mellitus without complications: Secondary | ICD-10-CM

## 2023-08-26 DIAGNOSIS — E7800 Pure hypercholesterolemia, unspecified: Secondary | ICD-10-CM | POA: Diagnosis not present

## 2023-08-26 DIAGNOSIS — J069 Acute upper respiratory infection, unspecified: Secondary | ICD-10-CM

## 2023-08-26 DIAGNOSIS — E039 Hypothyroidism, unspecified: Secondary | ICD-10-CM

## 2023-08-26 LAB — BASIC METABOLIC PANEL
BUN: 24 mg/dL — ABNORMAL HIGH (ref 6–23)
CO2: 34 meq/L — ABNORMAL HIGH (ref 19–32)
Calcium: 9.8 mg/dL (ref 8.4–10.5)
Chloride: 98 meq/L (ref 96–112)
Creatinine, Ser: 0.78 mg/dL (ref 0.40–1.20)
GFR: 68.35 mL/min (ref >60.00)
Glucose, Bld: 108 mg/dL — ABNORMAL HIGH (ref 70–99)
Potassium: 3.8 meq/L (ref 3.5–5.1)
Sodium: 141 meq/L (ref 135–145)

## 2023-08-26 LAB — POCT GLYCOSYLATED HEMOGLOBIN (HGB A1C)
HbA1c POC (<> result, manual entry): 6.8 % (ref 4.0–5.6)
HbA1c, POC (controlled diabetic range): 6.8 % (ref 0.0–7.0)
HbA1c, POC (prediabetic range): 6.8 % — AB (ref 5.7–6.4)
Hemoglobin A1C: 6.8 % — AB (ref 4.0–5.6)

## 2023-08-26 MED ORDER — EMPAGLIFLOZIN 10 MG PO TABS: 10.0000 mg | ORAL_TABLET | Freq: Every day | ORAL | 1 refills | Status: DC

## 2023-08-26 NOTE — Progress Notes (Signed)
OFFICE VISIT  08/26/2023  CC:  Chief Complaint  Patient presents with   Medical Management of Chronic Issues    Pt mentions cold symptoms; head-cold, nasal congestion.     Patient is a 89 y.o. female who presents for 80-month follow-up diabetes, hypertension, and hypercholesterolemia. A/P as of last visit: "#1 diabetes without complication. Control has worsened a little bit since last check.  Her hemoglobin A1c today is 7.2%. She does not like the weight gain she has seen since being on pioglitazone. We will discontinue this medication and try Jardiance 10 mg a day.   2.  Essential hypertension, well-controlled on HCTZ 25 mg a day and ramipril 5 mg a day.   #3 hypercholesterolemia.  Doing great on Lipitor 40 mg a day. Lipids excellent about 3 months ago. Plan recheck lipids in 3 months.   #4 hypothyroidism, has been stable on 50 mcg tab, 1 daily except half tab on Tuesday Thursday and Saturday. TSH good 3 mo ago. Plan recheck 9 mo."  INTERIM HX: Shannon Greene is feeling well other than starting nasal congestion, postnasal drip, and cough about 1 week ago.  Tmax 99.7.  She does not feel any body aches, malaise, headaches, sore throat, or fatigue. She is taking some over-the-counter cough and cold medication. In the last day or 2 she has been improving.  Home blood pressures consistently less than 130/80.  ROS as above, plus--> no CP, no SOB, no wheezing,  no dizziness, no rashes, no melena/hematochezia.  No polyuria or polydipsia.  No focal weakness, paresthesias, or tremors.  No acute vision or hearing abnormalities.  No dysuria or unusual/new urinary urgency or frequency.  No recent changes in lower legs. No n/v/d or abd pain.  No palpitations.     Past Medical History:  Diagnosis Date   Corneal dystrophies, hereditary    Diabetes mellitus 10/2010   A1c 6.7%    Diverticulosis    Dry eye syndrome    Hyperlipemia    Hypertension    Hypothyroidism    Left anterior shoulder pain     chronic, suspect biceps tendon etiology   Lumbar spondylosis 05/2016   x-ray   Macular degeneration, bilateral    Osteoarthritis    Presbyopia    Transfusion history 1959   post partum    Past Surgical History:  Procedure Laterality Date   APPENDECTOMY  1959   CATARACT EXTRACTION, BILATERAL     Dr Hazle Quant   CESAREAN SECTION  [phone]; 1959   COLECTOMY     18 inches removed 2002-2003 for Diverticulitis   COLONOSCOPY     Tics,Dr Sam Volcano--pt declines any further screening as of 12/2015.   DEXA  06/30/2016;10/13/19   2017 T score 2.5.  Rpt 2020 NORMAL.  01/2022 T score -1.2   VAGINAL HYSTERECTOMY  1975   For Fibroids (no BSO)    Outpatient Medications Prior to Visit  Medication Sig Dispense Refill   atorvastatin (LIPITOR) 40 MG tablet Take 1 tablet (40 mg total) by mouth daily. 90 tablet 3   blood glucose meter kit and supplies KIT Use to check blood sugar once daily. 1 each 0   cycloSPORINE (RESTASIS) 0.05 % ophthalmic emulsion Place 1 drop into both eyes 2 (two) times daily.     hydrochlorothiazide (HYDRODIURIL) 25 MG tablet Take 1 tablet (25 mg total) by mouth daily. 90 tablet 1   Lancets (ONETOUCH DELICA PLUS LANCET33G) MISC Apply topically daily. Use to check blood sugar once daily.  levothyroxine (SYNTHROID) 50 MCG tablet TAKE 1 TABLET EVERY DAY EXCEPT TAKE 1/2 TABLET ON TUESDAY, THURSDAY, AND SATURDAY 72 tablet 2   metroNIDAZOLE (METROCREAM) 0.75 % cream Apply topically 2 (two) times daily. 45 g 6   ONETOUCH VERIO test strip Use to check blood sugar once daily.     ramipril (ALTACE) 5 MG capsule Take 1 capsule by mouth once daily 90 capsule 0   JARDIANCE 10 MG TABS tablet Take 1 tablet by mouth once daily 30 tablet 0   No facility-administered medications prior to visit.    Allergies  Allergen Reactions   Doxycycline Hyclate Diarrhea   Penicillins     Rash Because of a history of documented adverse serious drug reaction;Medi Alert bracelet  is recommended    Tetanus Toxoid     REACTION: Arm swelling-severe Because of a history of documented adverse serious drug reaction;Medi Alert bracelet  is recommended   Metformin And Related Nausea And Vomiting and Other (See Comments)    Abdominal pain    Review of Systems As per HPI  PE:    08/26/2023    9:40 AM 08/02/2023    2:51 PM 05/23/2023    9:43 AM  Vitals with BMI  Weight 169 lbs 6 oz 171 lbs 3 oz 179 lbs 3 oz  Systolic 141 119 098  Diastolic 81 77 78  Pulse 73 74 68     Physical Exam  VS: noted--normal. Gen: alert, NAD, NONTOXIC APPEARING. HEENT: eyes without injection, drainage, or swelling.  Ears: EACs clear, TMs with normal light reflex and landmarks.  Nose: Clear rhinorrhea, with some dried, crusty exudate adherent to mildly injected mucosa.  No purulent d/c.  No paranasal sinus TTP.  No facial swelling.  Throat and mouth without focal lesion.  No pharyngial swelling, erythema, or exudate.   Neck: supple, no LAD.   LUNGS: CTA bilat, nonlabored resps.   CV: RRR, no m/r/g. EXT: no c/c/e SKIN: no rash   LABS:  Last CBC Lab Results  Component Value Date   WBC 5.7 02/21/2023   HGB 13.8 02/21/2023   HCT 42.0 02/21/2023   MCV 93.0 02/21/2023   RDW 13.4 02/21/2023   PLT 199.0 02/21/2023   Last metabolic panel Lab Results  Component Value Date   GLUCOSE 132 (H) 05/23/2023   NA 140 05/23/2023   K 4.1 05/23/2023   CL 99 05/23/2023   CO2 34 (H) 05/23/2023   BUN 19 05/23/2023   CREATININE 0.78 05/23/2023   GFR 68.48 05/23/2023   CALCIUM 9.5 05/23/2023   PROT 6.1 02/21/2023   ALBUMIN 3.7 02/21/2023   BILITOT 0.7 02/21/2023   ALKPHOS 62 02/21/2023   AST 18 02/21/2023   ALT 17 02/21/2023   Last lipids Lab Results  Component Value Date   CHOL 147 02/21/2023   HDL 56.30 02/21/2023   LDLCALC 71 02/21/2023   TRIG 96.0 02/21/2023   CHOLHDL 3 02/21/2023   Last hemoglobin A1c Lab Results  Component Value Date   HGBA1C 7.2 (A) 05/23/2023   HGBA1C 7.2 05/23/2023    HGBA1C 7.2 (A) 05/23/2023   HGBA1C 7.2 (A) 05/23/2023   Last thyroid functions Lab Results  Component Value Date   TSH 0.39 02/21/2023   IMPRESSION AND PLAN:  #1 diabetes without complication, great control on Jardiance 10 mg a day. POC Hba1c today is 6.8%.  2.  Hypertension, well-controlled on HCTZ 25 mg a day and ramipril 5 mg a day. Electrolytes and creatinine monitoring today.  #3  hypercholesterolemia.  Doing great on Lipitor 40 mg a day. Lipids excellent about 6 months ago. Plan recheck lipids in 3-6 months.   4 hypothyroidism, has been stable on 50 mcg tab, 1 daily except half tab on Tuesday Thursday and Saturday. TSH good 9 mo ago. Plan recheck 3 months.  #5 viral URI with cough and congestion. Improving the last 1 to 2 days. Continue current symptomatic care. Signs/symptoms to call or return for were reviewed and pt expressed understanding.  An After Visit Summary was printed and given to the patient.  FOLLOW UP: Return in about 3 months (around 11/26/2023) for routine chronic illness f/u. Next CPE 02/2024 Signed:  Santiago Bumpers, MD           08/26/2023

## 2023-09-02 ENCOUNTER — Encounter: Payer: Self-pay | Admitting: Family Medicine

## 2023-09-02 ENCOUNTER — Ambulatory Visit: Payer: Medicare Other | Admitting: Family Medicine

## 2023-09-02 VITALS — BP 96/66 | HR 77 | Temp 98.3°F | Wt 169.8 lb

## 2023-09-02 DIAGNOSIS — J069 Acute upper respiratory infection, unspecified: Secondary | ICD-10-CM

## 2023-09-02 MED ORDER — LEVOFLOXACIN 500 MG PO TABS: 500.0000 mg | ORAL_TABLET | Freq: Every day | ORAL | 0 refills | Status: AC

## 2023-09-02 MED ORDER — PREDNISONE 20 MG PO TABS: ORAL_TABLET | ORAL | 0 refills | Status: DC

## 2023-09-02 MED ORDER — METHYLPREDNISOLONE ACETATE 40 MG/ML IJ SUSP
40.0000 mg | Freq: Once | INTRAMUSCULAR | Status: AC
  Administered 2023-09-02: 40 mg via INTRAMUSCULAR

## 2023-09-02 NOTE — Progress Notes (Signed)
OFFICE VISIT  09/02/2023  CC:  Chief Complaint  Patient presents with   Nasal Congestion    2 weeks; cough, nasal/head/chest congestion, phlegm. Pt has not taken COVID/Flu test. Pt has been taking Tylenol, Benadryl, nasal spay. Pt cannot tell if meds have helped relieve any symptoms.     Patient is a 89 y.o. female who presents for "congestion". I saw her 1 wk ago: A/P as of that visit: "viral URI with cough and congestion. Improving the last 1 to 2 days. Continue current symptomatic care."  HPI: Still with significant cough, fatigue, nasal congestion and sinus pressure.  Sometimes her face hurts and her upper teeth hurt. The mucus is clear.  She does hear some wheezing and tightness in her chest.  Poor sleep due to excessive cough at night. No fever. No dizziness, no nausea or vomiting. She is drinking fluids well and eating fine.  Past Medical History:  Diagnosis Date   Corneal dystrophies, hereditary    Diabetes mellitus 10/2010   A1c 6.7%    Diverticulosis    Dry eye syndrome    Hyperlipemia    Hypertension    Hypothyroidism    Left anterior shoulder pain    chronic, suspect biceps tendon etiology   Lumbar spondylosis 05/2016   x-ray   Macular degeneration, bilateral    Osteoarthritis    Presbyopia    Transfusion history 1959   post partum    Past Surgical History:  Procedure Laterality Date   APPENDECTOMY  1959   CATARACT EXTRACTION, BILATERAL     Dr Hazle Quant   CESAREAN SECTION  [phone]; 1959   COLECTOMY     18 inches removed 2002-2003 for Diverticulitis   COLONOSCOPY     Tics,Dr Sam Au Gres--pt declines any further screening as of 12/2015.   DEXA  06/30/2016;10/13/19   2017 T score 2.5.  Rpt 2020 NORMAL.  01/2022 T score -1.2   VAGINAL HYSTERECTOMY  1975   For Fibroids (no BSO)    Outpatient Medications Prior to Visit  Medication Sig Dispense Refill   atorvastatin (LIPITOR) 40 MG tablet Take 1 tablet (40 mg total) by mouth daily. 90 tablet 3   blood  glucose meter kit and supplies KIT Use to check blood sugar once daily. 1 each 0   cycloSPORINE (RESTASIS) 0.05 % ophthalmic emulsion Place 1 drop into both eyes 2 (two) times daily.     empagliflozin (JARDIANCE) 10 MG TABS tablet Take 1 tablet (10 mg total) by mouth daily. 90 tablet 1   hydrochlorothiazide (HYDRODIURIL) 25 MG tablet Take 1 tablet (25 mg total) by mouth daily. 90 tablet 1   Lancets (ONETOUCH DELICA PLUS LANCET33G) MISC Apply topically daily. Use to check blood sugar once daily.     levothyroxine (SYNTHROID) 50 MCG tablet TAKE 1 TABLET EVERY DAY EXCEPT TAKE 1/2 TABLET ON TUESDAY, THURSDAY, AND SATURDAY 72 tablet 2   metroNIDAZOLE (METROCREAM) 0.75 % cream Apply topically 2 (two) times daily. 45 g 6   ONETOUCH VERIO test strip Use to check blood sugar once daily.     ramipril (ALTACE) 5 MG capsule Take 1 capsule by mouth once daily 90 capsule 0   No facility-administered medications prior to visit.    Allergies  Allergen Reactions   Doxycycline Hyclate Diarrhea   Penicillins     Rash Because of a history of documented adverse serious drug reaction;Medi Alert bracelet  is recommended   Tetanus Toxoid     REACTION: Arm swelling-severe Because of a history  of documented adverse serious drug reaction;Medi Alert bracelet  is recommended   Metformin And Related Nausea And Vomiting and Other (See Comments)    Abdominal pain    Review of Systems  As per HPI  PE:    09/02/2023   10:59 AM 08/26/2023    9:40 AM 08/02/2023    2:51 PM  Vitals with BMI  Weight 169 lbs 13 oz 169 lbs 6 oz 171 lbs 3 oz  Systolic 96 141 119  Diastolic 66 81 77  Pulse 77 73 74     Physical Exam  VS: noted--normal. Gen: alert, NAD, NONTOXIC APPEARING. HEENT: eyes without injection, drainage, or swelling.  Ears: EACs clear, TMs with normal light reflex and landmarks.  Nose: Clear rhinorrhea, with some dried, crusty exudate adherent to mildly injected mucosa.  No purulent d/c.  No paranasal  sinus TTP.  No facial swelling.  Throat and mouth without focal lesion.  No pharyngial swelling, erythema, or exudate.   Neck: supple, no LAD.   LUNGS: She has coarse rhonchi on forced exhalation for her initial couple of breaths.  She coughed a lot and then all lung fields CTA bilat, nonlabored resps.   CV: RRR, no m/r/g. EXT: no c/c/e SKIN: no rash   LABS:  Last CBC Lab Results  Component Value Date   WBC 5.7 02/21/2023   HGB 13.8 02/21/2023   HCT 42.0 02/21/2023   MCV 93.0 02/21/2023   RDW 13.4 02/21/2023   PLT 199.0 02/21/2023   Last metabolic panel Lab Results  Component Value Date   GLUCOSE 108 (H) 08/26/2023   NA 141 08/26/2023   K 3.8 08/26/2023   CL 98 08/26/2023   CO2 34 (H) 08/26/2023   BUN 24 (H) 08/26/2023   CREATININE 0.78 08/26/2023   GFR 68.35 08/26/2023   CALCIUM 9.8 08/26/2023   PROT 6.1 02/21/2023   ALBUMIN 3.7 02/21/2023   BILITOT 0.7 02/21/2023   ALKPHOS 62 02/21/2023   AST 18 02/21/2023   ALT 17 02/21/2023   IMPRESSION AND PLAN:  Acute bronchitis, symptoms greater than 2 weeks. Depo-Medrol 40 mg IM today. Tomorrow start prednisone 40 mg a day x 5 days. Levaquin 500 mg daily x 7 days (doxy and penicillin allergies). Continue over-the-counter Robitussin DM as needed  An After Visit Summary was printed and given to the patient.  FOLLOW UP: Return in about 1 week (around 09/09/2023) for f/u bronchitis.  Signed:  Santiago Bumpers, MD           09/02/2023

## 2023-09-05 ENCOUNTER — Telehealth: Payer: Self-pay

## 2023-09-05 NOTE — Telephone Encounter (Signed)
Please review and advise.

## 2023-09-05 NOTE — Telephone Encounter (Signed)
Patient thinks she may be having an allergic reaction to antibiotic.  She has had diarrhea since she started taking med.  Please advise. [phone].

## 2023-09-05 NOTE — Telephone Encounter (Signed)
Stop antibiotic but go ahead and keep taking prednisone. No new antibiotic at this time.

## 2023-09-06 NOTE — Telephone Encounter (Signed)
Pt advised of provider recommendations.

## 2023-09-10 ENCOUNTER — Encounter: Payer: Self-pay | Admitting: Family Medicine

## 2023-09-10 ENCOUNTER — Ambulatory Visit: Payer: Medicare Other | Admitting: Family Medicine

## 2023-09-10 VITALS — BP 140/80 | HR 70 | Temp 97.8°F | Ht 66.0 in | Wt 167.4 lb

## 2023-09-10 DIAGNOSIS — J209 Acute bronchitis, unspecified: Secondary | ICD-10-CM

## 2023-09-10 NOTE — Progress Notes (Signed)
OFFICE VISIT  09/10/2023  CC:  Chief Complaint  Patient presents with   Follow-up    Bronchitis; pt completed course of Levaquin and Prednisone, last dose Sunday.    Patient is a 89 y.o. female who presents for 1 week follow-up bronchitis. A/P as of last visit: "Acute bronchitis, symptoms greater than 2 weeks. Depo-Medrol 40 mg IM today. Tomorrow start prednisone 40 mg a day x 5 days. Levaquin 500 mg daily x 7 days (doxy and penicillin allergies). Continue over-the-counter Robitussin DM as needed"  INTERIM HX:  Glucose went up to 160-190 a couple times when she got on prednisone.   Now back to 124. She feels improved.  Still with some postnasal drip and coughing of clear mucus. No fever, no wheezing or shortness of breath, no chest pain. She continues to gradually get her energy back.  She is eating pretty well now.   Past Medical History:  Diagnosis Date   Corneal dystrophies, hereditary    Diabetes mellitus 10/2010   A1c 6.7%    Diverticulosis    Dry eye syndrome    Hyperlipemia    Hypertension    Hypothyroidism    Left anterior shoulder pain    chronic, suspect biceps tendon etiology   Lumbar spondylosis 05/2016   x-ray   Macular degeneration, bilateral    Osteoarthritis    Presbyopia    Transfusion history 1959   post partum    Past Surgical History:  Procedure Laterality Date   APPENDECTOMY  1959   CATARACT EXTRACTION, BILATERAL     Dr Digby   CESAREAN SECTION  1957; 1959   COLECTOMY     18  inches removed 2002-2003 for Diverticulitis   COLONOSCOPY     Tics,Dr Sam Morton--pt declines any further screening as of 12/2015.   DEXA  06/30/2016;10/13/19   2017 T score 2.5.  Rpt 2020 NORMAL.  01/2022 T score -1.2   VAGINAL HYSTERECTOMY  1975   For Fibroids (no BSO)    Outpatient Medications Prior to Visit  Medication Sig Dispense Refill   atorvastatin (LIPITOR) 40 MG tablet Take 1 tablet (40 mg total) by mouth daily. 90 tablet 3   blood glucose meter kit  and supplies KIT Use to check blood sugar once daily. 1 each 0   cycloSPORINE (RESTASIS) 0.05 % ophthalmic emulsion Place 1 drop into both eyes 2 (two) times daily.     empagliflozin (JARDIANCE) 10 MG TABS tablet Take 1 tablet (10 mg total) by mouth daily. 90 tablet 1   hydrochlorothiazide (HYDRODIURIL) 25 MG tablet Take 1 tablet (25 mg total) by mouth daily. 90 tablet 1   Lancets (ONETOUCH DELICA PLUS LANCET33G) MISC Apply topically daily. Use to check blood sugar once daily.     levothyroxine (SYNTHROID) 50 MCG tablet TAKE 1 TABLET EVERY DAY EXCEPT TAKE 1/2 TABLET ON TUESDAY, THURSDAY, AND SATURDAY 72 tablet 2   metroNIDAZOLE (METROCREAM) 0.75 % cream Apply topically 2 (two) times daily. 45 g 6   ONETOUCH VERIO test strip Use to check blood sugar once daily.     ramipril (ALTACE) 5 MG capsule Take 1 capsule by mouth once daily 90 capsule 0   predniSONE (DELTASONE) 20 MG tablet 2 tabs po every day x 5d (Patient not taking: Reported on 09/10/2023) 10 tablet 0   No facility-administered medications prior to visit.    Allergies  Allergen Reactions   Doxycycline Hyclate Diarrhea   Penicillins     Rash Because of a history of documented adverse  serious drug reaction;Medi Alert bracelet  is recommended   Tetanus Toxoid     REACTION: Arm swelling-severe Because of a history of documented adverse serious drug reaction;Medi Alert bracelet  is recommended   Metformin And Related Nausea And Vomiting and Other (See Comments)    Abdominal pain   Pioglitazone Other (See Comments)    Weight gain    Review of Systems As per HPI  PE:    09/10/2023   10:02 AM 09/10/2023    9:43 AM 09/02/2023   10:59 AM  Vitals with BMI  Height  5\' 6"    Weight  167 lbs 6 oz 169 lbs 13 oz  BMI  27.03   Systolic 140 147 96  Diastolic 80 84 66  Pulse  70 77    Physical Exam  Gen: Alert, well appearing.  Patient is oriented to person, place, time, and situation. AFFECT: pleasant, lucid thought and  speech. CV: RRR, no m/r/g.   LUNGS: CTA bilat, nonlabored resps, good aeration in all lung fields. EXT: no clubbing or cyanosis.  no edema.    LABS:  Last CBC Lab Results  Component Value Date   WBC 5.7 02/21/2023   HGB 13.8 02/21/2023   HCT 42.0 02/21/2023   MCV 93.0 02/21/2023   RDW 13.4 02/21/2023   PLT 199.0 02/21/2023   Last metabolic panel Lab Results  Component Value Date   GLUCOSE 108 (H) 08/26/2023   NA 141 08/26/2023   K 3.8 08/26/2023   CL 98 08/26/2023   CO2 34 (H) 08/26/2023   BUN 24 (H) 08/26/2023   CREATININE 0.78 08/26/2023   GFR 68.35 08/26/2023   CALCIUM 9.8 08/26/2023   PROT 6.1 02/21/2023   ALBUMIN 3.7 02/21/2023   BILITOT 0.7 02/21/2023   ALKPHOS 62 02/21/2023   AST 18 02/21/2023   ALT 17 02/21/2023   Last thyroid functions Lab Results  Component Value Date   TSH 0.39 02/21/2023   IMPRESSION AND PLAN:  Acute bronchitis. Improved appropriately status post antibiotic and prednisone burst. Expect gradual resolution over the next 1 to 2 weeks back to baseline state of health. Continue Mucinex DM every 12 hours as needed.  An After Visit Summary was printed and given to the patient.  FOLLOW UP: Return for Keep appointment already set for January 2025.  Signed:  Santiago Bumpers, MD           09/10/2023

## 2023-10-09 ENCOUNTER — Ambulatory Visit (INDEPENDENT_AMBULATORY_CARE_PROVIDER_SITE_OTHER): Payer: Medicare Other | Admitting: *Deleted

## 2023-10-09 DIAGNOSIS — Z0000 Encounter for general adult medical examination without abnormal findings: Secondary | ICD-10-CM | POA: Diagnosis not present

## 2023-10-09 NOTE — Patient Instructions (Signed)
[name] , Thank you for taking time to come for your Medicare Wellness Visit. I appreciate your ongoing commitment to your health goals. Please review the following plan we discussed and let me know if I can assist you in the future.   Screening recommendations/referrals: Colonoscopy: no longer required Mammogram: up to date Bone Density: up to date Recommended yearly ophthalmology/optometry visit for glaucoma screening and checkup Recommended yearly dental visit for hygiene and checkup  Vaccinations: Influenza vaccine: up to date Pneumococcal vaccine: up to date Tdap vaccine: up to date Shingles vaccine: up to date      Preventive Care 65 Years and Older, Female Preventive care refers to lifestyle choices and visits with your health care provider that can promote health and wellness. What does preventive care include? A yearly physical exam. This is also called an annual well check. Dental exams once or twice a year. Routine eye exams. Ask your health care provider how often you should have your eyes checked. Personal lifestyle choices, including: Daily care of your teeth and gums. Regular physical activity. Eating a healthy diet. Avoiding tobacco and drug use. Limiting alcohol use. Practicing safe sex. Taking low-dose aspirin every day. Taking vitamin and mineral supplements as recommended by your health care provider. What happens during an annual well check? The services and screenings done by your health care provider during your annual well check will depend on your age, overall health, lifestyle risk factors, and family history of disease. Counseling  Your health care provider may ask you questions about your: Alcohol use. Tobacco use. Drug use. Emotional well-being. Home and relationship well-being. Sexual activity. Eating habits. History of falls. Memory and ability to understand (cognition). Work and work Astronomer. Reproductive health. Screening  You  may have the following tests or measurements: Height, weight, and BMI. Blood pressure. Lipid and cholesterol levels. These may be checked every 5 years, or more frequently if you are over 49 years old. Skin check. Lung cancer screening. You may have this screening every year starting at age 79 if you have a 30-pack-year history of smoking and currently smoke or have quit within the past 15 years. Fecal occult blood test (FOBT) of the stool. You may have this test every year starting at age 52. Flexible sigmoidoscopy or colonoscopy. You may have a sigmoidoscopy every 5 years or a colonoscopy every 10 years starting at age 23. Hepatitis C blood test. Hepatitis B blood test. Sexually transmitted disease (STD) testing. Diabetes screening. This is done by checking your blood sugar (glucose) after you have not eaten for a while (fasting). You may have this done every 1-3 years. Bone density scan. This is done to screen for osteoporosis. You may have this done starting at age 20. Mammogram. This may be done every 1-2 years. Talk to your health care provider about how often you should have regular mammograms. Talk with your health care provider about your test results, treatment options, and if necessary, the need for more tests. Vaccines  Your health care provider may recommend certain vaccines, such as: Influenza vaccine. This is recommended every year. Tetanus, diphtheria, and acellular pertussis (Tdap, Td) vaccine. You may need a Td booster every 10 years. Zoster vaccine. You may need this after age 74. Pneumococcal 13-valent conjugate (PCV13) vaccine. One dose is recommended after age 22. Pneumococcal polysaccharide (PPSV23) vaccine. One dose is recommended after age 64. Talk to your health care provider about which screenings and vaccines you need and how often you need them. This  information is not intended to replace advice given to you by your health care provider. Make sure you discuss any  questions you have with your health care provider. Document Released: 11/18/2015 Document Revised: 07/11/2016 Document Reviewed: 08/23/2015 Elsevier Interactive Patient Education  2017 [employer].  Fall Prevention in the Home Falls can cause injuries. They can happen to people of all ages. There are many things you can do to make your home safe and to help prevent falls. What can I do on the outside of my home? Regularly fix the edges of walkways and driveways and fix any cracks. Remove anything that might make you trip as you walk through a door, such as a raised step or threshold. Trim any bushes or trees on the path to your home. Use bright outdoor lighting. Clear any walking paths of anything that might make someone trip, such as rocks or tools. Regularly check to see if handrails are loose or broken. Make sure that both sides of any steps have handrails. Any raised decks and porches should have guardrails on the edges. Have any leaves, snow, or ice cleared regularly. Use sand or salt on walking paths during winter. Clean up any spills in your garage right away. This includes oil or grease spills. What can I do in the bathroom? Use night lights. Install grab bars by the toilet and in the tub and shower. Do not use towel bars as grab bars. Use non-skid mats or decals in the tub or shower. If you need to sit down in the shower, use a plastic, non-slip stool. Keep the floor dry. Clean up any water that spills on the floor as soon as it happens. Remove soap buildup in the tub or shower regularly. Attach bath mats securely with double-sided non-slip rug tape. Do not have throw rugs and other things on the floor that can make you trip. What can I do in the bedroom? Use night lights. Make sure that you have a light by your bed that is easy to reach. Do not use any sheets or blankets that are too big for your bed. They should not hang down onto the floor. Have a firm chair that has side  arms. You can use this for support while you get dressed. Do not have throw rugs and other things on the floor that can make you trip. What can I do in the kitchen? Clean up any spills right away. Avoid walking on wet floors. Keep items that you use a lot in easy-to-reach places. If you need to reach something above you, use a strong step stool that has a grab bar. Keep electrical cords out of the way. Do not use floor polish or wax that makes floors slippery. If you must use wax, use non-skid floor wax. Do not have throw rugs and other things on the floor that can make you trip. What can I do with my stairs? Do not leave any items on the stairs. Make sure that there are handrails on both sides of the stairs and use them. Fix handrails that are broken or loose. Make sure that handrails are as long as the stairways. Check any carpeting to make sure that it is firmly attached to the stairs. Fix any carpet that is loose or worn. Avoid having throw rugs at the top or bottom of the stairs. If you do have throw rugs, attach them to the floor with carpet tape. Make sure that you have a light switch at the top  of the stairs and the bottom of the stairs. If you do not have them, ask someone to add them for you. What else can I do to help prevent falls? Wear shoes that: Do not have high heels. Have rubber bottoms. Are comfortable and fit you well. Are closed at the toe. Do not wear sandals. If you use a stepladder: Make sure that it is fully opened. Do not climb a closed stepladder. Make sure that both sides of the stepladder are locked into place. Ask someone to hold it for you, if possible. Clearly mark and make sure that you can see: Any grab bars or handrails. First and last steps. Where the edge of each step is. Use tools that help you move around (mobility aids) if they are needed. These include: Canes. Walkers. Scooters. Crutches. Turn on the lights when you go into a dark area.  Replace any light bulbs as soon as they burn out. Set up your furniture so you have a clear path. Avoid moving your furniture around. If any of your floors are uneven, fix them. If there are any pets around you, be aware of where they are. Review your medicines with your doctor. Some medicines can make you feel dizzy. This can increase your chance of falling. Ask your doctor what other things that you can do to help prevent falls. This information is not intended to replace advice given to you by your health care provider. Make sure you discuss any questions you have with your health care provider. Document Released: 08/18/2009 Document Revised: 03/29/2016 Document Reviewed: 11/26/2014 Elsevier Interactive Patient Education  2017 [employer].

## 2023-10-09 NOTE — Progress Notes (Signed)
Subjective:   Shannon Greene is a 89 y.o. female who presents for Medicare Annual (Subsequent) preventive examination.  Visit Complete: Virtual I connected with  Creed Copper on 10/09/23 by a audio enabled telemedicine application and verified that I am speaking with the correct person using two identifiers.  Patient Location: Home  Provider Location: Home Office  I discussed the limitations of evaluation and management by telemedicine. The patient expressed understanding and agreed to proceed.  Vital Signs: Because this visit was a virtual/telehealth visit, some criteria may be missing or patient reported. Any vitals not documented were not able to be obtained and vitals that have been documented are patient reported.   Cardiac Risk Factors include: advanced age (>59men, >24 women);diabetes mellitus;hypertension     Objective:    There were no vitals filed for this visit. There is no height or weight on file to calculate BMI.     10/09/2023    2:31 PM 10/03/2022    4:07 PM 06/15/2022    9:30 PM 08/10/2021   12:35 PM 08/03/2020    1:37 PM 07/30/2019    1:59 PM 07/04/2018    9:07 AM  Advanced Directives  Does Patient Have a Medical Advance Directive? Yes No No Yes Yes Yes Yes  Type of Clinical research associate of [employer] Power of [place];Living will Healthcare Power of [place];Living will Healthcare Power of [place];Living will  Copy of Healthcare Power of Attorney in Chart? Yes - validated most recent copy scanned in chart (See row information)   Yes - validated most recent copy scanned in chart (See row information) No - copy requested No - copy requested No - copy requested  Would patient like information on creating a medical advance directive?  No - Patient declined No - Patient declined        Current Medications (verified) Outpatient Encounter Medications as of 10/09/2023  Medication Sig   atorvastatin (LIPITOR)  40 MG tablet Take 1 tablet (40 mg total) by mouth daily.   blood glucose meter kit and supplies KIT Use to check blood sugar once daily.   cycloSPORINE (RESTASIS) 0.05 % ophthalmic emulsion Place 1 drop into both eyes 2 (two) times daily.   empagliflozin (JARDIANCE) 10 MG TABS tablet Take 1 tablet (10 mg total) by mouth daily.   hydrochlorothiazide (HYDRODIURIL) 25 MG tablet Take 1 tablet (25 mg total) by mouth daily.   Lancets (ONETOUCH DELICA PLUS LANCET33G) MISC Apply topically daily. Use to check blood sugar once daily.   levothyroxine (SYNTHROID) 50 MCG tablet TAKE 1 TABLET EVERY DAY EXCEPT TAKE 1/2 TABLET ON TUESDAY, THURSDAY, AND SATURDAY   metroNIDAZOLE (METROCREAM) 0.75 % cream Apply topically 2 (two) times daily.   ONETOUCH VERIO test strip Use to check blood sugar once daily.   ramipril (ALTACE) 5 MG capsule Take 1 capsule by mouth once daily   No facility-administered encounter medications on file as of 10/09/2023.    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   DEXA  06/30/2016;10/13/19   2017 T score 2.5.  Rpt 2020 NORMAL.  01/2022 T score -1.2   VAGINAL HYSTERECTOMY  1975    For Fibroids (no BSO)   Family History  Problem Relation Age of Onset   Heart attack Mother 66   Hypertension Mother    Tuberculosis Father    Diabetes Brother    Stroke Brother 39   Prostate cancer Brother    Breast cancer Neg Hx    Social History   Socioeconomic History   Marital status: Widowed    Spouse name: Not on file   Number of children: Not on file   Years of education: Not on file   Highest education level: Not on file  Occupational History   Not on file  Tobacco Use   Smoking status: Never   Smokeless tobacco: Never  Vaping Use   Vaping status: Never Used  Substance and Sexual Activity   Alcohol use: No   Drug use: No   Sexual activity: Not Currently  Other Topics Concern   Not on file  Social History Narrative   Widow as of 02/2022, 2 daughters.   Occupation: retired Scientist, forensic).   Silver sneakers.   No T/A/Ds.   Social Determinants of Health   Financial Resource Strain: Low Risk  (10/09/2023)   Overall Financial Resource Strain (CARDIA)    Difficulty of Paying Living Expenses: Not hard at all  Food Insecurity: No Food Insecurity (10/09/2023)   Hunger Vital Sign    Worried About Running Out of Food in the Last Year: Never true    Ran Out of Food in the Last Year: Never true  Transportation Needs: No Transportation Needs (10/09/2023)   PRAPARE - Administrator, Civil Service (Medical): No    Lack of Transportation (Non-Medical): No  Physical Activity: Inactive (10/09/2023)   Exercise Vital Sign    Days of Exercise per Week: 0 days    Minutes of Exercise per Session: 0 min  Stress: No Stress Concern Present (10/09/2023)   [employer] of Occupational Health - Occupational Stress Questionnaire    Feeling of Stress : Not at all  Social Connections: Socially Isolated (10/09/2023)   Social Connection and Isolation Panel [NHANES]    Frequency of Communication with Friends and Family: More than three times a week     Frequency of Social Gatherings with Friends and Family: Three times a week    Attends Religious Services: Never    Active Member of Clubs or Organizations: No    Attends Banker Meetings: Never    Marital Status: Widowed    Tobacco Counseling Counseling given: Not Answered   Clinical Intake:  Pre-visit preparation completed: Yes  Pain : No/denies pain     Diabetes: Yes CBG done?: No Did pt. bring in CBG monitor from home?: No  How often do you need to have someone help you when you read instructions, pamphlets, or other written materials from your doctor or pharmacy?: 1 - Never  Interpreter Needed?:  No  Information entered by :: Remi Haggard LPN   Activities of Daily Living    10/09/2023    2:33 PM  In your present state of health, do you have any difficulty performing the following activities:  Hearing? 0  Vision? 0  Difficulty concentrating or making decisions? 0  Walking or climbing stairs? 0  Dressing or bathing? 0  Doing errands, shopping? 0  Preparing Food and eating ? N  Using the Toilet? N  In the past six months, have you accidently leaked urine? N  Do you have problems with loss of bowel control? N  Managing your Medications? N  Managing your Finances? N  Housekeeping or managing your Housekeeping? N    Patient Care Team: Jeoffrey Massed, MD as PCP - General (Family Medicine) Nelson Chimes, MD as Consulting Physician (Ophthalmology)  Indicate any recent Medical Services you may have received from other than [facility] providers in the past year (date may be approximate).     Assessment:   This is a routine wellness examination for Shannon Greene.  Hearing/Vision screen Hearing Screening - Comments:: No trouble hearing Vision Screening - Comments:: Jimmey Ralph Up to date   Goals Addressed             This Visit's Progress    Patient Stated       Maintain current lifestyle       Depression Screen    10/09/2023    2:36 PM 09/10/2023     9:47 AM 08/02/2023    3:26 PM 05/23/2023    9:49 AM 02/21/2023    8:44 AM 11/22/2022   10:19 AM 10/03/2022    4:05 PM  PHQ 2/9 Scores  PHQ - 2 Score 0 0 0 0 0 0 0  PHQ- 9 Score 1   0 1      Fall Risk    10/09/2023    2:32 PM 09/10/2023    9:47 AM 08/02/2023    3:26 PM 05/23/2023    9:49 AM 02/21/2023    8:43 AM  Fall Risk   Falls in the past year? 1 1 1  0 0  Number falls in past yr: 0 0 0  0  Injury with Fall? 0 1 1  0  Risk for fall due to :  Impaired vision Impaired vision No Fall Risks No Fall Risks  Follow up Falls evaluation completed;Education provided;Falls prevention discussed Falls evaluation completed Falls evaluation completed Falls evaluation completed Falls evaluation completed    MEDICARE RISK AT HOME: Medicare Risk at Home If so, are there any without handrails?: No Home free of loose throw rugs in walkways, pet beds, electrical cords, etc?: Yes Adequate lighting in your home to reduce risk of falls?: Yes Life alert?: No Use of a cane, walker or w/c?: No Grab bars in the bathroom?: Yes Shower chair or bench in shower?: Yes Elevated toilet seat or a handicapped toilet?: Yes  TIMED UP AND GO:  Was the test performed?  No    Cognitive Function:    07/30/2019    2:01 PM 07/04/2018    9:08 AM  MMSE - Mini Mental State Exam  Orientation to time 5 5  Orientation to Place 5 5  Registration 3 3  Attention/ Calculation 5 5  Recall 3 3  Language- name 2 objects 2 2  Language- repeat 1 1  Language- follow 3 step command 3 3  Language- read & follow direction 1 1  Write a sentence 1 1  Copy design 1 1  Total score 30 30        10/09/2023    2:37 PM 10/03/2022    4:09 PM  6CIT Screen  What Year? 0 points 0 points  What month? 0 points 0 points  What time? 0 points 0 points  Count back from 20 0 points 0 points  Months in reverse 0 points 0 points  Repeat phrase 0 points 0 points  Total Score 0 points 0 points    Immunizations Immunization History   Administered Date(s) Administered   Fluad Quad(high Dose 65+) 07/14/2019, 07/21/2020, 07/20/2021, 08/06/2022   Fluad Trivalent(High Dose 65+) 08/02/2023   Influenza Whole 07/06/2012   Influenza, High Dose Seasonal PF 07/19/2014, 07/28/2015, 08/05/2017, 07/15/2018   Influenza,inj,Quad PF,6+ Mos 07/31/2016   PFIZER(Purple Top)SARS-COV-2 Vaccination 12/17/2019, 01/09/2020   PNEUMOCOCCAL CONJUGATE-20 10/19/2021   Pneumococcal Conjugate-13 12/16/2015   Pneumococcal-Unspecified 11/05/2001   RSV,unspecified 05/12/2023   Respiratory Syncytial Virus Vaccine,Recomb Aduvanted(Arexvy) 05/13/2023   Td 12/24/2016   Zoster Recombinant(Shingrix) 03/29/2017, 08/12/2017   Zoster, Live 06/21/2016    TDAP status: Up to date  Flu Vaccine status: Up to date  Pneumococcal vaccine status: Up to date  Covid-19 vaccine status: Declined, Education has been provided regarding the importance of this vaccine but patient still declined. Advised may receive this vaccine at local pharmacy or Health Dept.or vaccine clinic. Aware to provide a copy of the vaccination record if obtained from local pharmacy or Health Dept. Verbalized acceptance and understanding.  Qualifies for Shingles Vaccine? No   Zostavax completed Yes   Shingrix Completed?: Yes  Screening Tests Health Maintenance  Topic Date Due   COVID-19 Vaccine (3 - Pfizer risk series) 10/25/2023 (Originally 02/06/2020)   FOOT EXAM  11/09/2023   HEMOGLOBIN A1C  02/24/2024   OPHTHALMOLOGY EXAM  07/02/2024   Medicare Annual Wellness (AWV)  10/08/2024   DEXA SCAN  01/22/2025   DTaP/Tdap/Td (2 - Tdap) 12/24/2026   Pneumonia Vaccine 8+ Years old  Completed   INFLUENZA VACCINE  Completed   Zoster Vaccines- Shingrix  Completed   HPV VACCINES  Aged Out    Health Maintenance  There are no preventive care reminders to display for this patient.   Colorectal cancer screening: No longer required.   Mammogram status: Completed  . Repeat every year  Bone  Density status: Completed 2023. Results reflect: Bone density results: OSTEOPOROSIS. Repeat every 3 years.  Lung Cancer Screening: (Low Dose CT Chest recommended if Age 67-80 years, 20 pack-year currently smoking OR have quit w/in 15years.) does not qualify.   Lung Cancer Screening Referral:   Additional Screening:  Hepatitis C Screening: does not qualify;  Vision Screening: Recommended annual ophthalmology exams for early detection of glaucoma and other disorders of the eye. Is the patient up to date with their annual eye exam?  Yes  Who is the provider or what is the name of the office in which the patient attends annual eye exams? [facility] If pt is not established with a provider, would they like to be referred to a provider to establish care? No .   Dental Screening: Recommended annual dental exams for proper oral hygiene  Nutrition Risk Assessment:  Has the patient had any N/V/D within the last 2 months?  No  Does the patient have any non-healing wounds?  No  Has the patient had any unintentional weight loss or weight gain?  No   Diabetes:  Is the patient diabetic?  Yes  If diabetic, was a CBG  obtained today?  No  Did the patient bring in their glucometer from home?  No  How often do you monitor your CBG's? 1 x a day.   Financial Strains and Diabetes Management:  Are you having any financial strains with the device, your supplies or your medication? No .  Does the patient want to be seen by Chronic Care Management for management of their diabetes?  No  Would the patient like to be referred to a Nutritionist or for Diabetic Management?  No   Diabetic Exams:  Diabetic Eye Exam: Completed . Overdue for diabetic eye exam. Pt has been advised about the importance in completing this exam.   Diabetic Foot Exam: Pt has been advised about the importance in completing this exam.    Community Resource Referral / Chronic Care Management: CRR required this visit?  No   CCM required  this visit?  No     Plan:     I have personally reviewed and noted the following in the patient's chart:   Medical and social history Use of alcohol, tobacco or illicit drugs  Current medications and supplements including opioid prescriptions. Patient is not currently taking opioid prescriptions. Functional ability and status Nutritional status Physical activity Advanced directives List of other physicians Hospitalizations, surgeries, and ER visits in previous 12 months Vitals Screenings to include cognitive, depression, and falls Referrals and appointments  In addition, I have reviewed and discussed with patient certain preventive protocols, quality metrics, and best practice recommendations. A written personalized care plan for preventive services as well as general preventive health recommendations were provided to patient.     Remi Haggard, LPN   [id number]   After Visit Summary: (MyChart) Due to this being a telephonic visit, the after visit summary with patients personalized plan was offered to patient via MyChart   Nurse Notes:

## 2023-10-10 ENCOUNTER — Encounter: Payer: Self-pay | Admitting: Urgent Care

## 2023-10-10 ENCOUNTER — Ambulatory Visit: Payer: Medicare Other | Admitting: Urgent Care

## 2023-10-10 VITALS — BP 123/74 | HR 96 | Temp 98.3°F | Wt 168.8 lb

## 2023-10-10 DIAGNOSIS — S81811A Laceration without foreign body, right lower leg, initial encounter: Secondary | ICD-10-CM | POA: Diagnosis not present

## 2023-10-10 NOTE — Progress Notes (Signed)
Established Patient Office Visit  Subjective:  Patient ID: Shannon Greene, female    DOB: 1934/06/05  Age: 89 y.o. MRN: [id number]  Chief Complaint  Patient presents with   Laceration    Right leg happened about an 1-2 hrs ago; used wound spray and ice pack    The patient, aged 61, presented with a large skin tear on the R anterior shin, sustained earlier in the day while decorating a Christmas tree. This occurred about 3 hours prior to presentation. The patient reported hitting the corner of a coffee table, resulting in immediate and persistent bleeding. The patient attempted to manage the wound at home using supplies from previous hospital visits, including tape and gauze. The patient reported that the injury seemed to have hit the bone and scraped off the skin. She has been able to ambulate normally since the injury.  The patient denied being on any blood thinners, aspirin, or anti-inflammatories. The only medications reported were those listed in the patient's medical record and no OTCs are taken. The patient also reported a history of controlled diabetes, with a recent A1C of 6.7, managed with Jardiance.  The patient lives alone and is generally independent, engaging in activities such as decorating a Christmas tree and shopping. The patient reported no pets at home. The patient's late husband had multiple hospital admissions in the past two years, and the patient has kept medical supplies from these visits. She is reportedly allergic to the tetanus vaccination.  Laceration     Patient Active Problem List   Diagnosis Date Noted   Overweight (BMI 25.0-29.9) 07/14/2019   Diabetes mellitus without complication (HCC) 04/19/2016   Increased pressure in the eye 01/23/2014   Diverticulosis 11/13/2011   Diabetes type 2, controlled (HCC) 10/17/2010   POSTMENOPAUSAL SYNDROME 08/31/2009   GOITER 10/26/2008   Hypothyroidism 10/26/2008   Hyperlipidemia 10/26/2008   Essential hypertension  10/26/2008   OSTEOARTHRITIS 10/26/2008   Past Medical History:  Diagnosis Date   Corneal dystrophies, hereditary    Diabetes mellitus 10/2010   A1c 6.7%    Diverticulosis    Dry eye syndrome    Hyperlipemia    Hypertension    Hypothyroidism    Left anterior shoulder pain    chronic, suspect biceps tendon etiology   Lumbar spondylosis 05/2016   x-ray   Macular degeneration, bilateral    Osteoarthritis    Presbyopia    Transfusion history 1959   post partum   Social History   Tobacco Use   Smoking status: Never   Smokeless tobacco: Never  Vaping Use   Vaping status: Never Used  Substance Use Topics   Alcohol use: No   Drug use: No      ROS: as noted in HPI  Objective:     BP 123/74   Pulse 96   Temp 98.3 F (36.8 C)   Wt 168 lb 12.8 oz (76.6 kg)   SpO2 95%   BMI 27.25 kg/m  BP Readings from Last 3 Encounters:  10/10/23 123/74  09/10/23 (!) 140/80  09/02/23 96/66   Wt Readings from Last 3 Encounters:  10/10/23 168 lb 12.8 oz (76.6 kg)  09/10/23 167 lb 6.4 oz (75.9 kg)  09/02/23 169 lb 12.8 oz (77 kg)      Physical Exam Vitals and nursing note reviewed.  Constitutional:      General: She is not in acute distress.    Appearance: Normal appearance. She is not ill-appearing, toxic-appearing or diaphoretic.  HENT:  Head: Normocephalic and atraumatic.  Cardiovascular:     Rate and Rhythm: Normal rate.     Pulses: Normal pulses.  Pulmonary:     Effort: Pulmonary effort is normal. No respiratory distress.  Musculoskeletal:        General: Signs of injury present. No swelling.     Right lower leg: No edema.     Left lower leg: No edema.       Legs:  Skin:    General: Skin is warm and dry.  Neurological:     General: No focal deficit present.     Mental Status: She is alert and oriented to person, place, and time.     Gait: Gait normal.      No results found for any visits on 10/10/23.  Skin excision  Date/Time: 10/10/2023 3:20  PM  Performed by: Maretta Bees, PA Authorized by: Maretta Bees, PA   Number of Lesions: 1 Lesion 1:    Body area: lower extremity   Lower extremity location: R lower leg   Skin lesion 1 size (mm): 2" x 2" in diameter.   Destruction method comment: scissors used to removed skin flap   Repair comments: Skin debridement using sterile scissors.  Wound was irrigated with sterile saline solution. Silver nitrate stick used to stop the bleeding from two superficial capillaries. Pt tolerated well, bleeding stopped immediately. Topical antibiotic ointment applied, followed by two telfa pads and wrapped loosely with coban.    The ASCVD Risk score (Arnett DK, et al., 2019) failed to calculate for the following reasons:   The 2019 ASCVD risk score is only valid for ages 7 to 36  Assessment & Plan:  Skin tear of right lower leg without complication, initial encounter   Skin Tear Sustained a large skin tear on the leg after hitting the corner of a coffee table. No blood thinners or anti-inflammatories. Bleeding controlled s/p application of silver nitrate. Risk of infection due to open wound. -Check for Xeroform at home for wound care. If not available, purchase from pharmacy. -Apply Xeroform during the day and leave wound open at night to prevent maceration. -Return for wound check on 10/14/2023. -Pt allergic to tetanus vaccination therefore cannot be updated.  Return in about 4 days (around 10/14/2023).   Maretta Bees, PA

## 2023-10-10 NOTE — Patient Instructions (Addendum)
Please apply xeroform petroleum guaze pad to the wound. You will apply this in the morning, leave on throughout the day, and take off at night. Starting tomorrow night, leave the wound open only while you sleep.  Apply a new xeroform petroleum pad every morning. Cover it with a non-stick (telfa) pad and then wrap loosely with coban.  Please do not soak your wound in a bathtub; a normal shower is fine.  Please schedule a follow up visit in 4 days for a wound recheck.

## 2023-10-11 ENCOUNTER — Ambulatory Visit: Payer: Medicare Other | Admitting: Urgent Care

## 2023-10-14 ENCOUNTER — Ambulatory Visit: Payer: Medicare Other | Admitting: Family Medicine

## 2023-10-14 ENCOUNTER — Encounter: Payer: Self-pay | Admitting: Family Medicine

## 2023-10-14 VITALS — BP 128/82 | HR 87 | Wt 166.0 lb

## 2023-10-14 DIAGNOSIS — S80811D Abrasion, right lower leg, subsequent encounter: Secondary | ICD-10-CM | POA: Diagnosis not present

## 2023-10-14 NOTE — Progress Notes (Signed)
OFFICE VISIT  10/14/2023  CC:  Chief Complaint  Patient presents with   Wound Check    R Leg; Pt states wound is healing; has no other concerns. Still applying dressing.      Patient is a 89 y.o. female who presents for wound check.  INTERIM HX: Shannon Greene came in 10/10/2023 and saw Shannon Greene, [place]. Encounter notes reviewed today. She hit her right lower leg on a table at home and sustained a skin tear. Silver nitrate was applied in the office.  UPDATE: no signif pain, no fever. Feeling well.   Past Medical History:  Diagnosis Date   Corneal dystrophies, hereditary    Diabetes mellitus 10/2010   A1c 6.7%    Diverticulosis    Dry eye syndrome    Hyperlipemia    Hypertension    Hypothyroidism    Left anterior shoulder pain    chronic, suspect biceps tendon etiology   Lumbar spondylosis 05/2016   x-ray   Macular degeneration, bilateral    Osteoarthritis    Presbyopia    Transfusion history 1959   post partum    Past Surgical History:  Procedure Laterality Date   APPENDECTOMY  1959   CATARACT EXTRACTION, BILATERAL     Shannon Shannon Greene   CESAREAN SECTION  [phone]; 1959   COLECTOMY     18 inches removed 2002-2003 for Diverticulitis   COLONOSCOPY     Tics,Shannon Greene --pt declines any further screening as of 12/2015.   DEXA  06/30/2016;10/13/19   2017 T score 2.5.  Rpt 2020 NORMAL.  01/2022 T score -1.2   VAGINAL HYSTERECTOMY  1975   For Fibroids (no BSO)    Outpatient Medications Prior to Visit  Medication Sig Dispense Refill   atorvastatin (LIPITOR) 40 MG tablet Take 1 tablet (40 mg total) by mouth daily. 90 tablet 3   blood glucose meter kit and supplies KIT Use to check blood sugar once daily. 1 each 0   cycloSPORINE (RESTASIS) 0.05 % ophthalmic emulsion Place 1 drop into both eyes 2 (two) times daily.     empagliflozin (JARDIANCE) 10 MG TABS tablet Take 1 tablet (10 mg total) by mouth daily. 90 tablet 1   hydrochlorothiazide (HYDRODIURIL) 25 MG tablet Take 1 tablet (25  mg total) by mouth daily. 90 tablet 1   Lancets (ONETOUCH DELICA PLUS LANCET33G) MISC Apply topically daily. Use to check blood sugar once daily.     levothyroxine (SYNTHROID) 50 MCG tablet TAKE 1 TABLET EVERY DAY EXCEPT TAKE 1/2 TABLET ON TUESDAY, THURSDAY, AND SATURDAY 72 tablet 2   metroNIDAZOLE (METROCREAM) 0.75 % cream Apply topically 2 (two) times daily. 45 g 6   ONETOUCH VERIO test strip Use to check blood sugar once daily.     ramipril (ALTACE) 5 MG capsule Take 1 capsule by mouth once daily 90 capsule 0   No facility-administered medications prior to visit.    Allergies  Allergen Reactions   Doxycycline Hyclate Diarrhea   Penicillins     Rash Because of a history of documented adverse serious drug reaction;Medi Alert bracelet  is recommended   Tetanus Toxoid     REACTION: Arm swelling-severe Because of a history of documented adverse serious drug reaction;Medi Alert bracelet  is recommended   Metformin And Related Nausea And Vomiting and Other (See Comments)    Abdominal pain   Pioglitazone Other (See Comments)    Weight gain    Review of Systems As per HPI  PE:    10/14/2023  1:18 PM 10/14/2023    1:11 PM 10/10/2023    2:55 PM  Vitals with BMI  Weight  166 lbs   Systolic 128 141 829  Diastolic 82 85 74  Pulse  87     Physical Exam  General: Alert and well-appearing. Affect: Pleasant Right shin with superficial abrasion with surrounding bruising.  It is not malodorous.  He does not have any bleeding areas.  No exudate.  LABS:  none  IMPRESSION AND PLAN:  Abrasion of right shin. Healing appropriately. Leave open to the air if possible, otherwise cover with Telfa.  An After Visit Summary was printed and given to the patient.  FOLLOW UP: No follow-ups on file.  Signed:  Santiago Bumpers, MD           10/14/2023

## 2023-10-22 ENCOUNTER — Encounter: Payer: Self-pay | Admitting: Family Medicine

## 2023-10-22 ENCOUNTER — Ambulatory Visit (INDEPENDENT_AMBULATORY_CARE_PROVIDER_SITE_OTHER): Payer: Medicare Other | Admitting: Family Medicine

## 2023-10-22 VITALS — BP 103/68 | HR 76 | Wt 166.2 lb

## 2023-10-22 DIAGNOSIS — S80811D Abrasion, right lower leg, subsequent encounter: Secondary | ICD-10-CM

## 2023-10-22 MED ORDER — SULFAMETHOXAZOLE-TRIMETHOPRIM 800-160 MG PO TABS: 1.0000 | ORAL_TABLET | Freq: Two times a day (BID) | ORAL | 0 refills | Status: DC

## 2023-10-22 NOTE — Progress Notes (Signed)
OFFICE VISIT  10/22/2023  CC:  Chief Complaint  Patient presents with   Abrasion    F/U. Pt still c/o of pain/irritation around area.     Patient is a 89 y.o. female who presents for 1 wk f/u R shin abrasion.  INTERIM HX: Right lower leg abrasion improving some.  The area of deep pink hue around the abrasion is decreasing. No fever or malaise. No wound exudate.  She leaves it open to the air in the daytime and covers it at night when she is in bed.  Past Medical History:  Diagnosis Date   Corneal dystrophies, hereditary    Diabetes mellitus 10/2010   A1c 6.7%    Diverticulosis    Dry eye syndrome    Hyperlipemia    Hypertension    Hypothyroidism    Left anterior shoulder pain    chronic, suspect biceps tendon etiology   Lumbar spondylosis 05/2016   x-ray   Macular degeneration, bilateral    Osteoarthritis    Presbyopia    Transfusion history 1959   post partum    Past Surgical History:  Procedure Laterality Date   APPENDECTOMY  1959   CATARACT EXTRACTION, BILATERAL     Dr Hazle Quant   CESAREAN SECTION  [phone]; 1959   COLECTOMY     18 inches removed 2002-2003 for Diverticulitis   COLONOSCOPY     Tics,Dr Sam McCloud--pt declines any further screening as of 12/2015.   DEXA  06/30/2016;10/13/19   2017 T score 2.5.  Rpt 2020 NORMAL.  01/2022 T score -1.2   VAGINAL HYSTERECTOMY  1975   For Fibroids (no BSO)    Outpatient Medications Prior to Visit  Medication Sig Dispense Refill   atorvastatin (LIPITOR) 40 MG tablet Take 1 tablet (40 mg total) by mouth daily. 90 tablet 3   blood glucose meter kit and supplies KIT Use to check blood sugar once daily. 1 each 0   cycloSPORINE (RESTASIS) 0.05 % ophthalmic emulsion Place 1 drop into both eyes 2 (two) times daily.     empagliflozin (JARDIANCE) 10 MG TABS tablet Take 1 tablet (10 mg total) by mouth daily. 90 tablet 1   hydrochlorothiazide (HYDRODIURIL) 25 MG tablet Take 1 tablet (25 mg total) by mouth daily. 90 tablet 1    Lancets (ONETOUCH DELICA PLUS LANCET33G) MISC Apply topically daily. Use to check blood sugar once daily.     levothyroxine (SYNTHROID) 50 MCG tablet TAKE 1 TABLET EVERY DAY EXCEPT TAKE 1/2 TABLET ON TUESDAY, THURSDAY, AND SATURDAY 72 tablet 2   metroNIDAZOLE (METROCREAM) 0.75 % cream Apply topically 2 (two) times daily. 45 g 6   ONETOUCH VERIO test strip Use to check blood sugar once daily.     ramipril (ALTACE) 5 MG capsule Take 1 capsule by mouth once daily 90 capsule 0   No facility-administered medications prior to visit.    Allergies  Allergen Reactions   Doxycycline Hyclate Diarrhea   Penicillins     Rash Because of a history of documented adverse serious drug reaction;Medi Alert bracelet  is recommended   Tetanus Toxoid     REACTION: Arm swelling-severe Because of a history of documented adverse serious drug reaction;Medi Alert bracelet  is recommended   Metformin And Related Nausea And Vomiting and Other (See Comments)    Abdominal pain   Pioglitazone Other (See Comments)    Weight gain    Review of Systems As per HPI  PE:    10/22/2023   10:51 AM 10/14/2023  1:18 PM 10/14/2023    1:11 PM  Vitals with BMI  Weight 166 lbs 3 oz  166 lbs  Systolic 103 128 130  Diastolic 68 82 85  Pulse 76  87     Physical Exam  Gen: Alert, well appearing.  Patient is oriented to person, place, time, and situation. AFFECT: pleasant, lucid thought and speech. Right shin with superficial abrasion with surrounding pinkish and violaceous discoloration. It is not malodorous.  No hemorrhage, scab, or exudate.  There is a scant amount of granulation tissue in the abrasion bed.  LABS:  None today  IMPRESSION AND PLAN:  Right shin abrasion.  Surrounding bruising.  Slow to improve.  Will add Bactrim double strength 1 twice daily x 7 days. Continue current wound care.  An After Visit Summary was printed and given to the patient.  FOLLOW UP: Return in about 9 days (around 10/31/2023)  for f/u leg.  Signed:  Santiago Bumpers, MD           10/22/2023

## 2023-10-30 NOTE — Progress Notes (Unsigned)
OFFICE VISIT  10/31/2023  CC:  Chief Complaint  Patient presents with   Leg Injury    Right leg wound check    Patient is a 89 y.o. female who presents for 1 wk f/u R leg abrasion.  A/P as of last visit: "Right shin abrasion.  Surrounding bruising.  Slow to improve.  Will add Bactrim double strength 1 twice daily x 7 days. Continue current wound care."  INTERIM HX: Still with deep pink hue to the skin surrounding the abrasion.  No fever or malaise.  She has been able to take the Bactrim at one half tab 3 times a day because full tab causes upset stomach. She is cleaning the wound appropriately and covers it at night and leaves it open to the air in the daytime  Past Medical History:  Diagnosis Date   Corneal dystrophies, hereditary    Diabetes mellitus 10/2010   A1c 6.7%    Diverticulosis    Dry eye syndrome    Hyperlipemia    Hypertension    Hypothyroidism    Left anterior shoulder pain    chronic, suspect biceps tendon etiology   Lumbar spondylosis 05/2016   x-ray   Macular degeneration, bilateral    Osteoarthritis    Presbyopia    Transfusion history 1959   post partum    Past Surgical History:  Procedure Laterality Date   APPENDECTOMY  1959   CATARACT EXTRACTION, BILATERAL     Dr Hazle Quant   CESAREAN SECTION  [phone]; 1959   COLECTOMY     18 inches removed 2002-2003 for Diverticulitis   COLONOSCOPY     Tics,Dr Sam Esmeralda--pt declines any further screening as of 12/2015.   DEXA  06/30/2016;10/13/19   2017 T score 2.5.  Rpt 2020 NORMAL.  01/2022 T score -1.2   VAGINAL HYSTERECTOMY  1975   For Fibroids (no BSO)    Outpatient Medications Prior to Visit  Medication Sig Dispense Refill   atorvastatin (LIPITOR) 40 MG tablet Take 1 tablet (40 mg total) by mouth daily. 90 tablet 3   blood glucose meter kit and supplies KIT Use to check blood sugar once daily. 1 each 0   cycloSPORINE (RESTASIS) 0.05 % ophthalmic emulsion Place 1 drop into both eyes 2 (two) times daily.      empagliflozin (JARDIANCE) 10 MG TABS tablet Take 1 tablet (10 mg total) by mouth daily. 90 tablet 1   hydrochlorothiazide (HYDRODIURIL) 25 MG tablet Take 1 tablet (25 mg total) by mouth daily. 90 tablet 1   Lancets (ONETOUCH DELICA PLUS LANCET33G) MISC Apply topically daily. Use to check blood sugar once daily.     levothyroxine (SYNTHROID) 50 MCG tablet TAKE 1 TABLET EVERY DAY EXCEPT TAKE 1/2 TABLET ON TUESDAY, THURSDAY, AND SATURDAY 72 tablet 2   metroNIDAZOLE (METROCREAM) 0.75 % cream Apply topically 2 (two) times daily. 45 g 6   ONETOUCH VERIO test strip Use to check blood sugar once daily.     ramipril (ALTACE) 5 MG capsule Take 1 capsule by mouth once daily 90 capsule 0   sulfamethoxazole-trimethoprim (BACTRIM DS) 800-160 MG tablet Take 1 tablet by mouth 2 (two) times daily. 14 tablet 0   No facility-administered medications prior to visit.    Allergies  Allergen Reactions   Doxycycline Hyclate Diarrhea   Penicillins     Rash Because of a history of documented adverse serious drug reaction;Medi Alert bracelet  is recommended   Tetanus Toxoid     REACTION: Arm swelling-severe Because  of a history of documented adverse serious drug reaction;Medi Alert bracelet  is recommended   Metformin And Related Nausea And Vomiting and Other (See Comments)    Abdominal pain   Pioglitazone Other (See Comments)    Weight gain    Review of Systems As per HPI  PE:    10/31/2023   10:30 AM 10/22/2023   10:51 AM 10/14/2023    1:18 PM  Vitals with BMI  Weight 164 lbs 3 oz 166 lbs 3 oz   Systolic 99 103 128  Diastolic 65 68 82  Pulse 75 76      Physical Exam  Gen: Alert, well appearing.  Patient is oriented to person, place, time, and situation. AFFECT: pleasant, lucid thought and speech. Right pretibial region with healing superficial abrasion with surrounding deep pink hue to the skin.  No warmth.  Minimal tenderness to touch.  No edema  LABS:  none  IMPRESSION AND  PLAN:  Right pretibial abrasion, healing.  Coming along slowly.  Question of some cellulitis around it but tough to tell.  Will continue Bactrim double strength and she will take half tab 2-3 times a day which is all she can tolerate.  Antibiotic choice limited because she has multiple antibiotic allergies.  An After Visit Summary was printed and given to the patient.  FOLLOW UP: Return in about 1 week (around 11/07/2023) for Follow-up right leg abrasion/infection. 11/26/23 [name] set Signed:  Santiago Bumpers, MD           10/31/2023

## 2023-10-31 ENCOUNTER — Encounter: Payer: Self-pay | Admitting: Family Medicine

## 2023-10-31 ENCOUNTER — Ambulatory Visit: Payer: Medicare Other | Admitting: Family Medicine

## 2023-10-31 VITALS — BP 99/65 | HR 75 | Temp 97.6°F | Wt 164.2 lb

## 2023-10-31 DIAGNOSIS — L03115 Cellulitis of right lower limb: Secondary | ICD-10-CM

## 2023-10-31 DIAGNOSIS — S80811D Abrasion, right lower leg, subsequent encounter: Secondary | ICD-10-CM | POA: Diagnosis not present

## 2023-10-31 MED ORDER — SULFAMETHOXAZOLE-TRIMETHOPRIM 800-160 MG PO TABS: 1.0000 | ORAL_TABLET | Freq: Two times a day (BID) | ORAL | 0 refills | Status: DC

## 2023-11-07 ENCOUNTER — Ambulatory Visit: Payer: Medicare Other | Admitting: Family Medicine

## 2023-11-07 ENCOUNTER — Encounter: Payer: Self-pay | Admitting: Family Medicine

## 2023-11-07 VITALS — BP 113/72 | HR 75 | Wt 166.2 lb

## 2023-11-07 DIAGNOSIS — S80811D Abrasion, right lower leg, subsequent encounter: Secondary | ICD-10-CM | POA: Diagnosis not present

## 2023-11-07 NOTE — Progress Notes (Signed)
 OFFICE VISIT  11/07/2023  CC:  Chief Complaint  Patient presents with   Abrasion    F/U.     Patient is a 89 y.o. female who presents for 9-day follow-up leg abrasion. A/P as of last visit: Right pretibial abrasion, healing.  Coming along slowly.  Question of some cellulitis around it but tough to tell.  Will continue Bactrim  double strength and she will take half tab 2-3 times a day which is all she can tolerate.  Antibiotic choice limited because she has multiple antibiotic allergies.   INTERIM HX: Leg feels fine. She continues to take one half of a Bactrim  tab daily. No fevers or malaise.  Past Medical History:  Diagnosis Date   Corneal dystrophies, hereditary    Diabetes mellitus 10/2010   A1c 6.7%    Diverticulosis    Dry eye syndrome    Hyperlipemia    Hypertension    Hypothyroidism    Left anterior shoulder pain    chronic, suspect biceps tendon etiology   Lumbar spondylosis 05/2016   x-ray   Macular degeneration, bilateral    Osteoarthritis    Presbyopia    Transfusion history 1959   post partum    Past Surgical History:  Procedure Laterality Date   APPENDECTOMY  1959   CATARACT EXTRACTION, BILATERAL     Dr Camillo   CESAREAN SECTION  [phone]; 1959   COLECTOMY     18 inches removed 2002-2003 for Diverticulitis   COLONOSCOPY     Tics,Dr Sam [place]--pt declines any further screening as of 12/2015.   DEXA  06/30/2016;10/13/19   2017 T score 2.5.  Rpt 2020 NORMAL.  01/2022 T score -1.2   VAGINAL HYSTERECTOMY  1975   For Fibroids (no BSO)    Outpatient Medications Prior to Visit  Medication Sig Dispense Refill   atorvastatin  (LIPITOR) 40 MG tablet Take 1 tablet (40 mg total) by mouth daily. 90 tablet 3   blood glucose meter kit and supplies KIT Use to check blood sugar once daily. 1 each 0   cycloSPORINE (RESTASIS) 0.05 % ophthalmic emulsion Place 1 drop into both eyes 2 (two) times daily.     empagliflozin  (JARDIANCE ) 10 MG TABS tablet Take 1 tablet (10 mg  total) by mouth daily. 90 tablet 1   hydrochlorothiazide  (HYDRODIURIL ) 25 MG tablet Take 1 tablet (25 mg total) by mouth daily. 90 tablet 1   Lancets (ONETOUCH DELICA PLUS LANCET33G) MISC Apply topically daily. Use to check blood sugar once daily.     levothyroxine  (SYNTHROID ) 50 MCG tablet TAKE 1 TABLET EVERY DAY EXCEPT TAKE 1/2 TABLET ON TUESDAY, THURSDAY, AND SATURDAY 72 tablet 2   metroNIDAZOLE  (METROCREAM ) 0.75 % cream Apply topically 2 (two) times daily. 45 g 6   ONETOUCH VERIO test strip Use to check blood sugar once daily.     ramipril  (ALTACE ) 5 MG capsule Take 1 capsule by mouth once daily 90 capsule 0   sulfamethoxazole -trimethoprim  (BACTRIM  DS) 800-160 MG tablet Take 1 tablet by mouth 2 (two) times daily. 14 tablet 0   No facility-administered medications prior to visit.    Allergies  Allergen Reactions   Doxycycline  Hyclate Diarrhea   Penicillins     Rash Because of a history of documented adverse serious drug reaction;Medi Alert bracelet  is recommended   Tetanus Toxoid     REACTION: Arm swelling-severe Because of a history of documented adverse serious drug reaction;Medi Alert bracelet  is recommended   Metformin  And Related Nausea And Vomiting  and Other (See Comments)    Abdominal pain   Pioglitazone  Other (See Comments)    Weight gain    Review of Systems As per HPI  PE:    11/07/2023    2:46 PM 10/31/2023   10:30 AM 10/22/2023   10:51 AM  Vitals with BMI  Weight 166 lbs 3 oz 164 lbs 3 oz 166 lbs 3 oz  Systolic 113 99 103  Diastolic 72 65 68  Pulse 75 75 76     Physical Exam  Gen: Alert, well appearing.  Patient is oriented to person, place, time, and situation. AFFECT: pleasant, lucid thought and speech. Pretibial aspect of right leg shows her skin tear/abrasion to be healed.  She does have mild to moderate surrounding deep pink-colored/violaceous skin that is nontender and does not have excessive warmth.  She has no edema.  LABS:  Last metabolic  panel Lab Results  Component Value Date   GLUCOSE 108 (H) 08/26/2023   NA 141 08/26/2023   K 3.8 08/26/2023   CL 98 08/26/2023   CO2 34 (H) 08/26/2023   BUN 24 (H) 08/26/2023   CREATININE 0.78 08/26/2023   GFR 68.35 08/26/2023   CALCIUM  9.8 08/26/2023   PROT 6.1 02/21/2023   ALBUMIN 3.7 02/21/2023   BILITOT 0.7 02/21/2023   ALKPHOS 62 02/21/2023   AST 18 02/21/2023   ALT 17 02/21/2023   IMPRESSION AND PLAN:  No problem-specific Assessment & Plan notes found for this encounter.  Right pretibial abrasion, healing.  Coming along slowly but looks fine. She will finish out her Bactrim  tabs by taking one half a day for the next 6 days.  An After Visit Summary was printed and given to the patient.  FOLLOW UP: Return in about 1 week (around 11/14/2023) for f/u leg abrasion. Next [name] appt 11/26/23 Signed:  Gerlene Hockey, MD           11/07/2023

## 2023-11-11 ENCOUNTER — Other Ambulatory Visit: Payer: Self-pay | Admitting: Family Medicine

## 2023-11-14 ENCOUNTER — Ambulatory Visit (INDEPENDENT_AMBULATORY_CARE_PROVIDER_SITE_OTHER): Payer: Medicare Other | Admitting: Family Medicine

## 2023-11-14 ENCOUNTER — Encounter: Payer: Self-pay | Admitting: Family Medicine

## 2023-11-14 VITALS — BP 140/83 | HR 80 | Wt 163.6 lb

## 2023-11-14 DIAGNOSIS — S80811D Abrasion, right lower leg, subsequent encounter: Secondary | ICD-10-CM | POA: Diagnosis not present

## 2023-11-14 NOTE — Progress Notes (Signed)
 OFFICE VISIT  11/14/2023  CC:  Chief Complaint  Patient presents with   Abrasion    F/U.     Patient is a 89 y.o. female who presents for 1 week follow-up right leg abrasion. A/P as of last visit: Right pretibial abrasion, healing.  Coming along slowly but looks fine. She will finish out her Bactrim  tabs by taking one half a day for the next 6 days.  INTERIM HX: Leg feels fine.  Color improving.  The initial injury was a little bit over a month ago.  Past Medical History:  Diagnosis Date   Corneal dystrophies, hereditary    Diabetes mellitus 10/2010   A1c 6.7%    Diverticulosis    Dry eye syndrome    Hyperlipemia    Hypertension    Hypothyroidism    Left anterior shoulder pain    chronic, suspect biceps tendon etiology   Lumbar spondylosis 05/2016   x-ray   Macular degeneration, bilateral    Osteoarthritis    Presbyopia    Transfusion history 1959   post partum    Past Surgical History:  Procedure Laterality Date   APPENDECTOMY  1959   CATARACT EXTRACTION, BILATERAL     Dr Camillo   CESAREAN SECTION  [phone]; 1959   COLECTOMY     18 inches removed 2002-2003 for Diverticulitis   COLONOSCOPY     Tics,Dr Sam [place]--pt declines any further screening as of 12/2015.   DEXA  06/30/2016;10/13/19   2017 T score 2.5.  Rpt 2020 NORMAL.  01/2022 T score -1.2   VAGINAL HYSTERECTOMY  1975   For Fibroids (no BSO)    Outpatient Medications Prior to Visit  Medication Sig Dispense Refill   atorvastatin  (LIPITOR) 40 MG tablet Take 1 tablet (40 mg total) by mouth daily. 90 tablet 3   blood glucose meter kit and supplies KIT Use to check blood sugar once daily. 1 each 0   cycloSPORINE (RESTASIS) 0.05 % ophthalmic emulsion Place 1 drop into both eyes 2 (two) times daily.     empagliflozin  (JARDIANCE ) 10 MG TABS tablet Take 1 tablet (10 mg total) by mouth daily. 90 tablet 1   hydrochlorothiazide  (HYDRODIURIL ) 25 MG tablet Take 1 tablet (25 mg total) by mouth daily. 90 tablet 1    Lancets (ONETOUCH DELICA PLUS LANCET33G) MISC Apply topically daily. Use to check blood sugar once daily.     levothyroxine  (SYNTHROID ) 50 MCG tablet TAKE 1 TABLET EVERY DAY EXCEPT TAKE 1/2 TABLET ON TUESDAY, THURSDAY, AND SATURDAY 72 tablet 2   metroNIDAZOLE  (METROCREAM ) 0.75 % cream Apply topically 2 (two) times daily. 45 g 6   ONETOUCH VERIO test strip Use to check blood sugar once daily.     ramipril  (ALTACE ) 5 MG capsule Take 1 capsule by mouth once daily 90 capsule 0   sulfamethoxazole -trimethoprim  (BACTRIM  DS) 800-160 MG tablet Take 1 tablet by mouth 2 (two) times daily. 14 tablet 0   No facility-administered medications prior to visit.    Allergies  Allergen Reactions   Doxycycline  Hyclate Diarrhea   Penicillins     Rash Because of a history of documented adverse serious drug reaction;Medi Alert bracelet  is recommended   Tetanus Toxoid     REACTION: Arm swelling-severe Because of a history of documented adverse serious drug reaction;Medi Alert bracelet  is recommended   Metformin  And Related Nausea And Vomiting and Other (See Comments)    Abdominal pain   Pioglitazone  Other (See Comments)    Weight gain  Review of Systems As per HPI  PE:    11/14/2023   11:13 AM 11/07/2023    2:46 PM 10/31/2023   10:30 AM  Vitals with BMI  Weight 163 lbs 10 oz 166 lbs 3 oz 164 lbs 3 oz  Systolic 140 113 99  Diastolic 83 72 65  Pulse 80 75 75     Physical Exam  Gen: Alert, well appearing.  Patient is oriented to person, place, time, and situation. Right pretibial surface with deep pink hue, no abrasion is present.  No tenderness or erythema.  She has some dry superficial peeling  LABS:  Last metabolic panel Lab Results  Component Value Date   GLUCOSE 108 (H) 08/26/2023   NA 141 08/26/2023   K 3.8 08/26/2023   CL 98 08/26/2023   CO2 34 (H) 08/26/2023   BUN 24 (H) 08/26/2023   CREATININE 0.78 08/26/2023   GFR 68.35 08/26/2023   CALCIUM  9.8 08/26/2023   PROT 6.1  02/21/2023   ALBUMIN 3.7 02/21/2023   BILITOT 0.7 02/21/2023   ALKPHOS 62 02/21/2023   AST 18 02/21/2023   ALT 17 02/21/2023   IMPRESSION AND PLAN:  Right pretibial abrasion is almost finished healing--->just residual skin discoloration. No sign of infxn.  An After Visit Summary was printed and given to the patient.  FOLLOW UP: Return for Keep appointment already set for 11/26/2023. Next [name] appointment 11/26/2023 Signed:  Gerlene Hockey, MD           11/14/2023

## 2023-11-26 ENCOUNTER — Encounter: Payer: Self-pay | Admitting: Family Medicine

## 2023-11-26 ENCOUNTER — Ambulatory Visit: Payer: Medicare Other | Admitting: Family Medicine

## 2023-11-26 ENCOUNTER — Other Ambulatory Visit: Payer: Self-pay | Admitting: Family Medicine

## 2023-11-26 VITALS — BP 126/78 | HR 78 | Wt 165.4 lb

## 2023-11-26 DIAGNOSIS — E7800 Pure hypercholesterolemia, unspecified: Secondary | ICD-10-CM | POA: Diagnosis not present

## 2023-11-26 DIAGNOSIS — E039 Hypothyroidism, unspecified: Secondary | ICD-10-CM | POA: Diagnosis not present

## 2023-11-26 DIAGNOSIS — I10 Essential (primary) hypertension: Secondary | ICD-10-CM | POA: Diagnosis not present

## 2023-11-26 DIAGNOSIS — Z7984 Long term (current) use of oral hypoglycemic drugs: Secondary | ICD-10-CM

## 2023-11-26 DIAGNOSIS — E119 Type 2 diabetes mellitus without complications: Secondary | ICD-10-CM | POA: Diagnosis not present

## 2023-11-26 LAB — TSH: TSH: 0.72 u[IU]/mL (ref 0.35–5.50)

## 2023-11-26 LAB — BASIC METABOLIC PANEL
BUN: 17 mg/dL (ref 6–23)
CO2: 34 meq/L — ABNORMAL HIGH (ref 19–32)
Calcium: 9.7 mg/dL (ref 8.4–10.5)
Chloride: 100 meq/L (ref 96–112)
Creatinine, Ser: 0.74 mg/dL (ref 0.40–1.20)
GFR: 72.68 mL/min (ref >60.00)
Glucose, Bld: 121 mg/dL — ABNORMAL HIGH (ref 70–99)
Potassium: 4.6 meq/L (ref 3.5–5.1)
Sodium: 142 meq/L (ref 135–145)

## 2023-11-26 LAB — POCT GLYCOSYLATED HEMOGLOBIN (HGB A1C)
HbA1c POC (<> result, manual entry): 7.1 % (ref 4.0–5.6)
HbA1c, POC (controlled diabetic range): 7.1 % — AB (ref 0.0–7.0)
HbA1c, POC (prediabetic range): 7.1 % — AB (ref 5.7–6.4)
Hemoglobin A1C: 7.1 % — AB (ref 4.0–5.6)

## 2023-11-26 LAB — LIPID PANEL
Cholesterol: 165 mg/dL (ref 0–200)
HDL: 57.9 mg/dL (ref >39.00)
LDL Cholesterol: 85 mg/dL (ref 0–99)
NonHDL: 107.45
Total CHOL/HDL Ratio: 3
Triglycerides: 110 mg/dL (ref 0.0–149.0)
VLDL: 22 mg/dL (ref 0.0–40.0)

## 2023-11-26 MED ORDER — HYDROCHLOROTHIAZIDE 25 MG PO TABS: 25.0000 mg | ORAL_TABLET | Freq: Every day | ORAL | 3 refills | Status: AC

## 2023-11-26 MED ORDER — LEVOTHYROXINE SODIUM 50 MCG PO TABS: ORAL_TABLET | ORAL | 3 refills | Status: AC

## 2023-11-26 NOTE — Progress Notes (Signed)
OFFICE VISIT  11/26/2023  CC:  Chief Complaint  Patient presents with   Diabetes    Pt is fasting.     Patient is a 89 y.o. female who presents for 63-month follow-up diabetes, hypertension, hypothyroidism, and hypercholesterolemia. A/P as of last visit: "1 diabetes without complication, great control on Jardiance 10 mg a day. POC Hba1c today is 6.8%.   2.  Hypertension, well-controlled on HCTZ 25 mg a day and ramipril 5 mg a day. Electrolytes and creatinine monitoring today.   #3 hypercholesterolemia.  Doing great on Lipitor 40 mg a day. Lipids excellent about 6 months ago. Plan recheck lipids in 3-6 months.    4 hypothyroidism, has been stable on 50 mcg tab, 1 daily except half tab on Tuesday Thursday and Saturday. TSH good 9 mo ago. Plan recheck 3 months.   #5 viral URI with cough and congestion. Improving the last 1 to 2 days. Continue current symptomatic care. Signs/symptoms to call or return for were reviewed and pt expressed understanding."  INTERIM HX: Feeling well. Right lower leg continues to improve.  No new concerns.  ROS as above, plus--> no fevers, no CP, no SOB, no wheezing, no cough, no dizziness, no HAs, no rashes, no melena/hematochezia.  No polyuria or polydipsia.  No myalgias or arthralgias.  No focal weakness, paresthesias, or tremors.  No acute vision or hearing abnormalities.  No dysuria or unusual/new urinary urgency or frequency.  No recent changes in lower legs. No n/v/d or abd pain.  No palpitations.     Past Medical History:  Diagnosis Date   Corneal dystrophies, hereditary    Diabetes mellitus 10/2010   A1c 6.7%    Diverticulosis    Dry eye syndrome    Hyperlipemia    Hypertension    Hypothyroidism    Left anterior shoulder pain    chronic, suspect biceps tendon etiology   Lumbar spondylosis 05/2016   x-ray   Macular degeneration, bilateral    Osteoarthritis    Presbyopia    Transfusion history 1959   post partum    Past  Surgical History:  Procedure Laterality Date   APPENDECTOMY  1959   CATARACT EXTRACTION, BILATERAL     Dr Hazle Quant   CESAREAN SECTION  [phone]; 1959   COLECTOMY     18 inches removed 2002-2003 for Diverticulitis   COLONOSCOPY     Tics,Dr Sam [place]--pt declines any further screening as of 12/2015.   DEXA  06/30/2016;10/13/19   2017 T score 2.5.  Rpt 2020 NORMAL.  01/2022 T score -1.2   VAGINAL HYSTERECTOMY  1975   For Fibroids (no BSO)    Outpatient Medications Prior to Visit  Medication Sig Dispense Refill   atorvastatin (LIPITOR) 40 MG tablet Take 1 tablet (40 mg total) by mouth daily. 90 tablet 3   blood glucose meter kit and supplies KIT Use to check blood sugar once daily. 1 each 0   cycloSPORINE (RESTASIS) 0.05 % ophthalmic emulsion Place 1 drop into both eyes 2 (two) times daily.     empagliflozin (JARDIANCE) 10 MG TABS tablet Take 1 tablet (10 mg total) by mouth daily. 90 tablet 1   hydrochlorothiazide (HYDRODIURIL) 25 MG tablet Take 1 tablet (25 mg total) by mouth daily. 90 tablet 1   Lancets (ONETOUCH DELICA PLUS LANCET33G) MISC Apply topically daily. Use to check blood sugar once daily.     levothyroxine (SYNTHROID) 50 MCG tablet TAKE 1 TABLET EVERY DAY EXCEPT TAKE 1/2 TABLET ON TUESDAY, THURSDAY, AND  SATURDAY 72 tablet 2   metroNIDAZOLE (METROCREAM) 0.75 % cream Apply topically 2 (two) times daily. 45 g 6   ONETOUCH VERIO test strip Use to check blood sugar once daily.     ramipril (ALTACE) 5 MG capsule Take 1 capsule by mouth once daily 90 capsule 0   sulfamethoxazole-trimethoprim (BACTRIM DS) 800-160 MG tablet Take 1 tablet by mouth 2 (two) times daily. (Patient not taking: Reported on 11/26/2023) 14 tablet 0   No facility-administered medications prior to visit.    Allergies  Allergen Reactions   Doxycycline Hyclate Diarrhea   Penicillins     Rash Because of a history of documented adverse serious drug reaction;Medi Alert bracelet  is recommended   Tetanus Toxoid      REACTION: Arm swelling-severe Because of a history of documented adverse serious drug reaction;Medi Alert bracelet  is recommended   Metformin And Related Nausea And Vomiting and Other (See Comments)    Abdominal pain   Pioglitazone Other (See Comments)    Weight gain    Review of Systems As per HPI  PE:    11/26/2023    9:40 AM 11/14/2023   11:13 AM 11/07/2023    2:46 PM  Vitals with BMI  Weight 165 lbs 6 oz 163 lbs 10 oz 166 lbs 3 oz  Systolic 126 140 621  Diastolic 78 83 72  Pulse 78 80 75     Physical Exam  Gen: Alert, well appearing.  Patient is oriented to person, place, time, and situation. AFFECT: pleasant, lucid thought and speech. CV: RRR, no m/r/g.   LUNGS: CTA bilat, nonlabored resps, good aeration in all lung fields. EXT: no clubbing or cyanosis.  no edema.  Right pretibial surface deep pink hue.  No erythema.  There is some superficial dry flaking.  No swelling.  LABS:  Last CBC Lab Results  Component Value Date   WBC 5.7 02/21/2023   HGB 13.8 02/21/2023   HCT 42.0 02/21/2023   MCV 93.0 02/21/2023   RDW 13.4 02/21/2023   PLT 199.0 02/21/2023   Last metabolic panel Lab Results  Component Value Date   GLUCOSE 108 (H) 08/26/2023   NA 141 08/26/2023   K 3.8 08/26/2023   CL 98 08/26/2023   CO2 34 (H) 08/26/2023   BUN 24 (H) 08/26/2023   CREATININE 0.78 08/26/2023   GFR 68.35 08/26/2023   CALCIUM 9.8 08/26/2023   PROT 6.1 02/21/2023   ALBUMIN 3.7 02/21/2023   BILITOT 0.7 02/21/2023   ALKPHOS 62 02/21/2023   AST 18 02/21/2023   ALT 17 02/21/2023   Last lipids Lab Results  Component Value Date   CHOL 147 02/21/2023   HDL 56.30 02/21/2023   LDLCALC 71 02/21/2023   TRIG 96.0 02/21/2023   CHOLHDL 3 02/21/2023   Last hemoglobin A1c Lab Results  Component Value Date   HGBA1C 7.1 (A) 11/26/2023   HGBA1C 7.1 11/26/2023   HGBA1C 7.1 (A) 11/26/2023   HGBA1C 7.1 (A) 11/26/2023   Last thyroid functions Lab Results  Component Value Date   TSH  0.39 02/21/2023   IMPRESSION AND PLAN:  1 diabetes without complication, doing well on Jardiance 10 mg a day. POC Hba1c today is 7.1%.   2.  Hypertension, well-controlled on HCTZ 25 mg a day and ramipril 5 mg a day. Electrolytes and creatinine monitoring today.   #3 hypercholesterolemia.  Doing great on Lipitor 40 mg a day. Lipids excellent about 9 months ago. Lipid panel today.    4  hypothyroidism, has been stable on 50 mcg tab, 1 daily except half tab on Tuesday Thursday and Saturday. TSH today.  An After Visit Summary was printed and given to the patient.  FOLLOW UP: No follow-ups on file.  Signed:  Santiago Bumpers, MD           11/26/2023

## 2023-11-27 ENCOUNTER — Encounter: Payer: Self-pay | Admitting: Family Medicine

## 2023-12-18 ENCOUNTER — Other Ambulatory Visit: Payer: Self-pay | Admitting: Family Medicine

## 2023-12-18 DIAGNOSIS — Z1231 Encounter for screening mammogram for malignant neoplasm of breast: Secondary | ICD-10-CM

## 2024-01-15 DIAGNOSIS — E1165 Type 2 diabetes mellitus with hyperglycemia: Secondary | ICD-10-CM | POA: Diagnosis not present

## 2024-01-15 DIAGNOSIS — H353132 Nonexudative age-related macular degeneration, bilateral, intermediate dry stage: Secondary | ICD-10-CM | POA: Diagnosis not present

## 2024-01-15 DIAGNOSIS — H18593 Other hereditary corneal dystrophies, bilateral: Secondary | ICD-10-CM | POA: Diagnosis not present

## 2024-01-15 DIAGNOSIS — H40013 Open angle with borderline findings, low risk, bilateral: Secondary | ICD-10-CM | POA: Diagnosis not present

## 2024-01-16 DIAGNOSIS — K080 Exfoliation of teeth due to systemic causes: Secondary | ICD-10-CM | POA: Diagnosis not present

## 2024-02-03 ENCOUNTER — Ambulatory Visit
Admission: RE | Admit: 2024-02-03 | Discharge: 2024-02-03 | Disposition: A | Discharge status: Home / Self Care | Payer: Medicare Other | Source: Ambulatory Visit | Attending: Family Medicine | Admitting: Family Medicine

## 2024-02-03 DIAGNOSIS — Z1231 Encounter for screening mammogram for malignant neoplasm of breast: Secondary | ICD-10-CM | POA: Diagnosis not present

## 2024-02-06 ENCOUNTER — Other Ambulatory Visit: Payer: Self-pay | Admitting: Family Medicine

## 2024-02-27 ENCOUNTER — Ambulatory Visit (INDEPENDENT_AMBULATORY_CARE_PROVIDER_SITE_OTHER): Payer: Medicare Other | Admitting: Family Medicine

## 2024-02-27 ENCOUNTER — Encounter: Payer: Self-pay | Admitting: Family Medicine

## 2024-02-27 VITALS — BP 137/79 | HR 77 | Temp 97.8°F | Ht 66.0 in | Wt 161.0 lb

## 2024-02-27 DIAGNOSIS — E039 Hypothyroidism, unspecified: Secondary | ICD-10-CM

## 2024-02-27 DIAGNOSIS — I10 Essential (primary) hypertension: Secondary | ICD-10-CM | POA: Diagnosis not present

## 2024-02-27 DIAGNOSIS — Z7984 Long term (current) use of oral hypoglycemic drugs: Secondary | ICD-10-CM

## 2024-02-27 DIAGNOSIS — E119 Type 2 diabetes mellitus without complications: Secondary | ICD-10-CM | POA: Diagnosis not present

## 2024-02-27 DIAGNOSIS — E7800 Pure hypercholesterolemia, unspecified: Secondary | ICD-10-CM

## 2024-02-27 LAB — POCT GLYCOSYLATED HEMOGLOBIN (HGB A1C)
HbA1c POC (<> result, manual entry): 7.1 % (ref 4.0–5.6)
HbA1c, POC (controlled diabetic range): 7.1 % — AB (ref 0.0–7.0)
HbA1c, POC (prediabetic range): 7.1 % — AB (ref 5.7–6.4)
Hemoglobin A1C: 7.1 % — AB (ref 4.0–5.6)

## 2024-02-27 MED ORDER — ONETOUCH VERIO VI STRP: ORAL_STRIP | 0 refills | Status: DC

## 2024-02-27 MED ORDER — ATORVASTATIN CALCIUM 40 MG PO TABS: 40.0000 mg | ORAL_TABLET | Freq: Every day | ORAL | 3 refills | Status: AC

## 2024-02-27 MED ORDER — RAMIPRIL 5 MG PO CAPS: 5.0000 mg | ORAL_CAPSULE | Freq: Every day | ORAL | 3 refills | Status: AC

## 2024-02-27 NOTE — Patient Instructions (Signed)
   Your A1c was 7.1 today. Keep up the great work!  It was very nice to see you today!   PLEASE NOTE:  If labs were collected or images ordered, we will inform you of  results once we have received them and reviewed. We will contact you either by echart message, or telephone call.     If we ordered any referrals today, please let us  know if you have not heard from their office within the next 2 weeks. You should receive a letter via MyChart confirming if the referral was approved and their office contact information to schedule.

## 2024-02-27 NOTE — Progress Notes (Signed)
 OFFICE VISIT  02/27/2024  CC:  Chief Complaint  Patient presents with   Medical Management of Chronic Issues    Pt is fasting    Patient is a 89 y.o. female who presents for 53-month follow-up diabetes, hypertension, hypothyroidism, and hypercholesterolemia. A/P as of last visit: "1 diabetes without complication, doing well on Jardiance  10 mg a day. POC Hba1c today is 7.1%.   2.  Hypertension, well-controlled on HCTZ 25 mg a day and ramipril  5 mg a day. Electrolytes and creatinine monitoring today.   #3 hypercholesterolemia.  Doing great on Lipitor 40 mg a day. Lipids excellent about 9 months ago. Lipid panel today.    4 hypothyroidism, has been stable on 50 mcg tab, 1 daily except half tab on Tuesday Thursday and Saturday. TSH today."  INTERIM HX: Labs all excellent last visit.  Shannon Greene is feeling well. She is busy tending her yard.  She eats healthy.   Past Medical History:  Diagnosis Date   Corneal dystrophies, hereditary    Diabetes mellitus 10/2010   A1c 6.7%    Diverticulosis    Dry eye syndrome    Hyperlipemia    Hypertension    Hypothyroidism    Left anterior shoulder pain    chronic, suspect biceps tendon etiology   Lumbar spondylosis 05/2016   x-ray   Macular degeneration, bilateral    Osteoarthritis    Presbyopia    Transfusion history 1959   post partum    Past Surgical History:  Procedure Laterality Date   APPENDECTOMY  1959   CATARACT EXTRACTION, BILATERAL     Dr Danley Dusky   CESAREAN SECTION  [phone]; 1959   COLECTOMY     18 inches removed 2002-2003 for Diverticulitis   COLONOSCOPY     Tics,Dr Sam --pt declines any further screening as of 12/2015.   DEXA  06/30/2016;10/13/19   2017 T score 2.5.  Rpt 2020 NORMAL.  01/2022 T score -1.2   VAGINAL HYSTERECTOMY  1975   For Fibroids (no BSO)    Outpatient Medications Prior to Visit  Medication Sig Dispense Refill   blood glucose meter kit and supplies KIT Use to check blood sugar once daily. 1  each 0   cycloSPORINE (RESTASIS) 0.05 % ophthalmic emulsion Place 1 drop into both eyes 2 (two) times daily.     empagliflozin  (JARDIANCE ) 10 MG TABS tablet Take 1 tablet (10 mg total) by mouth daily. 90 tablet 1   hydrochlorothiazide  (HYDRODIURIL ) 25 MG tablet Take 1 tablet (25 mg total) by mouth daily. 90 tablet 3   Lancets (ONETOUCH DELICA PLUS LANCET33G) MISC Apply topically daily. Use to check blood sugar once daily.     levothyroxine  (SYNTHROID ) 50 MCG tablet TAKE 1 TABLET EVERY DAY EXCEPT TAKE 1/2 TABLET ON TUESDAY, THURSDAY, AND SATURDAY 72 tablet 3   metroNIDAZOLE  (METROCREAM ) 0.75 % cream Apply topically 2 (two) times daily. (Patient not taking: Reported on 02/27/2024) 45 g 6   atorvastatin  (LIPITOR) 40 MG tablet Take 1 tablet (40 mg total) by mouth daily. 90 tablet 3   ONETOUCH VERIO test strip USE  STRIP TO CHECK GLUCOSE ONCE DAILY 200 each 0   ramipril  (ALTACE ) 5 MG capsule Take 1 capsule by mouth once daily 30 capsule 0   sulfamethoxazole -trimethoprim  (BACTRIM  DS) 800-160 MG tablet Take 1 tablet by mouth 2 (two) times daily. (Patient not taking: Reported on 11/26/2023) 14 tablet 0   No facility-administered medications prior to visit.    Allergies  Allergen Reactions   Doxycycline   Hyclate Diarrhea   Penicillins     Rash Because of a history of documented adverse serious drug reaction;Medi Alert bracelet  is recommended   Tetanus Toxoid     REACTION: Arm swelling-severe Because of a history of documented adverse serious drug reaction;Medi Alert bracelet  is recommended   Metformin  And Related Nausea And Vomiting and Other (See Comments)    Abdominal pain   Pioglitazone  Other (See Comments)    Weight gain    Review of Systems As per HPI  PE:    02/27/2024    9:28 AM 11/26/2023    9:40 AM 11/14/2023   11:13 AM  Vitals with BMI  Height 5\' 6"     Weight 161 lbs 165 lbs 6 oz 163 lbs 10 oz  BMI 26    Systolic 137 126 440  Diastolic 79 78 83  Pulse 77 78 80      Physical Exam  Gen: Alert, well appearing.  Patient is oriented to person, place, time, and situation. AFFECT: pleasant, lucid thought and speech. EXT: no rash, erythema, or edema.  LABS:  Last CBC Lab Results  Component Value Date   WBC 5.7 02/21/2023   HGB 13.8 02/21/2023   HCT 42.0 02/21/2023   MCV 93.0 02/21/2023   RDW 13.4 02/21/2023   PLT 199.0 02/21/2023   Last metabolic panel Lab Results  Component Value Date   GLUCOSE 121 (H) 11/26/2023   NA 142 11/26/2023   K 4.6 11/26/2023   CL 100 11/26/2023   CO2 34 (H) 11/26/2023   BUN 17 11/26/2023   CREATININE 0.74 11/26/2023   GFR 72.68 11/26/2023   CALCIUM  9.7 11/26/2023   PROT 6.1 02/21/2023   ALBUMIN 3.7 02/21/2023   BILITOT 0.7 02/21/2023   ALKPHOS 62 02/21/2023   AST 18 02/21/2023   ALT 17 02/21/2023   Last lipids Lab Results  Component Value Date   CHOL 165 11/26/2023   HDL 57.90 11/26/2023   LDLCALC 85 11/26/2023   TRIG 110.0 11/26/2023   CHOLHDL 3 11/26/2023   Last hemoglobin A1c Lab Results  Component Value Date   HGBA1C 7.1 (A) 02/27/2024   HGBA1C 7.1 02/27/2024   HGBA1C 7.1 (A) 02/27/2024   HGBA1C 7.1 (A) 02/27/2024   Last thyroid  functions Lab Results  Component Value Date   TSH 0.72 11/26/2023   IMPRESSION AND PLAN:  1 diabetes without complication. POC Hba1c today is stable at 7.1%.  Increase jardiance  to TWO of the 10mg  tabs daily.  When she is about to run out of her 10 mg tabs she will let us  know and I will send in a prescription for Jardiance  25 mg to take 1 a day.  2.  Hypertension, well-controlled on HCTZ 25 mg a day and ramipril  5 mg a day. Electrolytes and creatinine have been normal and stable over the last 18 months at least. Will recheck these at next follow-up in 3 months.   #3 hypercholesterolemia.  Doing great on Lipitor 40 mg a day. LDL was 85 about 3 months ago. Plan repeat lipids and hepatic panel in 3 months..  If LDL not less than 70 at that time we will  increase her Lipitor.  4 hypothyroidism, has been stable on 50 mcg tab, 1 daily except half tab on Tuesday Thursday and Saturday. TSH normal (0.72) about 3 months ago. Plan recheck TSH in about 9 months.  An After Visit Summary was printed and given to the patient.  FOLLOW UP: Return in about 3  months (around 05/28/2024) for routine chronic illness f/u.  Signed:  Arletha Lady, MD           02/27/2024

## 2024-03-04 ENCOUNTER — Other Ambulatory Visit: Payer: Self-pay

## 2024-03-04 DIAGNOSIS — E119 Type 2 diabetes mellitus without complications: Secondary | ICD-10-CM

## 2024-03-04 MED ORDER — ONETOUCH VERIO VI STRP: ORAL_STRIP | 5 refills | Status: AC

## 2024-03-09 ENCOUNTER — Other Ambulatory Visit: Payer: Self-pay

## 2024-03-09 MED ORDER — EMPAGLIFLOZIN 25 MG PO TABS: 25.0000 mg | ORAL_TABLET | Freq: Every day | ORAL | 3 refills | Status: DC

## 2024-03-09 NOTE — Telephone Encounter (Signed)
 Copied from CRM [id number]. Topic: Clinical - Prescription Issue >> Mar 09, 2024  8:49 AM Shannon Greene wrote: Reason for CRM: pt called to advise the provider, that a new prescription for Jauridance 20gm tablet  should be sent to the pharmacy [facility] on [address]. Please call pt back at [phone]   Please confirm qty for new rx

## 2024-04-13 ENCOUNTER — Encounter: Payer: Self-pay | Admitting: Family Medicine

## 2024-04-13 ENCOUNTER — Ambulatory Visit (INDEPENDENT_AMBULATORY_CARE_PROVIDER_SITE_OTHER): Admitting: Family Medicine

## 2024-04-13 VITALS — BP 127/77 | HR 71 | Temp 98.9°F | Wt 160.2 lb

## 2024-04-13 DIAGNOSIS — S81811A Laceration without foreign body, right lower leg, initial encounter: Secondary | ICD-10-CM

## 2024-04-13 DIAGNOSIS — S51811A Laceration without foreign body of right forearm, initial encounter: Secondary | ICD-10-CM

## 2024-04-13 NOTE — Progress Notes (Signed)
 OFFICE VISIT  04/13/2024  CC:  Chief Complaint  Patient presents with   Abrasion    Scraped right arm and right leg on car door     Patient is a 89 y.o. female who presents for skin tear on right forearm and right calf.  HPI: A couple of days ago she was getting out of her truck and bumped her right elbow and scraped her right calf. She applied a few Band-Aids over each spot and these have been in place since they occurred.  She otherwise feels fine.   Past Medical History:  Diagnosis Date   Corneal dystrophies, hereditary    Diabetes mellitus 10/2010   A1c 6.7%    Diverticulosis    Dry eye syndrome    Hyperlipemia    Hypertension    Hypothyroidism    Left anterior shoulder pain    chronic, suspect biceps tendon etiology   Lumbar spondylosis 05/2016   x-ray   Macular degeneration, bilateral    Osteoarthritis    Presbyopia    Transfusion history 1959   post partum    Past Surgical History:  Procedure Laterality Date   APPENDECTOMY  1959   CATARACT EXTRACTION, BILATERAL     Dr Danley Dusky   CESAREAN SECTION  [phone]; 1959   COLECTOMY     18 inches removed 2002-2003 for Diverticulitis   COLONOSCOPY     Tics,Dr Sam [place]--pt declines any further screening as of 12/2015.   DEXA  06/30/2016;10/13/19   2017 T score 2.5.  Rpt 2020 NORMAL.  01/2022 T score -1.2   VAGINAL HYSTERECTOMY  1975   For Fibroids (no BSO)    Outpatient Medications Prior to Visit  Medication Sig Dispense Refill   atorvastatin  (LIPITOR) 40 MG tablet Take 1 tablet (40 mg total) by mouth daily. 90 tablet 3   blood glucose meter kit and supplies KIT Use to check blood sugar once daily. 1 each 0   cycloSPORINE (RESTASIS) 0.05 % ophthalmic emulsion Place 1 drop into both eyes 2 (two) times daily.     empagliflozin  (JARDIANCE ) 25 MG TABS tablet Take 1 tablet (25 mg total) by mouth daily before breakfast. 90 tablet 3   glucose blood (ONETOUCH VERIO) test strip Use to check glucose twice daily 200 each 5    hydrochlorothiazide  (HYDRODIURIL ) 25 MG tablet Take 1 tablet (25 mg total) by mouth daily. 90 tablet 3   Lancets (ONETOUCH DELICA PLUS LANCET33G) MISC Apply topically daily. Use to check blood sugar once daily.     levothyroxine  (SYNTHROID ) 50 MCG tablet TAKE 1 TABLET EVERY DAY EXCEPT TAKE 1/2 TABLET ON TUESDAY, THURSDAY, AND SATURDAY 72 tablet 3   metroNIDAZOLE  (METROCREAM ) 0.75 % cream Apply topically 2 (two) times daily. 45 g 6   ramipril  (ALTACE ) 5 MG capsule Take 1 capsule (5 mg total) by mouth daily. 90 capsule 3   No facility-administered medications prior to visit.    Allergies  Allergen Reactions   Doxycycline  Hyclate Diarrhea   Penicillins     Rash Because of a history of documented adverse serious drug reaction;Medi Alert bracelet  is recommended   Tetanus Toxoid     REACTION: Arm swelling-severe Because of a history of documented adverse serious drug reaction;Medi Alert bracelet  is recommended   Metformin  And Related Nausea And Vomiting and Other (See Comments)    Abdominal pain   Pioglitazone  Other (See Comments)    Weight gain    Review of Systems  As per HPI  PE:  04/13/2024    4:02 PM 02/27/2024    9:28 AM 11/26/2023    9:40 AM  Vitals with BMI  Height  5\' 6"    Weight 160 lbs 3 oz 161 lbs 165 lbs 6 oz  BMI  26   Systolic 127 137 811  Diastolic 77 79 78  Pulse 71 77 78     Physical Exam  General: Alert and well-appearing. Pleasant. 2 cm skin tear right forearm near the elbow and a similar one on the right calf.  No active bleeding, no exudate, no surrounding erythema.  LABS:  Last CBC Lab Results  Component Value Date   WBC 5.7 02/21/2023   HGB 13.8 02/21/2023   HCT 42.0 02/21/2023   MCV 93.0 02/21/2023   RDW 13.4 02/21/2023   PLT 199.0 02/21/2023     Chemistry      Component Value Date/Time   NA 142 11/26/2023 1019   K 4.6 11/26/2023 1019   CL 100 11/26/2023 1019   CO2 34 (H) 11/26/2023 1019   BUN 17 11/26/2023 1019   CREATININE 0.74  11/26/2023 1019      Component Value Date/Time   CALCIUM  9.7 11/26/2023 1019   ALKPHOS 62 02/21/2023 0909   AST 18 02/21/2023 0909   ALT 17 02/21/2023 0909   BILITOT 0.7 02/21/2023 0909     Lab Results  Component Value Date   HGBA1C 7.1 (A) 02/27/2024   HGBA1C 7.1 02/27/2024   HGBA1C 7.1 (A) 02/27/2024   HGBA1C 7.1 (A) 02/27/2024   IMPRESSION AND PLAN:  Skin tear right forearm and right calf. No sign of infection. Wound care reviewed today.  New dressing applied. Signs/symptoms to call or return for were reviewed and pt expressed understanding.  An After Visit Summary was printed and given to the patient.  FOLLOW UP: Return if symptoms worsen or fail to improve.  Signed:  Arletha Lady, MD           04/13/2024

## 2024-05-07 ENCOUNTER — Encounter: Payer: Self-pay | Admitting: Family Medicine

## 2024-05-07 ENCOUNTER — Ambulatory Visit: Admitting: Family Medicine

## 2024-05-07 VITALS — BP 127/77 | HR 90 | Temp 97.6°F | Ht 66.0 in | Wt 159.4 lb

## 2024-05-07 DIAGNOSIS — S80811A Abrasion, right lower leg, initial encounter: Secondary | ICD-10-CM | POA: Diagnosis not present

## 2024-05-07 DIAGNOSIS — L03115 Cellulitis of right lower limb: Secondary | ICD-10-CM

## 2024-05-07 MED ORDER — SULFAMETHOXAZOLE-TRIMETHOPRIM 800-160 MG PO TABS: 1.0000 | ORAL_TABLET | Freq: Two times a day (BID) | ORAL | 0 refills | Status: AC

## 2024-05-07 NOTE — Progress Notes (Signed)
 OFFICE VISIT  05/14/2024  CC:  Chief Complaint  Patient presents with   Knee Abrasion    Right; injury on last Friday, open wound.    Patient is a 89 y.o. female who presents for right knee abrasion.  HPI: 1 week ago bumped into a piece of furniture in her house and sustained a small abrasion on the anterior surface of the right lower leg.  It has gradually turned a reddish color around it.  Past Medical History:  Diagnosis Date   Corneal dystrophies, hereditary    Diabetes mellitus 10/2010   A1c 6.7%    Diverticulosis    Dry eye syndrome    Hyperlipemia    Hypertension    Hypothyroidism    Left anterior shoulder pain    chronic, suspect biceps tendon etiology   Lumbar spondylosis 05/2016   x-ray   Macular degeneration, bilateral    Osteoarthritis    Presbyopia    Transfusion history 1959   post partum    Past Surgical History:  Procedure Laterality Date   APPENDECTOMY  1959   CATARACT EXTRACTION, BILATERAL     Dr Camillo   CESAREAN SECTION  [phone]; 1959   COLECTOMY     18 inches removed 2002-2003 for Diverticulitis   COLONOSCOPY     Tics,Dr Sam Karnak--pt declines any further screening as of 12/2015.   DEXA  06/30/2016;10/13/19   2017 T score 2.5.  Rpt 2020 NORMAL.  01/2022 T score -1.2   VAGINAL HYSTERECTOMY  1975   For Fibroids (no BSO)    Outpatient Medications Prior to Visit  Medication Sig Dispense Refill   atorvastatin  (LIPITOR) 40 MG tablet Take 1 tablet (40 mg total) by mouth daily. 90 tablet 3   blood glucose meter kit and supplies KIT Use to check blood sugar once daily. 1 each 0   cycloSPORINE (RESTASIS) 0.05 % ophthalmic emulsion Place 1 drop into both eyes 2 (two) times daily.     empagliflozin  (JARDIANCE ) 25 MG TABS tablet Take 1 tablet (25 mg total) by mouth daily before breakfast. 90 tablet 3   glucose blood (ONETOUCH VERIO) test strip Use to check glucose twice daily 200 each 5   hydrochlorothiazide  (HYDRODIURIL ) 25 MG tablet Take 1 tablet (25 mg  total) by mouth daily. 90 tablet 3   Lancets (ONETOUCH DELICA PLUS LANCET33G) MISC Apply topically daily. Use to check blood sugar once daily.     levothyroxine  (SYNTHROID ) 50 MCG tablet TAKE 1 TABLET EVERY DAY EXCEPT TAKE 1/2 TABLET ON TUESDAY, THURSDAY, AND SATURDAY 72 tablet 3   metroNIDAZOLE  (METROCREAM ) 0.75 % cream Apply topically 2 (two) times daily. 45 g 6   ramipril  (ALTACE ) 5 MG capsule Take 1 capsule (5 mg total) by mouth daily. 90 capsule 3   No facility-administered medications prior to visit.    Allergies  Allergen Reactions   Doxycycline  Hyclate Diarrhea   Penicillins     Rash Because of a history of documented adverse serious drug reaction;Medi Alert bracelet  is recommended   Tetanus Toxoid     REACTION: Arm swelling-severe Because of a history of documented adverse serious drug reaction;Medi Alert bracelet  is recommended   Metformin  And Related Nausea And Vomiting and Other (See Comments)    Abdominal pain   Pioglitazone  Other (See Comments)    Weight gain    Review of Systems  As per HPI  PE:    05/07/2024    2:47 PM 04/13/2024    4:02 PM 02/27/2024  9:28 AM  Vitals with BMI  Height 5' 6  5' 6  Weight 159 lbs 6 oz 160 lbs 3 oz 161 lbs  BMI 25.74  26  Systolic 127 127 862  Diastolic 77 77 79  Pulse 90 71 77     Physical Exam  Gen: Alert, well appearing.  Patient is oriented to person, place, time, and situation. AFFECT: pleasant, lucid thought and speech. Right leg pretibial region with 2 small superficial abrasions with some surrounding erythema.  Mildly tender to touch.  No fluctuance or streaking.  LABS:  Last CBC Lab Results  Component Value Date   WBC 5.7 02/21/2023   HGB 13.8 02/21/2023   HCT 42.0 02/21/2023   MCV 93.0 02/21/2023   RDW 13.4 02/21/2023   PLT 199.0 02/21/2023   Last metabolic panel Lab Results  Component Value Date   GLUCOSE 121 (H) 11/26/2023   NA 142 11/26/2023   K 4.6 11/26/2023   CL 100 11/26/2023   CO2 34  (H) 11/26/2023   BUN 17 11/26/2023   CREATININE 0.74 11/26/2023   GFR 72.68 11/26/2023   CALCIUM  9.7 11/26/2023   PROT 6.1 02/21/2023   ALBUMIN 3.7 02/21/2023   BILITOT 0.7 02/21/2023   ALKPHOS 62 02/21/2023   AST 18 02/21/2023   ALT 17 02/21/2023   IMPRESSION AND PLAN:  Right lower leg abrasion with surrounding cellulitis. Bactrim  double strength, 1 twice daily x 10 days (multiple antibiotic allergies).  An After Visit Summary was printed and given to the patient.  FOLLOW UP: Return in about 1 week (around 05/14/2024) for f/u leg abrasion/cellulitis.  Signed:  Gerlene Hockey, MD           05/14/2024

## 2024-05-11 ENCOUNTER — Ambulatory Visit: Admitting: Family Medicine

## 2024-05-14 ENCOUNTER — Encounter: Payer: Self-pay | Admitting: Family Medicine

## 2024-05-14 ENCOUNTER — Ambulatory Visit: Admitting: Family Medicine

## 2024-05-14 VITALS — BP 119/76 | HR 82 | Temp 97.6°F | Ht 66.0 in | Wt 157.4 lb

## 2024-05-14 DIAGNOSIS — L03115 Cellulitis of right lower limb: Secondary | ICD-10-CM

## 2024-05-14 DIAGNOSIS — S80811D Abrasion, right lower leg, subsequent encounter: Secondary | ICD-10-CM | POA: Diagnosis not present

## 2024-05-14 NOTE — Progress Notes (Signed)
 OFFICE VISIT  05/14/2024  CC:  Chief Complaint  Patient presents with   Follow-up    Leg abrasion; 1 week    Patient is a 89 y.o. female who presents for 1 week follow-up for right lower leg abrasion with cellulitis. Started Bactrim  last visit.  INTERIM HX:  Doing a lot better.  The redness has nearly completely resolved and her wounds are dry. The Bactrim  causes bloating so she can only tolerate one half tab twice a day.  Past Medical History:  Diagnosis Date   Corneal dystrophies, hereditary    Diabetes mellitus 10/2010   A1c 6.7%    Diverticulosis    Dry eye syndrome    Hyperlipemia    Hypertension    Hypothyroidism    Left anterior shoulder pain    chronic, suspect biceps tendon etiology   Lumbar spondylosis 05/2016   x-ray   Macular degeneration, bilateral    Osteoarthritis    Presbyopia    Transfusion history 1959   post partum    Past Surgical History:  Procedure Laterality Date   APPENDECTOMY  1959   CATARACT EXTRACTION, BILATERAL     Dr Camillo   CESAREAN SECTION  [phone]; 1959   COLECTOMY     18 inches removed 2002-2003 for Diverticulitis   COLONOSCOPY     Tics,Dr Sam Wade--pt declines any further screening as of 12/2015.   DEXA  06/30/2016;10/13/19   2017 T score 2.5.  Rpt 2020 NORMAL.  01/2022 T score -1.2   VAGINAL HYSTERECTOMY  1975   For Fibroids (no BSO)    Outpatient Medications Prior to Visit  Medication Sig Dispense Refill   atorvastatin  (LIPITOR) 40 MG tablet Take 1 tablet (40 mg total) by mouth daily. 90 tablet 3   blood glucose meter kit and supplies KIT Use to check blood sugar once daily. 1 each 0   cycloSPORINE (RESTASIS) 0.05 % ophthalmic emulsion Place 1 drop into both eyes 2 (two) times daily.     empagliflozin  (JARDIANCE ) 25 MG TABS tablet Take 1 tablet (25 mg total) by mouth daily before breakfast. 90 tablet 3   glucose blood (ONETOUCH VERIO) test strip Use to check glucose twice daily 200 each 5   hydrochlorothiazide   (HYDRODIURIL ) 25 MG tablet Take 1 tablet (25 mg total) by mouth daily. 90 tablet 3   Lancets (ONETOUCH DELICA PLUS LANCET33G) MISC Apply topically daily. Use to check blood sugar once daily.     levothyroxine  (SYNTHROID ) 50 MCG tablet TAKE 1 TABLET EVERY DAY EXCEPT TAKE 1/2 TABLET ON TUESDAY, THURSDAY, AND SATURDAY 72 tablet 3   metroNIDAZOLE  (METROCREAM ) 0.75 % cream Apply topically 2 (two) times daily. 45 g 6   ramipril  (ALTACE ) 5 MG capsule Take 1 capsule (5 mg total) by mouth daily. 90 capsule 3   sulfamethoxazole -trimethoprim  (BACTRIM  DS) 800-160 MG tablet Take 1 tablet by mouth 2 (two) times daily for 10 days. (Patient taking differently: Take 0.5 tablets by mouth 2 (two) times daily.) 20 tablet 0   No facility-administered medications prior to visit.    Allergies  Allergen Reactions   Doxycycline  Hyclate Diarrhea   Penicillins     Rash Because of a history of documented adverse serious drug reaction;Medi Alert bracelet  is recommended   Tetanus Toxoid     REACTION: Arm swelling-severe Because of a history of documented adverse serious drug reaction;Medi Alert bracelet  is recommended   Metformin  And Related Nausea And Vomiting and Other (See Comments)    Abdominal pain  Pioglitazone  Other (See Comments)    Weight gain    Review of Systems As per HPI  PE:    05/14/2024    2:20 PM 05/07/2024    2:47 PM 04/13/2024    4:02 PM  Vitals with BMI  Height 5' 6 5' 6   Weight 157 lbs 6 oz 159 lbs 6 oz 160 lbs 3 oz  BMI 25.42 25.74   Systolic 119 127 872  Diastolic 76 77 77  Pulse 82 90 71     Physical Exam  Gen: Alert, well appearing.  Patient is oriented to person, place, time, and situation. Right pretibial surface with 2 small scabbed abrasions and no surrounding erythema.  No tenderness.  No warmth.  LABS:  Last CBC Lab Results  Component Value Date   WBC 5.7 02/21/2023   HGB 13.8 02/21/2023   HCT 42.0 02/21/2023   MCV 93.0 02/21/2023   RDW 13.4 02/21/2023    PLT 199.0 02/21/2023   Last metabolic panel Lab Results  Component Value Date   GLUCOSE 121 (H) 11/26/2023   NA 142 11/26/2023   K 4.6 11/26/2023   CL 100 11/26/2023   CO2 34 (H) 11/26/2023   BUN 17 11/26/2023   CREATININE 0.74 11/26/2023   GFR 72.68 11/26/2023   CALCIUM  9.7 11/26/2023   PROT 6.1 02/21/2023   ALBUMIN 3.7 02/21/2023   BILITOT 0.7 02/21/2023   ALKPHOS 62 02/21/2023   AST 18 02/21/2023   ALT 17 02/21/2023    IMPRESSION AND PLAN:  Right lower leg healing abrasions, cellulitis resolved. She can finish out her Bactrim  at half tab twice a day  An After Visit Summary was printed and given to the patient.  FOLLOW UP: Return if symptoms worsen or fail to improve.  Signed:  Gerlene Hockey, MD           05/14/2024

## 2024-05-28 ENCOUNTER — Encounter: Payer: Self-pay | Admitting: Family Medicine

## 2024-05-28 ENCOUNTER — Ambulatory Visit: Admitting: Family Medicine

## 2024-05-28 ENCOUNTER — Ambulatory Visit: Payer: Self-pay | Admitting: Family Medicine

## 2024-05-28 VITALS — BP 126/77 | HR 80 | Temp 98.5°F | Ht 66.0 in | Wt 156.0 lb

## 2024-05-28 DIAGNOSIS — E7800 Pure hypercholesterolemia, unspecified: Secondary | ICD-10-CM

## 2024-05-28 DIAGNOSIS — Z7984 Long term (current) use of oral hypoglycemic drugs: Secondary | ICD-10-CM

## 2024-05-28 DIAGNOSIS — E039 Hypothyroidism, unspecified: Secondary | ICD-10-CM | POA: Diagnosis not present

## 2024-05-28 DIAGNOSIS — I10 Essential (primary) hypertension: Secondary | ICD-10-CM | POA: Diagnosis not present

## 2024-05-28 DIAGNOSIS — E119 Type 2 diabetes mellitus without complications: Secondary | ICD-10-CM | POA: Diagnosis not present

## 2024-05-28 LAB — COMPREHENSIVE METABOLIC PANEL WITH GFR
ALT: 20 U/L (ref 0–35)
AST: 20 U/L (ref 0–37)
Albumin: 4.1 g/dL (ref 3.5–5.2)
Alkaline Phosphatase: 61 U/L (ref 39–117)
BUN: 19 mg/dL (ref 6–23)
CO2: 34 meq/L — ABNORMAL HIGH (ref 19–32)
Calcium: 9.5 mg/dL (ref 8.4–10.5)
Chloride: 98 meq/L (ref 96–112)
Creatinine, Ser: 0.84 mg/dL (ref 0.40–1.20)
GFR: 62.2 mL/min (ref >60.00)
Glucose, Bld: 119 mg/dL — ABNORMAL HIGH (ref 70–99)
Potassium: 4.2 meq/L (ref 3.5–5.1)
Sodium: 140 meq/L (ref 135–145)
Total Bilirubin: 0.9 mg/dL (ref 0.2–1.2)
Total Protein: 6.4 g/dL (ref 6.0–8.3)

## 2024-05-28 LAB — LIPID PANEL
Cholesterol: 162 mg/dL (ref 0–200)
HDL: 61.7 mg/dL (ref >39.00)
LDL Cholesterol: 86 mg/dL (ref 0–99)
NonHDL: 100.16
Total CHOL/HDL Ratio: 3
Triglycerides: 73 mg/dL (ref 0.0–149.0)
VLDL: 14.6 mg/dL (ref 0.0–40.0)

## 2024-05-28 LAB — POCT GLYCOSYLATED HEMOGLOBIN (HGB A1C)
HbA1c POC (<> result, manual entry): 6.8 % (ref 4.0–5.6)
HbA1c, POC (controlled diabetic range): 6.8 % (ref 0.0–7.0)
HbA1c, POC (prediabetic range): 6.8 % — AB (ref 5.7–6.4)
Hemoglobin A1C: 6.8 % — AB (ref 4.0–5.6)

## 2024-05-28 NOTE — Progress Notes (Signed)
 OFFICE VISIT  05/28/2024  CC:  Chief Complaint  Patient presents with   Medical Management of Chronic Issues    Pt is fasting    Patient is a 89 y.o. female who presents for 74-month follow-up diabetes, hypertension, hypercholesterolemia, and hypothyroidism. A/P as of last visit: 1 diabetes without complication. POC Hba1c today is stable at 7.1%.  Increase jardiance  to TWO of the 10mg  tabs daily.  When she is about to run out of her 10 mg tabs she will let us  know and I will send in a prescription for Jardiance  25 mg to take 1 a day.   2.  Hypertension, well-controlled on HCTZ 25 mg a day and ramipril  5 mg a day. Electrolytes and creatinine have been normal and stable over the last 18 months at least. Will recheck these at next follow-up in 3 months.   #3 hypercholesterolemia.  Doing great on Lipitor 40 mg a day. LDL was 85 about 3 months ago. Plan repeat lipids and hepatic panel in 3 months..  If LDL not less than 70 at that time we will increase her Lipitor.   4 hypothyroidism, has been stable on 50 mcg tab, 1 daily except half tab on Tuesday Thursday and Saturday. TSH normal (0.72) about 3 months ago. Plan recheck TSH in about 9 months.  INTERIM HX: Feeling well. She eats a great diabetic diet and remains very active.  ROS --> no fevers, no CP, no SOB, no wheezing, no cough, no dizziness, no HAs, no rashes, no melena/hematochezia.  No polyuria or polydipsia.  No myalgias or arthralgias.  No focal weakness, paresthesias, or tremors.  No acute vision or hearing abnormalities.  No dysuria or unusual/new urinary urgency or frequency.  No recent changes in lower legs. No n/v/d or abd pain.  No palpitations.     Past Medical History:  Diagnosis Date   Corneal dystrophies, hereditary    Diabetes mellitus 10/2010   A1c 6.7%    Diverticulosis    Dry eye syndrome    Hyperlipemia    Hypertension    Hypothyroidism    Left anterior shoulder pain    chronic, suspect biceps  tendon etiology   Lumbar spondylosis 05/2016   x-ray   Macular degeneration, bilateral    Osteoarthritis    Presbyopia    Transfusion history 1959   post partum    Past Surgical History:  Procedure Laterality Date   APPENDECTOMY  1959   CATARACT EXTRACTION, BILATERAL     Dr Camillo   CESAREAN SECTION  [phone]; 1959   COLECTOMY     18 inches removed 2002-2003 for Diverticulitis   COLONOSCOPY     Tics,Dr Sam [place]--pt declines any further screening as of 12/2015.   DEXA  06/30/2016;10/13/19   2017 T score 2.5.  Rpt 2020 NORMAL.  01/2022 T score -1.2   VAGINAL HYSTERECTOMY  1975   For Fibroids (no BSO)    Outpatient Medications Prior to Visit  Medication Sig Dispense Refill   atorvastatin  (LIPITOR) 40 MG tablet Take 1 tablet (40 mg total) by mouth daily. 90 tablet 3   blood glucose meter kit and supplies KIT Use to check blood sugar once daily. 1 each 0   cycloSPORINE (RESTASIS) 0.05 % ophthalmic emulsion Place 1 drop into both eyes 2 (two) times daily.     empagliflozin  (JARDIANCE ) 25 MG TABS tablet Take 1 tablet (25 mg total) by mouth daily before breakfast. 90 tablet 3   glucose blood (ONETOUCH VERIO) test strip  Use to check glucose twice daily 200 each 5   hydrochlorothiazide  (HYDRODIURIL ) 25 MG tablet Take 1 tablet (25 mg total) by mouth daily. 90 tablet 3   Lancets (ONETOUCH DELICA PLUS LANCET33G) MISC Apply topically daily. Use to check blood sugar once daily.     levothyroxine  (SYNTHROID ) 50 MCG tablet TAKE 1 TABLET EVERY DAY EXCEPT TAKE 1/2 TABLET ON TUESDAY, THURSDAY, AND SATURDAY 72 tablet 3   metroNIDAZOLE  (METROCREAM ) 0.75 % cream Apply topically 2 (two) times daily. 45 g 6   ramipril  (ALTACE ) 5 MG capsule Take 1 capsule (5 mg total) by mouth daily. 90 capsule 3   No facility-administered medications prior to visit.    Allergies  Allergen Reactions   Doxycycline  Hyclate Diarrhea   Penicillins     Rash Because of a history of documented adverse serious drug  reaction;Medi Alert bracelet  is recommended   Tetanus Toxoid     REACTION: Arm swelling-severe Because of a history of documented adverse serious drug reaction;Medi Alert bracelet  is recommended   Metformin  And Related Nausea And Vomiting and Other (See Comments)    Abdominal pain   Pioglitazone  Other (See Comments)    Weight gain    Review of Systems As per HPI  PE:    05/28/2024    9:37 AM 05/14/2024    2:20 PM 05/07/2024    2:47 PM  Vitals with BMI  Height 5' 6 5' 6 5' 6  Weight 156 lbs 157 lbs 6 oz 159 lbs 6 oz  BMI 25.19 25.42 25.74  Systolic 126 119 872  Diastolic 77 76 77  Pulse 80 82 90     Physical Exam  Gen: Alert, well appearing.  Patient is oriented to person, place, time, and situation. AFFECT: pleasant, lucid thought and speech. CV: RRR, no m/r/g.   LUNGS: CTA bilat, nonlabored resps, good aeration in all lung fields. EXT: no clubbing or cyanosis.  no edema.  No rash.   LABS:  Last CBC Lab Results  Component Value Date   WBC 5.7 02/21/2023   HGB 13.8 02/21/2023   HCT 42.0 02/21/2023   MCV 93.0 02/21/2023   RDW 13.4 02/21/2023   PLT 199.0 02/21/2023   Last metabolic panel Lab Results  Component Value Date   GLUCOSE 121 (H) 11/26/2023   NA 142 11/26/2023   K 4.6 11/26/2023   CL 100 11/26/2023   CO2 34 (H) 11/26/2023   BUN 17 11/26/2023   CREATININE 0.74 11/26/2023   GFR 72.68 11/26/2023   CALCIUM  9.7 11/26/2023   PROT 6.1 02/21/2023   ALBUMIN 3.7 02/21/2023   BILITOT 0.7 02/21/2023   ALKPHOS 62 02/21/2023   AST 18 02/21/2023   ALT 17 02/21/2023   Last lipids Lab Results  Component Value Date   CHOL 165 11/26/2023   HDL 57.90 11/26/2023   LDLCALC 85 11/26/2023   TRIG 110.0 11/26/2023   CHOLHDL 3 11/26/2023   Last hemoglobin A1c Lab Results  Component Value Date   HGBA1C 6.8 (A) 05/28/2024   HGBA1C 6.8 05/28/2024   HGBA1C 6.8 (A) 05/28/2024   HGBA1C 6.8 05/28/2024   Last thyroid  functions Lab Results  Component Value  Date   TSH 0.72 11/26/2023   IMPRESSION AND PLAN:  #1 diabetes without complication.  Well-controlled. Point-of-care hemoglobin A1c today is 6.8 percent Continue Jardiance  25 mg a day. Monitor electrolytes and renal function today.  #2 hypertension, well-controlled on ramipril  5 mg daily and hydrochlorothiazide  25 mg daily. Monitor electrolytes and creatinine  today.  3.  Hypercholesterolemia, doing well on atorvastatin  40 mg a day. LDL was 85 about 6 months ago. Lipid panel and hepatic panel today.  4.  Hypothyroidism, has been stable on levothyroxine  50 mcg 4 days a week and 25 mcg 3 days a week. Monitor TSH in about 6 months.  An After Visit Summary was printed and given to the patient.  FOLLOW UP: Return in about 3 months (around 08/28/2024) for routine chronic illness f/u.  Signed:  Gerlene Hockey, MD           05/28/2024

## 2024-06-18 ENCOUNTER — Telehealth: Payer: Self-pay

## 2024-06-18 NOTE — Telephone Encounter (Signed)
 I can't think of why jardiance  would suddenly start causing her pain. Anyway, it is OK to stop it to see if her pain resolves completely. If it does not resolve within a couple of days then she can call and I'll work her in.

## 2024-06-18 NOTE — Telephone Encounter (Signed)
 Pt states that when she takes the medication she has pain in the left side of her. She has had to take milanta by midday when she does take it. Please advise. Pt was advised that appointment would likely be needed by first available is 09/03

## 2024-06-18 NOTE — Telephone Encounter (Signed)
 Copied from CRM [id number]. Topic: Clinical - Medication Question >> Jun 18, 2024 10:23 AM Shannon Greene wrote: Reason for CRM: Patient is requesting a phone call from CMA in regards to if she should take empagliflozin  (JARDIANCE ) 25 MG TABS tablet medication or not?

## 2024-06-19 NOTE — Telephone Encounter (Signed)
 Spoke with patient regarding results/recommendations.

## 2024-06-22 NOTE — Telephone Encounter (Signed)
 Pls get her on my schedule at 11 tomorrow

## 2024-06-22 NOTE — Telephone Encounter (Signed)
 Copied from CRM [id number]. Topic: Clinical - Medication Question >> Jun 18, 2024 10:23 AM Burnard DEL wrote: Reason for CRM: Patient is requesting a phone call from CMA in regards to if she should take empagliflozin  (JARDIANCE ) 25 MG TABS tablet medication or not? >> Jun 22, 2024  8:09 AM [place] E wrote: Patient is requesting a callback from [place] in regards to her Jardiance  medication.

## 2024-06-22 NOTE — Telephone Encounter (Signed)
 Pt states that she did not take meds over the weekend and the pain has not gotten worse but has not gotten better. Please advise

## 2024-06-22 NOTE — Telephone Encounter (Signed)
 Spoke to pt and she will be added to scheduled at 72

## 2024-06-23 ENCOUNTER — Encounter: Payer: Self-pay | Admitting: Family Medicine

## 2024-06-23 ENCOUNTER — Ambulatory Visit (INDEPENDENT_AMBULATORY_CARE_PROVIDER_SITE_OTHER): Admitting: Family Medicine

## 2024-06-23 VITALS — BP 143/77 | HR 81 | Temp 97.9°F | Ht 66.0 in | Wt 156.6 lb

## 2024-06-23 DIAGNOSIS — R1013 Epigastric pain: Secondary | ICD-10-CM

## 2024-06-23 NOTE — Progress Notes (Signed)
 OFFICE VISIT  06/23/2024  CC:  Chief Complaint  Patient presents with   Abdominal Pain    Left side; Sat night and Sunday morning were the worst days of pain. Unsure if medication related, last Jardiance dose Friday    Patient is a 89 y.o. female who presents accompanied by her daughter for abdominal pain. At last follow-up 3 weeks ago she was doing well on Jardiance 25 mg a day, ramipril 5 mg a day, HCTZ 25 mg a day, atorvastatin 40 mg a day, and levothyroxine.   HPI: 6 days ago she started getting abdominal pain in the mid epigastric region.  It was bad for a couple of days.  She had been eating lots of tomatoes. She had no fever, no nausea or vomiting, no diarrhea or constipation.  She took some Pepto-Bismol and this did not help.  Lots of Tums did not help.  Her stool did turn black from the Pepto but she says prior to that it was fine. Over the last couple of days her pain has subsided substantially.  She can pushing on her abdomen that does not hurt. She has been eating a bit more bland food in the last couple of days.  No tomatoes.  Currently on no medications for her stomach.   Past Medical History:  Diagnosis Date   Corneal dystrophies, hereditary    Diabetes mellitus 10/2010   A1c 6.7%    Diverticulosis    Dry eye syndrome    Hyperlipemia    Hypertension    Hypothyroidism    Left anterior shoulder pain    chronic, suspect biceps tendon etiology   Lumbar spondylosis 05/2016   x-ray   Macular degeneration, bilateral    Osteoarthritis    Presbyopia    Transfusion history 1959   post partum    Past Surgical History:  Procedure Laterality Date   APPENDECTOMY  1959   CATARACT EXTRACTION, BILATERAL     Dr Digby   CESAREAN SECTION  1957; 1959   COLECTOMY     18  inches removed 2002-2003 for Diverticulitis   COLONOSCOPY     Tics,Dr Sam [place]--pt declines any further screening as of 12/2015.   DEXA  06/30/2016;10/13/19   2017 T score 2.5.  Rpt 2020 NORMAL.  01/2022  T score -1.2   VAGINAL HYSTERECTOMY  1975   For Fibroids (no BSO)    Outpatient Medications Prior to Visit  Medication Sig Dispense Refill   atorvastatin  (LIPITOR) 40 MG tablet Take 1 tablet (40 mg total) by mouth daily. 90 tablet 3   blood glucose meter kit and supplies KIT Use to check blood sugar once daily. 1 each 0   cycloSPORINE (RESTASIS) 0.05 % ophthalmic emulsion Place 1 drop into both eyes 2 (two) times daily.     glucose blood (ONETOUCH VERIO) test strip Use to check glucose twice daily 200 each 5   hydrochlorothiazide  (HYDRODIURIL ) 25 MG tablet Take 1 tablet (25 mg total) by mouth daily. 90 tablet 3   Lancets (ONETOUCH DELICA PLUS LANCET33G) MISC Apply topically daily. Use to check blood sugar once daily.     levothyroxine  (SYNTHROID ) 50 MCG tablet TAKE 1 TABLET EVERY DAY EXCEPT TAKE 1/2 TABLET ON TUESDAY, THURSDAY, AND SATURDAY 72 tablet 3   metroNIDAZOLE  (METROCREAM ) 0.75 % cream Apply topically 2 (two) times daily. 45 g 6   ramipril  (ALTACE ) 5 MG capsule Take 1 capsule (5 mg total) by mouth daily. 90 capsule 3   empagliflozin  (JARDIANCE ) 25 MG TABS  tablet Take 1 tablet (25 mg total) by mouth daily before breakfast. (Patient not taking: Reported on 06/23/2024) 90 tablet 3   No facility-administered medications prior to visit.    Allergies  Allergen Reactions   Doxycycline  Hyclate Diarrhea   Penicillins     Rash Because of a history of documented adverse serious drug reaction;Medi Alert bracelet  is recommended   Tetanus Toxoid     REACTION: Arm swelling-severe Because of a history of documented adverse serious drug reaction;Medi Alert bracelet  is recommended   Metformin  And Related Nausea And Vomiting and Other (See Comments)    Abdominal pain   Pioglitazone  Other (See Comments)    Weight gain    Review of Systems  As per HPI  PE:    06/23/2024   11:14 AM 06/23/2024   11:10 AM 05/28/2024    9:37 AM  Vitals with BMI  Height  5' 6 5' 6  Weight  156 lbs 10 oz  156 lbs  BMI  25.29 25.19  Systolic 143 145 873  Diastolic 77 84 77  Pulse  81 80     Physical Exam  Gen: Alert, well appearing.  Patient is oriented to person, place, time, and situation. AFFECT: pleasant, lucid thought and speech. CV: RRR, no m/r/g.   LUNGS: CTA bilat, nonlabored resps, good aeration in all lung fields. ABD: soft, NT, ND, BS normal.  No hepatospenomegaly or mass.  No bruits.   LABS:  Last CBC Lab Results  Component Value Date   WBC 5.7 02/21/2023   HGB 13.8 02/21/2023   HCT 42.0 02/21/2023   MCV 93.0 02/21/2023   RDW 13.4 02/21/2023   PLT 199.0 02/21/2023   Last metabolic panel Lab Results  Component Value Date   GLUCOSE 119 (H) 05/28/2024   NA 140 05/28/2024   K 4.2 05/28/2024   CL 98 05/28/2024   CO2 34 (H) 05/28/2024   BUN 19 05/28/2024   CREATININE 0.84 05/28/2024   GFR 62.20 05/28/2024   CALCIUM  9.5 05/28/2024   PROT 6.4 05/28/2024   ALBUMIN 4.1 05/28/2024   BILITOT 0.9 05/28/2024   ALKPHOS 61 05/28/2024   AST 20 05/28/2024   ALT 20 05/28/2024   Last lipids Lab Results  Component Value Date   CHOL 162 05/28/2024   HDL 61.70 05/28/2024   LDLCALC 86 05/28/2024   TRIG 73.0 05/28/2024   CHOLHDL 3 05/28/2024   Last hemoglobin A1c Lab Results  Component Value Date   HGBA1C 6.8 (A) 05/28/2024   HGBA1C 6.8 05/28/2024   HGBA1C 6.8 (A) 05/28/2024   HGBA1C 6.8 05/28/2024   Last thyroid  functions Lab Results  Component Value Date   TSH 0.72 11/26/2023   IMPRESSION AND PLAN:  Epigastric pain, resolving. Dyspepsia/gastritis. She does associate her symptoms with eating too many tomatoes. Encouraged her to go ahead and restart tomatoes in moderation. No medications prescribed today. We had stopped Jardiance  in the midst of the pain but we do not think it actually had anything to do with it so we will restart it today--> 25 mg a day.  An After Visit Summary was printed and given to the patient.  FOLLOW UP: Return if symptoms  worsen or fail to improve.  Signed:  Gerlene Hockey, MD           06/23/2024

## 2024-06-23 NOTE — Patient Instructions (Signed)
 Restart jardiance

## 2024-08-04 DIAGNOSIS — K080 Exfoliation of teeth due to systemic causes: Secondary | ICD-10-CM | POA: Diagnosis not present

## 2024-08-05 DIAGNOSIS — H524 Presbyopia: Secondary | ICD-10-CM | POA: Diagnosis not present

## 2024-08-05 DIAGNOSIS — H353132 Nonexudative age-related macular degeneration, bilateral, intermediate dry stage: Secondary | ICD-10-CM | POA: Diagnosis not present

## 2024-08-05 DIAGNOSIS — H04123 Dry eye syndrome of bilateral lacrimal glands: Secondary | ICD-10-CM | POA: Diagnosis not present

## 2024-08-05 DIAGNOSIS — H52213 Irregular astigmatism, bilateral: Secondary | ICD-10-CM | POA: Diagnosis not present

## 2024-08-05 DIAGNOSIS — H18593 Other hereditary corneal dystrophies, bilateral: Secondary | ICD-10-CM | POA: Diagnosis not present

## 2024-08-05 LAB — HM DIABETES EYE EXAM

## 2024-08-20 ENCOUNTER — Telehealth: Payer: Self-pay

## 2024-08-20 NOTE — Telephone Encounter (Signed)
 Copied from CRM #[id number]. Topic: Clinical - Medical Advice >> Aug 20, 2024 10:22 AM Suzen RAMAN wrote: Reason for CRM: Patient stop taking empagliflozin  (JARDIANCE ) 25 MG TABS tablet on 08/10/24 and would like a call back from [place] or [place] to further discuss.   [phone] (M)

## 2024-08-21 NOTE — Telephone Encounter (Signed)
 Tried calling patient, unable to LVM

## 2024-08-21 NOTE — Telephone Encounter (Signed)
 Tried calling patient, call did not go through.

## 2024-08-24 NOTE — Telephone Encounter (Signed)
 Spoke with patient and experienced left sided pain last week. She stopped Jardiance  on 10/6. She got the flu shot last week, sugars were elevated for 2 days.  Current glucose 120 from today

## 2024-08-24 NOTE — Telephone Encounter (Signed)
 Just FYI.

## 2024-08-24 NOTE — Telephone Encounter (Signed)
 Ok, is this just FYI? I'm not sure what the question is.

## 2024-08-31 ENCOUNTER — Ambulatory Visit: Admitting: Family Medicine

## 2024-09-07 ENCOUNTER — Ambulatory Visit: Admitting: Family Medicine

## 2024-09-07 ENCOUNTER — Encounter: Payer: Self-pay | Admitting: Family Medicine

## 2024-09-07 VITALS — BP 122/73 | HR 74 | Temp 97.7°F | Ht 66.0 in | Wt 156.4 lb

## 2024-09-07 DIAGNOSIS — Z7984 Long term (current) use of oral hypoglycemic drugs: Secondary | ICD-10-CM | POA: Diagnosis not present

## 2024-09-07 DIAGNOSIS — E119 Type 2 diabetes mellitus without complications: Secondary | ICD-10-CM

## 2024-09-07 DIAGNOSIS — I10 Essential (primary) hypertension: Secondary | ICD-10-CM | POA: Diagnosis not present

## 2024-09-07 LAB — BASIC METABOLIC PANEL WITH GFR
BUN: 20 mg/dL (ref 6–23)
CO2: 36 meq/L — ABNORMAL HIGH (ref 19–32)
Calcium: 9.5 mg/dL (ref 8.4–10.5)
Chloride: 98 meq/L (ref 96–112)
Creatinine, Ser: 0.77 mg/dL (ref 0.40–1.20)
GFR: 68.91 mL/min (ref >60.00)
Glucose, Bld: 124 mg/dL — ABNORMAL HIGH (ref 70–99)
Potassium: 4 meq/L (ref 3.5–5.1)
Sodium: 142 meq/L (ref 135–145)

## 2024-09-07 LAB — POCT GLYCOSYLATED HEMOGLOBIN (HGB A1C)
HbA1c POC (<> result, manual entry): 7.3 % (ref 4.0–5.6)
HbA1c, POC (controlled diabetic range): 7.3 % — AB (ref 0.0–7.0)
HbA1c, POC (prediabetic range): 7.3 % — AB (ref 5.7–6.4)
Hemoglobin A1C: 7.3 % — AB (ref 4.0–5.6)

## 2024-09-07 NOTE — Progress Notes (Signed)
 OFFICE VISIT  09/07/2024  CC:  Chief Complaint  Patient presents with   Medical Management of Chronic Issues    Pt is fasting    Patient is a 89 y.o. female who presents for 53-month follow-up diabetes, hypertension, hypercholesterolemia, and hypothyroidism. A/P as of last visit: #1 diabetes without complication.  Well-controlled. Point-of-care hemoglobin A1c today is 6.8 percent Continue Jardiance  25 mg a day. Monitor electrolytes and renal function today.   #2 hypertension, well-controlled on ramipril  5 mg daily and hydrochlorothiazide  25 mg daily. Monitor electrolytes and creatinine today.   3.  Hypercholesterolemia, doing well on atorvastatin  40 mg a day. LDL was 85 about 6 months ago. Lipid panel and hepatic panel today.   4.  Hypothyroidism, has been stable on levothyroxine  50 mcg 4 days a week and 25 mcg 3 days a week. Monitor TSH in about 6 months.  INTERIM HX: Feeling well.  A couple of different trials of Jardiance  have resulted in nausea, abdominal bloating, and diarrhea.  She has gotten off the medication and feels great now. Fasting glucoses 110-130 range.  ROS --> no fevers, no CP, no SOB, no wheezing, no cough, no dizziness, no HAs, no rashes, no melena/hematochezia.  No polyuria or polydipsia.  No myalgias or arthralgias.  No focal weakness, paresthesias, or tremors.  No acute vision or hearing abnormalities.  No dysuria or unusual/new urinary urgency or frequency.  No recent changes in lower legs. No n/v/d or abd pain.  No palpitations.     Past Medical History:  Diagnosis Date   Corneal dystrophies, hereditary    Diabetes mellitus 10/2010   A1c 6.7%    Diverticulosis    Dry eye syndrome    Hyperlipemia    Hypertension    Hypothyroidism    Left anterior shoulder pain    chronic, suspect biceps tendon etiology   Lumbar spondylosis 05/2016   x-ray   Macular degeneration, bilateral    Osteoarthritis    Presbyopia    Transfusion history 1959   post  partum    Past Surgical History:  Procedure Laterality Date   APPENDECTOMY  1959   CATARACT EXTRACTION, BILATERAL     Dr Camillo   CESAREAN SECTION  [phone]; 1959   COLECTOMY     18 inches removed 2002-2003 for Diverticulitis   COLONOSCOPY     Tics,Dr Sam [place]--pt declines any further screening as of 12/2015.   DEXA  06/30/2016;10/13/19   2017 T score 2.5.  Rpt 2020 NORMAL.  01/2022 T score -1.2   VAGINAL HYSTERECTOMY  1975   For Fibroids (no BSO)    Outpatient Medications Prior to Visit  Medication Sig Dispense Refill   atorvastatin  (LIPITOR) 40 MG tablet Take 1 tablet (40 mg total) by mouth daily. 90 tablet 3   blood glucose meter kit and supplies KIT Use to check blood sugar once daily. 1 each 0   cycloSPORINE (RESTASIS) 0.05 % ophthalmic emulsion Place 1 drop into both eyes 2 (two) times daily.     glucose blood (ONETOUCH VERIO) test strip Use to check glucose twice daily 200 each 5   hydrochlorothiazide  (HYDRODIURIL ) 25 MG tablet Take 1 tablet (25 mg total) by mouth daily. 90 tablet 3   Lancets (ONETOUCH DELICA PLUS LANCET33G) MISC Apply topically daily. Use to check blood sugar once daily.     levothyroxine  (SYNTHROID ) 50 MCG tablet TAKE 1 TABLET EVERY DAY EXCEPT TAKE 1/2 TABLET ON TUESDAY, THURSDAY, AND SATURDAY 72 tablet 3   ramipril  (ALTACE ) 5  MG capsule Take 1 capsule (5 mg total) by mouth daily. 90 capsule 3   metroNIDAZOLE  (METROCREAM ) 0.75 % cream Apply topically 2 (two) times daily. (Patient not taking: Reported on 09/07/2024) 45 g 6   empagliflozin  (JARDIANCE ) 25 MG TABS tablet Take 1 tablet (25 mg total) by mouth daily before breakfast. (Patient not taking: Reported on 09/07/2024) 90 tablet 3   No facility-administered medications prior to visit.    Allergies  Allergen Reactions   Doxycycline  Hyclate Diarrhea   Penicillins     Rash Because of a history of documented adverse serious drug reaction;Medi Alert bracelet  is recommended   Tetanus Toxoid     REACTION: Arm  swelling-severe Because of a history of documented adverse serious drug reaction;Medi Alert bracelet  is recommended   Jardiance  [Empagliflozin ] Other (See Comments)    Nausea, bloating, and diarrhea   Metformin  And Related Nausea And Vomiting and Other (See Comments)    Abdominal pain   Pioglitazone  Other (See Comments)    Weight gain    Review of Systems As per HPI  PE:    09/07/2024   10:59 AM 06/23/2024   11:14 AM 06/23/2024   11:10 AM  Vitals with BMI  Height 5' 6  5' 6  Weight 156 lbs 6 oz  156 lbs 10 oz  BMI 25.26  25.29  Systolic 122 143 854  Diastolic 73 77 84  Pulse 74  81     Physical Exam  Gen: Alert, well appearing.  Patient is oriented to person, place, time, and situation. AFFECT: pleasant, lucid thought and speech. No further exam today  LABS:  Last CBC Lab Results  Component Value Date   WBC 5.7 02/21/2023   HGB 13.8 02/21/2023   HCT 42.0 02/21/2023   MCV 93.0 02/21/2023   RDW 13.4 02/21/2023   PLT 199.0 02/21/2023   Last metabolic panel Lab Results  Component Value Date   GLUCOSE 119 (H) 05/28/2024   NA 140 05/28/2024   K 4.2 05/28/2024   CL 98 05/28/2024   CO2 34 (H) 05/28/2024   BUN 19 05/28/2024   CREATININE 0.84 05/28/2024   GFR 62.20 05/28/2024   CALCIUM  9.5 05/28/2024   PROT 6.4 05/28/2024   ALBUMIN 4.1 05/28/2024   BILITOT 0.9 05/28/2024   ALKPHOS 61 05/28/2024   AST 20 05/28/2024   ALT 20 05/28/2024   Last lipids Lab Results  Component Value Date   CHOL 162 05/28/2024   HDL 61.70 05/28/2024   LDLCALC 86 05/28/2024   TRIG 73.0 05/28/2024   CHOLHDL 3 05/28/2024   Last hemoglobin A1c Lab Results  Component Value Date   HGBA1C 7.3 (A) 09/07/2024   HGBA1C 7.3 09/07/2024   HGBA1C 7.3 (A) 09/07/2024   HGBA1C 7.3 (A) 09/07/2024   Last thyroid  functions Lab Results  Component Value Date   TSH 0.72 11/26/2023   IMPRESSION AND PLAN:  #1 diabetes without complication.  Not ideal control.  POC Hba1c today is  7.3%. Recent intolerance to Jardiance . Also intolerance to pioglitazone  and metformin  in the past. Will continue with diet only at this point.  If A1c rising again at next check in 3 months will consider starting low-dose sulfonylurea versus GLP-1 agonist. Monitor renal function today.  2.  Essential hypertension, well-controlled on HCTZ 25 mg a day and ramipril  5 mg a day.  Electrolytes and creatinine monitoring today.  3.  Hypercholesterolemia, doing well on atorvastatin  40 mg a day. LDL was 86 about 3 months ago.  Lipid panel and hepatic panel in 3 months.   4.  Hypothyroidism, has been stable on levothyroxine  50 mcg 4 days a week and 25 mcg 3 days a week. Monitor TSH in 3 months.  An After Visit Summary was printed and given to the patient.  FOLLOW UP: Return in about 3 months (around 12/08/2024) for routine chronic illness f/u.  Signed:  Gerlene Hockey, MD           09/07/2024

## 2024-09-08 ENCOUNTER — Ambulatory Visit: Payer: Self-pay | Admitting: Family Medicine

## 2024-09-21 ENCOUNTER — Telehealth: Payer: Self-pay

## 2024-09-21 ENCOUNTER — Encounter: Payer: Self-pay | Admitting: Family Medicine

## 2024-09-21 NOTE — Telephone Encounter (Signed)
Noted. EMR updated. 

## 2024-09-21 NOTE — Telephone Encounter (Signed)
 Patient called office was transferred to me. Patient wanted to let us  know that she thinks she had a reaction to shingles vaccine, she rec'd at pharmacy on Saturday.  Started running fever, very tired, feels awful; arm where vaccine was administered is very sore. She is wanting us  to document in her chart.  She can be reached at [phone]

## 2024-09-23 ENCOUNTER — Ambulatory Visit: Payer: Self-pay

## 2024-09-23 NOTE — Telephone Encounter (Signed)
 FYI Only or Action Required?: Action required by provider: request for appointment.  Patient was last seen in primary care on 09/07/2024 by McGowen, Aleene DEL, MD.  Called Nurse Triage reporting Allergic Reaction.  Symptoms began today.  Interventions attempted: Nothing.  Symptoms are: unchanged.  Triage Disposition: No disposition on file.  Patient/caregiver understands and will follow disposition?: No, wishes to speak with PCP   Reason for Disposition  [1] Measles vaccine rash (onset day 6-12) AND [2] persists > 3 days  Answer Assessment - Initial Assessment Questions No available appts with pcp. Offered alternative provider, patient declines.  Patient requesting appt tomorrow with Dr. McGowen only.   Advised call back or UC/ED if symptoms worsen.  Red flat spot, size silver dollar on belly, nauseous  Denies painful, pain at injections site, difficulty breathing  1. SYMPTOMS: What is the main symptom? (e.g., pain, redness, or swelling at injection site; feeling tired, fever, muscle aches)      Fever, chills Saturday and temp 101.5, treated with tylnenol Denies fever, chills, vomiting, dizziness, diff breathing 2. ONSET: When was the vaccine (shot) given? How much later did the  begin? (e.g., hours, days ago)      Saturday got vaccine, notice spot topdy 3. SEVERITY: How bad is it?      Moderate itching; right side  4. FEVER: Do you have a fever? If Yes, ask: What is your temperature, how was it measured, and when did it start?       5. IMMUNIZATIONS GIVEN: What shots have you recently received?  Protocols used: Immunization [employer] from [place] [facility] sent at 09/23/2024  1:16 PM EST  Reason for Triage: Patient called in stated she had gotten the shingles vaccine a couple of days ago is having a reaction to it. Would like to know what she needs to do regarding this

## 2024-09-23 NOTE — Telephone Encounter (Signed)
 Please Advise

## 2024-09-24 ENCOUNTER — Encounter: Payer: Self-pay | Admitting: Family Medicine

## 2024-09-24 ENCOUNTER — Ambulatory Visit (INDEPENDENT_AMBULATORY_CARE_PROVIDER_SITE_OTHER): Admitting: Family Medicine

## 2024-09-24 VITALS — BP 123/54 | HR 79 | Temp 97.9°F | Wt 158.4 lb

## 2024-09-24 DIAGNOSIS — T50Z95A Adverse effect of other vaccines and biological substances, initial encounter: Secondary | ICD-10-CM | POA: Diagnosis not present

## 2024-09-24 DIAGNOSIS — L304 Erythema intertrigo: Secondary | ICD-10-CM | POA: Diagnosis not present

## 2024-09-24 NOTE — Telephone Encounter (Signed)
 See if she can come in between my 2  and 2:40 pt's today

## 2024-09-24 NOTE — Telephone Encounter (Signed)
Spoke with patient regarding recommendations 

## 2024-09-24 NOTE — Progress Notes (Signed)
 OFFICE VISIT  09/24/2024  CC:  Chief Complaint  Patient presents with   Vaccine Reaction    Pt received shingles vax recently; c/o nausea, chills and fever Sat; itchy and redness, stings; Tylenol and Neosporin use   Patient is a 89 y.o. female who presents for recent vaccine reaction.  HPI: She got shingles vaccine 5 days ago.  The day after she developed temperature to 101, severe fatigue, and bodyaches. This cleared up over the subsequent 2 days.  She now feels back to normal but she has developed painful spot on the skin of the right lower abdomen.  She has been applying Neosporin and wiping it with alcohol.   Past Medical History:  Diagnosis Date   Adverse reaction to vaccine    SHINGRIX-->flu-like illness   Corneal dystrophies, hereditary    Diabetes mellitus 10/2010   A1c 6.7%    Diverticulosis    Dry eye syndrome    Hyperlipemia    Hypertension    Hypothyroidism    Left anterior shoulder pain    chronic, suspect biceps tendon etiology   Lumbar spondylosis 05/2016   x-ray   Macular degeneration, bilateral    Osteoarthritis    Presbyopia    Transfusion history 1959   post partum    Past Surgical History:  Procedure Laterality Date   APPENDECTOMY  1959   CATARACT EXTRACTION, BILATERAL     Dr Camillo   CESAREAN SECTION  [phone]; 1959   COLECTOMY     18 inches removed 2002-2003 for Diverticulitis   COLONOSCOPY     Tics,Dr Sam [place]--pt declines any further screening as of 12/2015.   DEXA  06/30/2016;10/13/19   2017 T score 2.5.  Rpt 2020 NORMAL.  01/2022 T score -1.2   VAGINAL HYSTERECTOMY  1975   For Fibroids (no BSO)    Outpatient Medications Prior to Visit  Medication Sig Dispense Refill   atorvastatin  (LIPITOR) 40 MG tablet Take 1 tablet (40 mg total) by mouth daily. 90 tablet 3   blood glucose meter kit and supplies KIT Use to check blood sugar once daily. 1 each 0   cycloSPORINE (RESTASIS) 0.05 % ophthalmic emulsion Place 1 drop into both eyes 2 (two)  times daily.     glucose blood (ONETOUCH VERIO) test strip Use to check glucose twice daily 200 each 5   hydrochlorothiazide  (HYDRODIURIL ) 25 MG tablet Take 1 tablet (25 mg total) by mouth daily. 90 tablet 3   Lancets (ONETOUCH DELICA PLUS LANCET33G) MISC Apply topically daily. Use to check blood sugar once daily.     levothyroxine  (SYNTHROID ) 50 MCG tablet TAKE 1 TABLET EVERY DAY EXCEPT TAKE 1/2 TABLET ON TUESDAY, THURSDAY, AND SATURDAY 72 tablet 3   ramipril  (ALTACE ) 5 MG capsule Take 1 capsule (5 mg total) by mouth daily. 90 capsule 3   metroNIDAZOLE  (METROCREAM ) 0.75 % cream Apply topically 2 (two) times daily. (Patient not taking: Reported on 09/24/2024) 45 g 6   No facility-administered medications prior to visit.    Allergies  Allergen Reactions   Doxycycline  Hyclate Diarrhea   Penicillins     Rash Because of a history of documented adverse serious drug reaction;Medi Alert bracelet  is recommended   Tetanus Toxoid     REACTION: Arm swelling-severe Because of a history of documented adverse serious drug reaction;Medi Alert bracelet  is recommended   Jardiance  [Empagliflozin ] Other (See Comments)    Nausea, bloating, and diarrhea   Metformin  And Related Nausea And Vomiting and Other (See Comments)  Abdominal pain   Pioglitazone  Other (See Comments)    Weight gain    Review of Systems  As per HPI  PE:    09/24/2024    2:13 PM 09/07/2024   10:59 AM 06/23/2024   11:14 AM  Vitals with BMI  Height  5' 6   Weight 158 lbs 6 oz 156 lbs 6 oz   BMI 25.58 25.26   Systolic 123 122 856  Diastolic 54 73 77  Pulse 79 74     Physical Exam  Gen: Alert, well appearing.  Patient is oriented to person, place, time, and situation. AFFECT: pleasant, lucid thought and speech. Just her abdominal pannus there is a irregular shaped pink splotch of macular rash about the size of a golf ball.  Irregular borders.  No vesicles, pustules, or ulcerations.  LABS:  None  IMPRESSION AND  PLAN:  #1 vaccine reaction (Shingrix). Resolved.  #2 intertrigo. I think she had excessive sweating when she had her fevers and this develops. I just recommended she stop the alcohol wipes and Neosporin.  She can apply Lamisil twice a day.  An After Visit Summary was printed and given to the patient.  FOLLOW UP: Return for keep appt with me set for Feb 2026.  Signed:  Gerlene Hockey, MD           09/24/2024

## 2024-10-14 ENCOUNTER — Ambulatory Visit (INDEPENDENT_AMBULATORY_CARE_PROVIDER_SITE_OTHER): Admitting: *Deleted

## 2024-10-14 VITALS — Ht 66.0 in | Wt 158.0 lb

## 2024-10-14 DIAGNOSIS — Z0000 Encounter for general adult medical examination without abnormal findings: Secondary | ICD-10-CM | POA: Diagnosis not present

## 2024-10-14 NOTE — Progress Notes (Signed)
 Chief Complaint  Patient presents with   Medicare Wellness     Subjective:   Shannon Greene is a 89 y.o. female who presents for a Medicare Annual Wellness Visit.  No voiced or noted concerns at this time Patient advised to keep follow-up appointment with PCP (12-09-2023) HM Addressed: Addressed all    Visit info / Clinical Intake: Medicare Wellness Visit Type:: Subsequent Annual Wellness Visit Persons participating in visit and providing information:: patient Medicare Wellness Visit Mode:: Telephone If telephone:: video declined Since this visit was completed virtually, some vitals may be partially provided or unavailable. Missing vitals are due to the limitations of the virtual format.: Unable to obtain vitals - no equipment If Telephone or Video please confirm:: I connected with patient using audio/video enable telemedicine. I verified patient identity with two identifiers, discussed telehealth limitations, and patient agreed to proceed. Patient Location:: home Provider Location:: home Interpreter Needed?: No Pre-visit prep was completed: no AWV questionnaire completed by patient prior to visit?: no Living arrangements:: (!) lives alone Patient's Overall Health Status Rating: good Typical amount of pain: none Does pain affect daily life?: no Are you currently prescribed opioids?: no  Dietary Habits and Nutritional Risks How many meals a day?: 3 Eats fruit and vegetables daily?: yes Most meals are obtained by: preparing own meals In the last 2 weeks, have you had any of the following?: none Diabetic:: (!) yes Any non-healing wounds?: (!) yes How often do you check your BS?: 1 Would you like to be referred to a Nutritionist or for Diabetic Management? : no  Functional Status Activities of Daily Living (to include ambulation/medication): Independent Ambulation: Independent Medication Administration: Independent Home Management (perform basic housework or laundry):  Independent Manage your own finances?: yes Primary transportation is: driving Concerns about vision?: no *vision screening is required for WTM* Concerns about hearing?: no  Fall Screening Falls in the past year?: 0 Number of falls in past year: 0 Was there an injury with Fall?: 0 Fall Risk Category Calculator: 0 Patient Fall Risk Level: Low Fall Risk  Fall Risk Patient at Risk for Falls Due to: No Fall Risks Fall risk Follow up: Falls evaluation completed; Education provided; Falls prevention discussed  Home and Transportation Safety: All rugs have non-skid backing?: yes All stairs or steps have railings?: N/A, no stairs Grab bars in the bathtub or shower?: yes Have non-skid surface in bathtub or shower?: yes Good home lighting?: yes Regular seat belt use?: yes Hospital stays in the last year:: no  Cognitive Assessment Difficulty concentrating, remembering, or making decisions? : no Will 6CIT or Mini Cog be Completed: yes What year is it?: 0 points What month is it?: 0 points Give patient an address phrase to remember (5 components): Its very sunny outside today in December About what time is it?: 0 points Count backwards from 20 to 1: 0 points Say the months of the year in reverse: 0 points Repeat the address phrase from earlier: 0 points 6 CIT Score: 0 points  Advance Directives (For Healthcare) Does Patient Have a Medical Advance Directive?: Yes Type of Advance Directive: Healthcare Power of Attorney Copy of Healthcare Power of Attorney in Chart?: No - copy requested  Reviewed/Updated  Reviewed/Updated: Reviewed All (Medical, Surgical, Family, Medications, Allergies, Care Teams, Patient Goals); Surgical History; Family History; Medications; Allergies; Care Teams; Patient Goals; Medical History    Allergies (verified) Doxycycline  hyclate, Penicillins, Tetanus toxoid, Jardiance  [empagliflozin ], Metformin  and related, and Pioglitazone    Current Medications  (verified) Outpatient  Encounter Medications as of 10/14/2024  Medication Sig   atorvastatin  (LIPITOR) 40 MG tablet Take 1 tablet (40 mg total) by mouth daily.   blood glucose meter kit and supplies KIT Use to check blood sugar once daily.   cycloSPORINE (RESTASIS) 0.05 % ophthalmic emulsion Place 1 drop into both eyes 2 (two) times daily.   glucose blood (ONETOUCH VERIO) test strip Use to check glucose twice daily   hydrochlorothiazide  (HYDRODIURIL ) 25 MG tablet Take 1 tablet (25 mg total) by mouth daily.   Lancets (ONETOUCH DELICA PLUS LANCET33G) MISC Apply topically daily. Use to check blood sugar once daily.   levothyroxine  (SYNTHROID ) 50 MCG tablet TAKE 1 TABLET EVERY DAY EXCEPT TAKE 1/2 TABLET ON TUESDAY, THURSDAY, AND SATURDAY   ramipril  (ALTACE ) 5 MG capsule Take 1 capsule (5 mg total) by mouth daily.   metroNIDAZOLE  (METROCREAM ) 0.75 % cream Apply topically 2 (two) times daily. (Patient not taking: Reported on 10/14/2024)   No facility-administered encounter medications on file as of 10/14/2024.    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   DEXA  06/30/2016;10/13/19   2017 T score 2.5.  Rpt 2020 NORMAL.  01/2022 T score -1.2    VAGINAL HYSTERECTOMY  1975   For Fibroids (no BSO)   Family History  Problem Relation Age of Onset   Heart attack Mother 61   Hypertension Mother    Tuberculosis Father    Diabetes Brother    Stroke Brother 62   Prostate cancer Brother    Breast cancer Neg Hx    Social History   Occupational History   Not on file  Tobacco Use   Smoking status: Never   Smokeless tobacco: Never  Vaping Use   Vaping status: Never Used  Substance and Sexual Activity   Alcohol use: No   Drug use: No   Sexual activity: Not Currently   Tobacco Counseling Counseling given: Not Answered  SDOH Screenings   Food Insecurity: No Food Insecurity (10/14/2024)  Housing: Unknown (10/14/2024)  Transportation Needs: No Transportation Needs (10/14/2024)  Utilities: Not At Risk (10/14/2024)  Alcohol Screen: Low Risk  (10/09/2023)  Depression (PHQ2-9): Low Risk  (10/14/2024)  Financial Resource Strain: Low Risk  (10/09/2023)  Physical Activity: Inactive (10/14/2024)  Social Connections: Socially Isolated (10/14/2024)  Stress: No Stress Concern Present (10/14/2024)  Tobacco Use: Low Risk  (10/14/2024)  Health Literacy: Adequate Health Literacy (10/14/2024)   See flowsheets for full screening details  Depression Screen PHQ 2 & 9 Depression Scale- Over the past 2 weeks, how often have you been bothered by any of the following  problems? Little interest or pleasure in doing things: 0 Feeling down, depressed, or hopeless (PHQ Adolescent also includes...irritable): 0 PHQ-2 Total Score: 0 Trouble falling or staying asleep, or sleeping too much: 2 Feeling tired or having little energy: 0 Poor appetite or overeating (PHQ Adolescent also includes...weight loss): 0 Feeling bad about yourself - or that you are a failure or have let yourself or your family down: 0 Trouble concentrating on things, such as reading the newspaper or watching television (PHQ Adolescent also includes...like school work): 0 Moving or  speaking so slowly that other people could have noticed. Or the opposite - being so fidgety or restless that you have been moving around a lot more than usual: 0 Thoughts that you would be better off dead, or of hurting yourself in some way: 0 PHQ-9 Total Score: 2 If you checked off any problems, how difficult have these problems made it for you to do your work, take care of things at home, or get along with other people?: Not difficult at all     Goals Addressed   None          Objective:    Today's Vitals   10/14/24 1310  Weight: 158 lb (71.7 kg)  Height: 5' 6 (1.676 m)   Body mass index is 25.5 kg/m.  Hearing/Vision screen Hearing Screening - Comments:: No trouble hearing Vision Screening - Comments:: [facility] Immunizations and Health Maintenance Health Maintenance  Topic Date Due   COVID-19 Vaccine (3 - Pfizer risk series) 02/06/2020   FOOT EXAM  11/25/2024   Bone Density Scan  01/22/2025   HEMOGLOBIN A1C  03/07/2025   OPHTHALMOLOGY EXAM  08/05/2025   Medicare Annual Wellness (AWV)  10/14/2025   DTaP/Tdap/Td (2 - Tdap) 12/24/2026   Pneumococcal Vaccine: 50+ Years  Completed   Influenza Vaccine  Completed   Zoster Vaccines- Shingrix  Completed   Meningococcal B Vaccine  Aged Out        Assessment/Plan:  This is a routine wellness examination for Arthea.  Patient Care Team: Candise Aleene DEL, MD as PCP - General (Family Medicine) Camillo Golas, MD as Consulting Physician (Ophthalmology)  I have personally reviewed and noted the following in the patients chart:   Medical and social history Use of alcohol, tobacco or illicit drugs  Current medications and supplements including opioid prescriptions. Functional ability and status Nutritional status Physical activity Advanced directives List of other physicians Hospitalizations, surgeries, and ER visits in previous 12 months Vitals Screenings to include cognitive, depression, and falls Referrals  and appointments  No orders of the defined types were placed in this encounter.  In addition, I have reviewed and discussed with patient certain preventive protocols, quality metrics, and best practice recommendations. A written personalized care plan for preventive services as well as general preventive health recommendations were provided to patient.   Mliss Graff, LPN   [id number]   Return in 1 year (on 10/14/2025).  After Visit Summary: (MyChart) Due to this being a telephonic visit, the after visit summary with patients personalized plan was offered to patient via MyChart   Nurse Notes:

## 2024-10-14 NOTE — Patient Instructions (Signed)
 Shannon Greene,  Thank you for taking the time for your Medicare Wellness Visit. I appreciate your continued commitment to your health goals. Please review the care plan we discussed, and feel free to reach out if I can assist you further.  Please note that Annual Wellness Visits do not include a physical exam. Some assessments may be limited, especially if the visit was conducted virtually. If needed, we may recommend an in-person follow-up with your provider.  Ongoing Care Seeing your primary care provider every 3 to 6 months helps us  monitor your health and provide consistent, personalized care.   Referrals If a referral was made during today's visit and you haven't received any updates within two weeks, please contact the referred provider directly to check on the status.  Recommended Screenings:  Health Maintenance  Topic Date Due   COVID-19 Vaccine (3 - Pfizer risk series) 02/06/2020   Complete foot exam   11/25/2024   Osteoporosis screening with Bone Density Scan  01/22/2025   Hemoglobin A1C  03/07/2025   Eye exam for diabetics  08/05/2025   Medicare Annual Wellness Visit  10/14/2025   DTaP/Tdap/Td vaccine (2 - Tdap) 12/24/2026   Pneumococcal Vaccine for age over 75  Completed   Flu Shot  Completed   Zoster (Shingles) Vaccine  Completed   Meningitis B Vaccine  Aged Out       10/14/2024    1:11 PM  Advanced Directives  Does Patient Have a Medical Advance Directive? Yes  Type of Advance Directive Healthcare Power of Attorney  Copy of Healthcare Power of Attorney in Chart? No - copy requested    Vision: Annual vision screenings are recommended for early detection of glaucoma, cataracts, and diabetic retinopathy. These exams can also reveal signs of chronic conditions such as diabetes and high blood pressure.  Dental: Annual dental screenings help detect early signs of oral cancer, gum disease, and other conditions linked to overall health, including heart disease and  diabetes.  Please see the attached documents for additional preventive care recommendations.    Shannon Greene , Thank you for taking time to come for your Medicare Wellness Visit. I appreciate your ongoing commitment to your health goals. Please review the following plan we discussed and let me know if I can assist you in the future.   Screening recommendations/referrals: Colonoscopy:  Mammogram:  Bone Density:  Recommended yearly ophthalmology/optometry visit for glaucoma screening and checkup Recommended yearly dental visit for hygiene and checkup  Vaccinations: Influenza vaccine:  Pneumococcal vaccine:  Tdap vaccine:  Shingles vaccine:         Preventive Care 65 Years and Older, Female Preventive care refers to lifestyle choices and visits with your health care provider that can promote health and wellness. What does preventive care include? A yearly physical exam. This is also called an annual well check. Dental exams once or twice a year. Routine eye exams. Ask your health care provider how often you should have your eyes checked. Personal lifestyle choices, including: Daily care of your teeth and gums. Regular physical activity. Eating a healthy diet. Avoiding tobacco and drug use. Limiting alcohol use. Practicing safe sex. Taking low-dose aspirin every day. Taking vitamin and mineral supplements as recommended by your health care provider. What happens during an annual well check? The services and screenings done by your health care provider during your annual well check will depend on your age, overall health, lifestyle risk factors, and family history of disease. Counseling  Your health care provider may  ask you questions about your: Alcohol use. Tobacco use. Drug use. Emotional well-being. Home and relationship well-being. Sexual activity. Eating habits. History of falls. Memory and ability to understand (cognition). Work and work astronomer. Reproductive  health. Screening  You may have the following tests or measurements: Height, weight, and BMI. Blood pressure. Lipid and cholesterol levels. These may be checked every 5 years, or more frequently if you are over 23 years old. Skin check. Lung cancer screening. You may have this screening every year starting at age 65 if you have a 30-pack-year history of smoking and currently smoke or have quit within the past 15 years. Fecal occult blood test (FOBT) of the stool. You may have this test every year starting at age 21. Flexible sigmoidoscopy or colonoscopy. You may have a sigmoidoscopy every 5 years or a colonoscopy every 10 years starting at age 62. Hepatitis C blood test. Hepatitis B blood test. Sexually transmitted disease (STD) testing. Diabetes screening. This is done by checking your blood sugar (glucose) after you have not eaten for a while (fasting). You may have this done every 1-3 years. Bone density scan. This is done to screen for osteoporosis. You may have this done starting at age 10. Mammogram. This may be done every 1-2 years. Talk to your health care provider about how often you should have regular mammograms. Talk with your health care provider about your test results, treatment options, and if necessary, the need for more tests. Vaccines  Your health care provider may recommend certain vaccines, such as: Influenza vaccine. This is recommended every year. Tetanus, diphtheria, and acellular pertussis (Tdap, Td) vaccine. You may need a Td booster every 10 years. Zoster vaccine. You may need this after age 55. Pneumococcal 13-valent conjugate (PCV13) vaccine. One dose is recommended after age 64. Pneumococcal polysaccharide (PPSV23) vaccine. One dose is recommended after age 47. Talk to your health care provider about which screenings and vaccines you need and how often you need them. This information is not intended to replace advice given to you by your health care provider.  Make sure you discuss any questions you have with your health care provider. Document Released: 11/18/2015 Document Revised: 07/11/2016 Document Reviewed: 08/23/2015 Elsevier Interactive Patient Education  2017 [employer].  Fall Prevention in the Home Falls can cause injuries. They can happen to people of all ages. There are many things you can do to make your home safe and to help prevent falls. What can I do on the outside of my home? Regularly fix the edges of walkways and driveways and fix any cracks. Remove anything that might make you trip as you walk through a door, such as a raised step or threshold. Trim any bushes or trees on the path to your home. Use bright outdoor lighting. Clear any walking paths of anything that might make someone trip, such as rocks or tools. Regularly check to see if handrails are loose or broken. Make sure that both sides of any steps have handrails. Any raised decks and porches should have guardrails on the edges. Have any leaves, snow, or ice cleared regularly. Use sand or salt on walking paths during winter. Clean up any spills in your garage right away. This includes oil or grease spills. What can I do in the bathroom? Use night lights. Install grab bars by the toilet and in the tub and shower. Do not use towel bars as grab bars. Use non-skid mats or decals in the tub or shower. If you need  to sit down in the shower, use a plastic, non-slip stool. Keep the floor dry. Clean up any water that spills on the floor as soon as it happens. Remove soap buildup in the tub or shower regularly. Attach bath mats securely with double-sided non-slip rug tape. Do not have throw rugs and other things on the floor that can make you trip. What can I do in the bedroom? Use night lights. Make sure that you have a light by your bed that is easy to reach. Do not use any sheets or blankets that are too big for your bed. They should not hang down onto the floor. Have a  firm chair that has side arms. You can use this for support while you get dressed. Do not have throw rugs and other things on the floor that can make you trip. What can I do in the kitchen? Clean up any spills right away. Avoid walking on wet floors. Keep items that you use a lot in easy-to-reach places. If you need to reach something above you, use a strong step stool that has a grab bar. Keep electrical cords out of the way. Do not use floor polish or wax that makes floors slippery. If you must use wax, use non-skid floor wax. Do not have throw rugs and other things on the floor that can make you trip. What can I do with my stairs? Do not leave any items on the stairs. Make sure that there are handrails on both sides of the stairs and use them. Fix handrails that are broken or loose. Make sure that handrails are as long as the stairways. Check any carpeting to make sure that it is firmly attached to the stairs. Fix any carpet that is loose or worn. Avoid having throw rugs at the top or bottom of the stairs. If you do have throw rugs, attach them to the floor with carpet tape. Make sure that you have a light switch at the top of the stairs and the bottom of the stairs. If you do not have them, ask someone to add them for you. What else can I do to help prevent falls? Wear shoes that: Do not have high heels. Have rubber bottoms. Are comfortable and fit you well. Are closed at the toe. Do not wear sandals. If you use a stepladder: Make sure that it is fully opened. Do not climb a closed stepladder. Make sure that both sides of the stepladder are locked into place. Ask someone to hold it for you, if possible. Clearly mark and make sure that you can see: Any grab bars or handrails. First and last steps. Where the edge of each step is. Use tools that help you move around (mobility aids) if they are needed. These include: Canes. Walkers. Scooters. Crutches. Turn on the lights when you  go into a dark area. Replace any light bulbs as soon as they burn out. Set up your furniture so you have a clear path. Avoid moving your furniture around. If any of your floors are uneven, fix them. If there are any pets around you, be aware of where they are. Review your medicines with your doctor. Some medicines can make you feel dizzy. This can increase your chance of falling. Ask your doctor what other things that you can do to help prevent falls. This information is not intended to replace advice given to you by your health care provider. Make sure you discuss any questions you have with your health  care provider. Document Released: 08/18/2009 Document Revised: 03/29/2016 Document Reviewed: 11/26/2014 Elsevier Interactive Patient Education  2017 [employer].

## 2024-12-08 ENCOUNTER — Ambulatory Visit: Admitting: Family Medicine

## 2024-12-08 DIAGNOSIS — E119 Type 2 diabetes mellitus without complications: Secondary | ICD-10-CM

## 2024-12-21 ENCOUNTER — Ambulatory Visit: Admitting: Family Medicine
